# Patient Record
Sex: Female | Born: 1970
Health system: Southern US, Community
[De-identification: ages and names within clinical notes are randomized; demographics above are authoritative.]

## PROBLEM LIST (undated history)

## (undated) DIAGNOSIS — I10 Essential (primary) hypertension: Secondary | ICD-10-CM

## (undated) DIAGNOSIS — J45909 Unspecified asthma, uncomplicated: Secondary | ICD-10-CM

## (undated) DIAGNOSIS — J309 Allergic rhinitis, unspecified: Secondary | ICD-10-CM

## (undated) DIAGNOSIS — D509 Iron deficiency anemia, unspecified: Secondary | ICD-10-CM

## (undated) DIAGNOSIS — E876 Hypokalemia: Secondary | ICD-10-CM

## (undated) DIAGNOSIS — E7800 Pure hypercholesterolemia, unspecified: Secondary | ICD-10-CM

## (undated) DIAGNOSIS — E119 Type 2 diabetes mellitus without complications: Secondary | ICD-10-CM

## (undated) HISTORY — DX: Iron deficiency anemia, unspecified: D50.9

## (undated) HISTORY — DX: Type 2 diabetes mellitus without complications: E11.9

## (undated) HISTORY — DX: Hypokalemia: E87.6

## (undated) HISTORY — DX: Unspecified asthma, uncomplicated: J45.909

## (undated) HISTORY — DX: Allergic rhinitis, unspecified: J30.9

---
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
## 1994-08-12 HISTORY — PX: TUBAL LIGATION: SHX77

## 1998-06-13 ENCOUNTER — Encounter: Payer: Self-pay | Admitting: Emergency Medicine

## 1998-06-13 ENCOUNTER — Emergency Department (HOSPITAL_COMMUNITY): Admission: EM | Admit: 1998-06-13 | Discharge: 1998-06-13 | Payer: Self-pay | Admitting: Emergency Medicine

## 1998-08-12 ENCOUNTER — Emergency Department (HOSPITAL_COMMUNITY): Admission: EM | Admit: 1998-08-12 | Discharge: 1998-08-12 | Payer: Self-pay | Admitting: Emergency Medicine

## 2000-03-21 ENCOUNTER — Other Ambulatory Visit: Admission: RE | Admit: 2000-03-21 | Discharge: 2000-03-21 | Payer: Self-pay | Admitting: Internal Medicine

## 2001-04-01 ENCOUNTER — Other Ambulatory Visit: Admission: RE | Admit: 2001-04-01 | Discharge: 2001-04-01 | Payer: Self-pay | Admitting: Cardiology

## 2002-04-22 ENCOUNTER — Other Ambulatory Visit: Admission: RE | Admit: 2002-04-22 | Discharge: 2002-04-22 | Payer: Self-pay | Admitting: Internal Medicine

## 2003-10-03 ENCOUNTER — Other Ambulatory Visit: Admission: RE | Admit: 2003-10-03 | Discharge: 2003-10-03 | Payer: Self-pay | Admitting: Internal Medicine

## 2004-11-03 ENCOUNTER — Emergency Department (HOSPITAL_COMMUNITY): Admission: EM | Admit: 2004-11-03 | Discharge: 2004-11-04 | Payer: Self-pay | Admitting: Emergency Medicine

## 2004-11-07 ENCOUNTER — Other Ambulatory Visit: Admission: RE | Admit: 2004-11-07 | Discharge: 2004-11-07 | Payer: Self-pay | Admitting: Internal Medicine

## 2004-11-16 ENCOUNTER — Emergency Department (HOSPITAL_COMMUNITY): Admission: EM | Admit: 2004-11-16 | Discharge: 2004-11-16 | Payer: Self-pay | Admitting: Family Medicine

## 2005-12-06 ENCOUNTER — Other Ambulatory Visit: Admission: RE | Admit: 2005-12-06 | Discharge: 2005-12-06 | Payer: Self-pay | Admitting: Internal Medicine

## 2007-02-24 ENCOUNTER — Other Ambulatory Visit: Admission: RE | Admit: 2007-02-24 | Discharge: 2007-02-24 | Payer: Self-pay | Admitting: Internal Medicine

## 2007-05-09 ENCOUNTER — Emergency Department (HOSPITAL_COMMUNITY): Admission: EM | Admit: 2007-05-09 | Discharge: 2007-05-09 | Payer: Self-pay | Admitting: Emergency Medicine

## 2009-02-05 ENCOUNTER — Emergency Department (HOSPITAL_COMMUNITY): Admission: EM | Admit: 2009-02-05 | Discharge: 2009-02-05 | Payer: Self-pay | Admitting: Family Medicine

## 2009-02-11 ENCOUNTER — Emergency Department (HOSPITAL_COMMUNITY): Admission: EM | Admit: 2009-02-11 | Discharge: 2009-02-11 | Payer: Self-pay | Admitting: Emergency Medicine

## 2009-03-03 ENCOUNTER — Ambulatory Visit (HOSPITAL_COMMUNITY): Admission: RE | Admit: 2009-03-03 | Discharge: 2009-03-03 | Payer: Self-pay | Admitting: Internal Medicine

## 2009-11-10 ENCOUNTER — Other Ambulatory Visit: Admission: RE | Admit: 2009-11-10 | Discharge: 2009-11-10 | Payer: Self-pay | Admitting: Internal Medicine

## 2009-11-28 ENCOUNTER — Ambulatory Visit (HOSPITAL_COMMUNITY): Admission: RE | Admit: 2009-11-28 | Discharge: 2009-11-28 | Payer: Self-pay | Admitting: Internal Medicine

## 2009-12-01 ENCOUNTER — Emergency Department (HOSPITAL_COMMUNITY): Admission: EM | Admit: 2009-12-01 | Discharge: 2009-12-01 | Payer: Self-pay | Admitting: Emergency Medicine

## 2010-11-18 LAB — URINALYSIS, ROUTINE W REFLEX MICROSCOPIC
Bilirubin Urine: NEGATIVE
Glucose, UA: NEGATIVE mg/dL
Hgb urine dipstick: NEGATIVE
Ketones, ur: NEGATIVE mg/dL
Nitrite: NEGATIVE
Protein, ur: NEGATIVE mg/dL
Specific Gravity, Urine: 1.028 (ref 1.005–1.030)
Urobilinogen, UA: 1 mg/dL (ref 0.0–1.0)
pH: 7 (ref 5.0–8.0)

## 2011-05-23 LAB — CULTURE, ROUTINE-ABSCESS

## 2012-01-11 ENCOUNTER — Encounter (HOSPITAL_COMMUNITY): Payer: Self-pay

## 2012-01-11 ENCOUNTER — Emergency Department (HOSPITAL_COMMUNITY): Payer: Self-pay

## 2012-01-11 ENCOUNTER — Emergency Department (HOSPITAL_COMMUNITY)
Admission: EM | Admit: 2012-01-11 | Discharge: 2012-01-11 | Disposition: A | Discharge status: Home / Self Care | Payer: Self-pay | Attending: Emergency Medicine | Admitting: Emergency Medicine

## 2012-01-11 DIAGNOSIS — R079 Chest pain, unspecified: Secondary | ICD-10-CM | POA: Insufficient documentation

## 2012-01-11 DIAGNOSIS — R0789 Other chest pain: Secondary | ICD-10-CM

## 2012-01-11 DIAGNOSIS — E7800 Pure hypercholesterolemia, unspecified: Secondary | ICD-10-CM | POA: Insufficient documentation

## 2012-01-11 DIAGNOSIS — M79609 Pain in unspecified limb: Secondary | ICD-10-CM | POA: Insufficient documentation

## 2012-01-11 DIAGNOSIS — I10 Essential (primary) hypertension: Secondary | ICD-10-CM | POA: Insufficient documentation

## 2012-01-11 DIAGNOSIS — K219 Gastro-esophageal reflux disease without esophagitis: Secondary | ICD-10-CM | POA: Insufficient documentation

## 2012-01-11 HISTORY — DX: Essential (primary) hypertension: I10

## 2012-01-11 HISTORY — DX: Pure hypercholesterolemia, unspecified: E78.00

## 2012-01-11 LAB — BASIC METABOLIC PANEL
CO2: 27 mEq/L (ref 19–32)
Calcium: 9 mg/dL (ref 8.4–10.5)
Chloride: 98 mEq/L (ref 96–112)
Creatinine, Ser: 0.7 mg/dL (ref 0.50–1.10)
GFR calc Af Amer: >90 mL/min (ref >90)
Sodium: 136 mEq/L (ref 135–145)

## 2012-01-11 LAB — DIFFERENTIAL
Basophils Absolute: 0 10*3/uL (ref 0.0–0.1)
Lymphocytes Relative: 44 % (ref 12–46)
Lymphs Abs: 3.2 10*3/uL (ref 0.7–4.0)
Neutro Abs: 3.4 10*3/uL (ref 1.7–7.7)

## 2012-01-11 LAB — CBC
MCV: 76.7 fL — ABNORMAL LOW (ref 78.0–100.0)
Platelets: 269 10*3/uL (ref 150–400)
RBC: 3.91 MIL/uL (ref 3.87–5.11)
RDW: 15.6 % — ABNORMAL HIGH (ref 11.5–15.5)
WBC: 7.2 10*3/uL (ref 4.0–10.5)

## 2012-01-11 LAB — TROPONIN I: Troponin I: <0.3 ng/mL (ref <0.30)

## 2012-01-11 MED ORDER — ESOMEPRAZOLE MAGNESIUM 40 MG PO CPDR: 40.0000 mg | DELAYED_RELEASE_CAPSULE | Freq: Every day | ORAL | Status: DC

## 2012-01-11 MED ORDER — ASPIRIN 325 MG PO TABS
325.0000 mg | ORAL_TABLET | ORAL | Status: AC
  Administered 2012-01-11: 325 mg via ORAL
  Filled 2012-01-11: qty 1

## 2012-01-11 MED ORDER — POTASSIUM CHLORIDE CRYS ER 20 MEQ PO TBCR
40.0000 meq | EXTENDED_RELEASE_TABLET | ORAL | Status: AC
  Administered 2012-01-11: 40 meq via ORAL
  Filled 2012-01-11: qty 1

## 2012-01-11 MED ORDER — GI COCKTAIL ~~LOC~~
30.0000 mL | Freq: Once | ORAL | Status: AC
  Administered 2012-01-11: 30 mL via ORAL
  Filled 2012-01-11: qty 30

## 2012-01-11 NOTE — ED Notes (Signed)
Patient presented to ED with chest pain(central) extending to the left arm from this morning.No change in pain scale from morning.No history of cp.At present looks comfortable.

## 2012-01-11 NOTE — ED Provider Notes (Signed)
History     CSN: [id number]  Arrival date & time 01/11/12  1903   First MD Initiated Contact with Patient 01/11/12 2117      Chief Complaint  Patient presents with  . Chest Pain  . Extremity Pain    (Consider location/radiation/quality/duration/timing/severity/associated sxs/prior treatment) Patient is a 41 y.o. female presenting with chest pain and extremity pain. The history is provided by the patient.  Chest Pain The chest pain began 6 - 12 hours ago. Chest pain occurs constantly. The chest pain is unchanged. Associated with: nothing. At its most intense, the pain is at 7/10. The pain is currently at 7/10. The severity of the pain is mild. The quality of the pain is described as burning. The pain radiates to the left arm. Exacerbated by: nothing. Pertinent negatives for primary symptoms include no fever, no fatigue, no shortness of breath, no cough, no abdominal pain, no nausea, no vomiting and no dizziness. She tried nothing for the symptoms. Risk factors: HTN, HLP.    Extremity Pain Associated symptoms include chest pain. Pertinent negatives include no abdominal pain, congestion, coughing, fatigue, fever, headaches, nausea, neck pain or vomiting.    Past Medical History  Diagnosis Date  . Hypertension   . High cholesterol     No past surgical history on file.  No family history on file.  History  Substance Use Topics  . Smoking status: Never Smoker   . Smokeless tobacco: Not on file  . Alcohol Use: No    OB History    Grav Para Term Preterm Abortions TAB SAB Ect Mult Living                  Review of Systems  Constitutional: Negative for fever and fatigue.  HENT: Negative for congestion, drooling and neck pain.   Eyes: Negative for pain.  Respiratory: Negative for cough and shortness of breath.   Cardiovascular: Positive for chest pain.  Gastrointestinal: Negative for nausea, vomiting, abdominal pain and diarrhea.  Genitourinary: Negative for dysuria and  hematuria.  Musculoskeletal: Negative for back pain and gait problem.  Skin: Negative for color change.  Neurological: Negative for dizziness and headaches.  Hematological: Negative for adenopathy.  Psychiatric/Behavioral: Negative for behavioral problems.  All other systems reviewed and are negative.    Allergies  Review of patient's allergies indicates no known allergies.  Home Medications   Current Outpatient Rx  Name Route Sig Dispense Refill  . BENAZEPRIL-HYDROCHLOROTHIAZIDE 20-25 MG PO TABS Oral Take 1 tablet by mouth daily.    Marland Kitchen ESOMEPRAZOLE MAGNESIUM 40 MG PO CPDR Oral Take 40 mg by mouth daily before breakfast.    . LEVALBUTEROL TARTRATE 45 MCG/ACT IN AERO Inhalation Inhale 2 puffs into the lungs every 4 (four) hours as needed. For shortness of breath    . PRAVASTATIN SODIUM 40 MG PO TABS Oral Take 40 mg by mouth daily.      BP 123/54  Pulse 85  Temp 98.4 F (36.9 C)  Resp 20  SpO2 100%  LMP 01/08/2012  Physical Exam  Constitutional: She is oriented to person, place, and time. She appears well-developed and well-nourished.  HENT:  Head: Normocephalic.  Mouth/Throat: No oropharyngeal exudate.  Eyes: Conjunctivae and EOM are normal. Pupils are equal, round, and reactive to light.  Neck: Normal range of motion. Neck supple.  Cardiovascular: Normal rate, regular rhythm, normal heart sounds and intact distal pulses.  Exam reveals no gallop and no friction rub.   No murmur heard. Pulmonary/Chest: Effort  normal and breath sounds normal. No respiratory distress. She has no wheezes.  Abdominal: Soft. Bowel sounds are normal. There is no tenderness.  Musculoskeletal: Normal range of motion. She exhibits no edema and no tenderness.  Neurological: She is alert and oriented to person, place, and time.  Skin: Skin is warm and dry.  Psychiatric: She has a normal mood and affect. Her behavior is normal.    ED Course  Procedures (including critical care time)  Labs Reviewed   CBC - Abnormal; Notable for the following:    Hemoglobin 9.7 (*)    HCT 30.0 (*)    MCV 76.7 (*)    MCH 24.8 (*)    RDW 15.6 (*)    All other components within normal limits  DIFFERENTIAL  BASIC METABOLIC PANEL  TROPONIN I   No results found.   No diagnosis found.   Date: 01/12/2012  Rate: 90  Rhythm: normal sinus rhythm  QRS Axis: right  Intervals: QT prolonged  ST/T Wave abnormalities: normal  Conduction Disutrbances:none  Narrative Interpretation: No ST or T wave changes cw ischemia  Old EKG Reviewed: unchanged    MDM  10:16 PM 41 y.o. female w HLP, HTN pw retrosternal chest burning radiating to her left arm since this morning. Pt is AFVSS here, appears well on exam. Low suspicion for MI based on clinical exam and RF's. Will get labs, gi cocktail.   Initial troponin negative 12 hrs after onset of pain. Pt had relief w/ gi cocktail, will not pursue delta trop as pt now feeling better and sx likely related to reflux.   12:31 AM:  I have discussed the diagnosis/risks/treatment options with the patient and believe the pt to be eligible for discharge home to follow-up with pcp to discuss reflux and possible future outpt stress test if needed. We also discussed returning to the ED immediately if new or worsening sx occur. We discussed the sx which are most concerning (e.g., worsening pain, sob) that necessitate immediate return. Any new prescriptions provided to the patient are listed below.  New Prescriptions   ESOMEPRAZOLE (NEXIUM) 40 MG CAPSULE    Take 1 capsule (40 mg total) by mouth daily.    Clinical Impression 1. GERD (gastroesophageal reflux disease)   2. Atypical chest pain          Purvis Sheffield, MD 01/12/12 [id number]

## 2012-01-11 NOTE — ED Notes (Signed)
Pt complains of chest pain and left arm pain all day today.

## 2012-01-11 NOTE — Discharge Instructions (Signed)
Chest Pain (Nonspecific) It is often hard to give a specific diagnosis for the cause of chest pain. There is always a chance that your pain could be related to something serious, such as a heart attack or a blood clot in the lungs. You need to follow up with your caregiver for further evaluation. CAUSES   Heartburn.   Pneumonia or bronchitis.   Anxiety or stress.   Inflammation around your heart (pericarditis) or lung (pleuritis or pleurisy).   A blood clot in the lung.   A collapsed lung (pneumothorax). It can develop suddenly on its own (spontaneous pneumothorax) or from injury (trauma) to the chest.   Shingles infection (herpes zoster virus).  The chest wall is composed of bones, muscles, and cartilage. Any of these can be the source of the pain.  The bones can be bruised by injury.   The muscles or cartilage can be strained by coughing or overwork.   The cartilage can be affected by inflammation and become sore (costochondritis).  DIAGNOSIS  Lab tests or other studies, such as X-rays, electrocardiography, stress testing, or cardiac imaging, may be needed to find the cause of your pain.  TREATMENT   Treatment depends on what may be causing your chest pain. Treatment may include:   Acid blockers for heartburn.   Anti-inflammatory medicine.   Pain medicine for inflammatory conditions.   Antibiotics if an infection is present.   You may be advised to change lifestyle habits. This includes stopping smoking and avoiding alcohol, caffeine, and chocolate.   You may be advised to keep your head raised (elevated) when sleeping. This reduces the chance of acid going backward from your stomach into your esophagus.   Most of the time, nonspecific chest pain will improve within 2 to 3 days with rest and mild pain medicine.  HOME CARE INSTRUCTIONS   If antibiotics were prescribed, take your antibiotics as directed. Finish them even if you start to feel better.   For the next few  days, avoid physical activities that bring on chest pain. Continue physical activities as directed.   Do not smoke.   Avoid drinking alcohol.   Only take over-the-counter or prescription medicine for pain, discomfort, or fever as directed by your caregiver.   Follow your caregiver's suggestions for further testing if your chest pain does not go away.   Keep any follow-up appointments you made. If you do not go to an appointment, you could develop lasting (chronic) problems with pain. If there is any problem keeping an appointment, you must call to reschedule.  SEEK MEDICAL CARE IF:   You think you are having problems from the medicine you are taking. Read your medicine instructions carefully.   Your chest pain does not go away, even after treatment.   You develop a rash with blisters on your chest.  SEEK IMMEDIATE MEDICAL CARE IF:   You have increased chest pain or pain that spreads to your arm, neck, jaw, back, or abdomen.   You develop shortness of breath, an increasing cough, or you are coughing up blood.   You have severe back or abdominal pain, feel nauseous, or vomit.   You develop severe weakness, fainting, or chills.   You have a fever.  THIS IS AN EMERGENCY. Do not wait to see if the pain will go away. Get medical help at once. Call your local emergency services (911 in [place] Do not drive yourself to the hospital. MAKE SURE YOU:   Understand these instructions.     Will watch your condition.   Will get help right away if you are not doing well or get worse.  Document Released: 05/08/2005 Document Revised: 07/18/2011 Document Reviewed: 03/03/2008 Exit[employer] Patient Information 2012 [employer]. 

## 2012-01-12 NOTE — ED Provider Notes (Signed)
I saw and evaluated the patient, reviewed the resident's note and I agree with the findings and plan. I saw EKG and agree with residents interpretation. Pt with atypical chest pain which sounds most like GERD and no associated sx.  PERC neg and well appearing.  Normal EKG And enzymes.  Improved with gi cocktail.  Gwyneth Sprout, MD 01/12/12 1351

## 2013-04-09 ENCOUNTER — Encounter: Payer: Self-pay | Admitting: Cardiovascular Disease

## 2013-04-13 ENCOUNTER — Encounter: Payer: Self-pay | Admitting: Gastroenterology

## 2013-04-19 ENCOUNTER — Other Ambulatory Visit: Payer: 59

## 2013-04-19 ENCOUNTER — Encounter: Payer: Self-pay | Admitting: Gastroenterology

## 2013-04-19 ENCOUNTER — Ambulatory Visit (INDEPENDENT_AMBULATORY_CARE_PROVIDER_SITE_OTHER): Payer: 59 | Admitting: Gastroenterology

## 2013-04-19 ENCOUNTER — Encounter: Payer: Self-pay | Admitting: Cardiovascular Disease

## 2013-04-19 ENCOUNTER — Other Ambulatory Visit: Payer: Self-pay | Admitting: *Deleted

## 2013-04-19 VITALS — BP 114/80 | HR 68 | Ht 59.0 in | Wt 199.5 lb

## 2013-04-19 DIAGNOSIS — D509 Iron deficiency anemia, unspecified: Secondary | ICD-10-CM

## 2013-04-19 MED ORDER — FERROUS SULFATE DRIED 200 (65 FE) MG PO TABS: 1.0000 | ORAL_TABLET | Freq: Two times a day (BID) | ORAL | Status: DC

## 2013-04-19 NOTE — Patient Instructions (Signed)
  Your physician has requested that you go to the basement for lab work before leaving today.  Please come back May 19, 2013 for lab work. Lab is open from 7:30 am to 5:30 pm.  Please follow up with Dr. Arlyce Dice in 4-6 weeks. ____________________________________________________________________________________                                               We are excited to introduce MyChart, a new best-in-class service that provides you online access to important information in your electronic medical record. We want to make it easier for you to view your health information - all in one secure location - when and where you need it. We expect MyChart will enhance the quality of care and service we provide.  When you register for MyChart, you can:    View your test results.    Request appointments and receive appointment reminders via email.    Request medication renewals.    View your medical history, allergies, medications and immunizations.    Communicate with your physician's office through a password-protected site.    Conveniently print information such as your medication lists.  To find out if MyChart is right for you, please talk to a member of our clinical staff today. We will gladly answer your questions about this free health and wellness tool.  If you are age 89 or older and want a member of your family to have access to your record, you must provide written consent by completing a proxy form available at our office. Please speak to our clinical staff about guidelines regarding accounts for patients younger than age 39.  As you activate your MyChart account and need any technical assistance, please call the MyChart technical support line at [phone]) or email your question to [email or web address]. If you email your question(s), please include your name, a return phone number and the best time to reach you.  If you have non-urgent health-related  questions, you can send a message to our office through MyChart at [email or web address]. If you have a medical emergency, call 911.  Thank you for using MyChart as your new health and wellness resource!   MyChart licensed from [employer],  [id number]. Patents Pending.

## 2013-04-19 NOTE — Assessment & Plan Note (Addendum)
Iron deficiency anemia is likely from menstrual blood loss.  Chronic GI bleeding should be ruled out.  Microcytic anemia from a hemoglobinopathy is less likely.  Recommendations #1 serial Hemoccults #2 hemoglobin electrophoresis #3 begin iron supplementation #4 CBC one month

## 2013-04-19 NOTE — Progress Notes (Signed)
History of Present Illness: 42 year old Afro-American female referred at the request of Lucky Cowboy, MD for evaluation of anemia.  She's been told to be anemic in the past.  In July, 2014 hemoglobin was 9 and MCV 72; % iron saturation was 6%. Patient has no GI complaints including change of bowel habits, abdominal pain, melena or hematochezia.  She claims that she has heavy periods for the first 2 days of her menstrual..  She believes that other family members were told they were anemic.  She is on no regular gastric irritants including nonsteroidals.  She takes Nexium occasionally for pyrosis.    Past Medical History  Diagnosis Date  . Hypertension   . High cholesterol   . Asthma   . DM (diabetes mellitus)     borderline  . Low blood potassium    Past Surgical History  Procedure Laterality Date  . Cesarean section      x 4  . Umbilical hernia repair     family history includes Breast cancer in her maternal aunt; Diabetes in her maternal aunt and mother; Heart disease in her maternal grandmother. Current Outpatient Prescriptions  Medication Sig Dispense Refill  . benazepril-hydrochlorthiazide (LOTENSIN HCT) 20-25 MG per tablet Take 1 tablet by mouth daily.      . ergocalciferol (VITAMIN D2) 50000 UNITS capsule Take 50,000 Units by mouth. Monday, Wednesday and Friday      . esomeprazole (NEXIUM) 40 MG capsule Take 40 mg by mouth as needed.       . metFORMIN (GLUCOPHAGE) 500 MG tablet Take 500 mg by mouth daily.      . potassium chloride (K-DUR,KLOR-CON) 10 MEQ tablet Take 10 mEq by mouth daily.      . pravastatin (PRAVACHOL) 40 MG tablet Take 40 mg by mouth daily.      Marland Kitchen levalbuterol (XOPENEX HFA) 45 MCG/ACT inhaler Inhale 2 puffs into the lungs every 4 (four) hours as needed. For shortness of breath       No current facility-administered medications for this visit.   Allergies as of 04/19/2013  . (No Known Allergies)    reports that she has never smoked. She has never used  smokeless tobacco. She reports that she does not drink alcohol or use illicit drugs.     Review of Systems: Pertinent positive and negative review of systems were noted in the above HPI section. All other review of systems were otherwise negative.  Vital signs were reviewed in today's medical record Physical Exam: General: She is an obese female in no acute distress Skin: anicteric Head: Normocephalic and atraumatic Eyes:  sclerae anicteric, EOMI Ears: Normal auditory acuity Mouth: No deformity or lesions Neck: Supple, no masses or thyromegaly Lungs: Clear throughout to auscultation Heart: Regular rate and rhythm; no murmurs, rubs or bruits Abdomen: Soft, non tender and non distended. No masses, hepatosplenomegaly or hernias noted. Normal Bowel sounds Rectal:deferred Musculoskeletal: Symmetrical with no gross deformities  Skin: No lesions on visible extremities Pulses:  Normal pulses noted Extremities: No clubbing, cyanosis, edema or deformities noted Neurological: Alert oriented x 4, grossly nonfocal Cervical Nodes:  No significant cervical adenopathy Inguinal Nodes: No significant inguinal adenopathy Psychological:  Alert and cooperative. Normal mood and affect

## 2013-04-19 NOTE — Progress Notes (Signed)
No show for appt. cdm  

## 2013-04-21 LAB — HEMOGLOBINOPATHY EVALUATION
Hgb A2 Quant: 1.2 % — ABNORMAL LOW (ref 2.2–3.2)
Hgb A: 98.8 % — ABNORMAL HIGH (ref 96.8–97.8)
Hgb F Quant: 0 % (ref 0.0–2.0)
Hgb S Quant: 0 %

## 2013-05-04 ENCOUNTER — Encounter: Payer: Self-pay | Admitting: Gastroenterology

## 2013-05-06 ENCOUNTER — Encounter: Payer: Self-pay | Admitting: Cardiovascular Disease

## 2013-05-24 ENCOUNTER — Ambulatory Visit: Payer: 59 | Admitting: Gastroenterology

## 2013-06-15 ENCOUNTER — Encounter: Payer: Self-pay | Admitting: Cardiovascular Disease

## 2013-06-15 ENCOUNTER — Encounter (INDEPENDENT_AMBULATORY_CARE_PROVIDER_SITE_OTHER): Payer: Self-pay

## 2013-06-15 ENCOUNTER — Ambulatory Visit (INDEPENDENT_AMBULATORY_CARE_PROVIDER_SITE_OTHER): Payer: 59 | Admitting: Cardiovascular Disease

## 2013-06-15 VITALS — BP 150/70 | HR 77 | Ht 59.0 in | Wt 202.0 lb

## 2013-06-15 DIAGNOSIS — R0600 Dyspnea, unspecified: Secondary | ICD-10-CM

## 2013-06-15 DIAGNOSIS — R0609 Other forms of dyspnea: Secondary | ICD-10-CM

## 2013-06-15 DIAGNOSIS — R0989 Other specified symptoms and signs involving the circulatory and respiratory systems: Secondary | ICD-10-CM

## 2013-06-15 DIAGNOSIS — D509 Iron deficiency anemia, unspecified: Secondary | ICD-10-CM

## 2013-06-15 NOTE — Progress Notes (Signed)
History of Present Illness: 42yo female with history of HTN, hyperlipidemia, asthma, borderline DM, anemia who is here today as a new patient for evaluation of SOB. She has had recent GI evaluation per Dr. Arlyce Dice with plans for iron supplementation and repeat CBC this month. She tells me that she has noticed dyspnea for several months. This is with exertion. No recent weight gain or lower ext edema. No chest pain. Overall feels well.   Primary Care Physician: Dr. Oneta Rack  Past Medical History  Diagnosis Date  . Hypertension   . High cholesterol   . Asthma   . DM (diabetes mellitus)     borderline  . Low blood potassium   . Iron deficiency anemia   . Allergic rhinitis     Past Surgical History  Procedure Laterality Date  . Cesarean section      x 4  . Umbilical hernia repair  1977  . Tubal ligation  1996    Current Outpatient Prescriptions  Medication Sig Dispense Refill  . benazepril-hydrochlorthiazide (LOTENSIN HCT) 20-25 MG per tablet Take 1 tablet by mouth daily.      . ergocalciferol (VITAMIN D2) 50000 UNITS capsule Take 50,000 Units by mouth. Monday, Wednesday and Friday      . esomeprazole (NEXIUM) 40 MG capsule Take 40 mg by mouth as needed.       . Ferrous Sulfate Dried (FEOSOL) 200 (65 FE) MG TABS Take 1 tablet by mouth 2 (two) times daily.  60 tablet  3  . levalbuterol (XOPENEX HFA) 45 MCG/ACT inhaler Inhale 2 puffs into the lungs every 4 (four) hours as needed. For shortness of breath      . metFORMIN (GLUCOPHAGE) 500 MG tablet Take 500 mg by mouth daily.      . potassium chloride (K-DUR,KLOR-CON) 10 MEQ tablet Take 10 mEq by mouth daily.      . pravastatin (PRAVACHOL) 40 MG tablet Take 40 mg by mouth daily.       No current facility-administered medications for this visit.    No Known Allergies  History   Social History  . Marital Status: Single    Spouse Name: N/A    Number of Children: 4  . Years of Education: N/A   Occupational History  .  shipping    Social History Main Topics  . Smoking status: Never Smoker   . Smokeless tobacco: Never Used  . Alcohol Use: No  . Drug Use: No  . Sexual Activity: Not on file   Other Topics Concern  . Not on file   Social History Narrative  . No narrative on file    Family History  Problem Relation Age of Onset  . Breast cancer Maternal Aunt   . Diabetes Mother   . Diabetes Maternal Aunt   . Heart disease Maternal Grandmother     great grandmother  . Stroke Mother   . CAD Neg Hx     Review of Systems:  As stated in the HPI and otherwise negative.   BP 150/70  Pulse 77  Ht 4\' 11"  (1.499 m)  Wt 202 lb (91.627 kg)  BMI 40.78 kg/m2  Physical Examination: General: Well developed, well nourished, NAD HEENT: OP clear, mucus membranes moist SKIN: warm, dry. No rashes. Neuro: No focal deficits Musculoskeletal: Muscle strength 5/5 all ext Psychiatric: Mood and affect normal Neck: No JVD, no carotid bruits, no thyromegaly, no lymphadenopathy. Lungs:Clear bilaterally, no wheezes, rhonci, crackles Cardiovascular: Regular rate and rhythm. No murmurs,  gallops or rubs. Abdomen:Soft. Bowel sounds present. Non-tender.  Extremities: No lower extremity edema. Pulses are 2 + in the bilateral DP/PT.  EKG:NSR, rate 67 bpm.   Assessment and Plan:   1. Dyspnea: Likely related to her anemia. Cardiac exam is normal. EKG is normal. Will arrange echo to assess LVEF and exclude structural heart disease. No chest pain to suggest the need for an ischemic evaluation   2. Anemia: Workup per primary care and GI. She is on iron replacement.

## 2013-06-15 NOTE — Patient Instructions (Signed)
Your physician recommends that you schedule a follow-up appointment in:  About 6 weeks   Your physician has requested that you have an echocardiogram. Echocardiography is a painless test that uses sound waves to create images of your heart. It provides your doctor with information about the size and shape of your heart and how well your heart's chambers and valves are working. This procedure takes approximately one hour. There are no restrictions for this procedure.

## 2013-06-29 ENCOUNTER — Other Ambulatory Visit: Payer: Self-pay

## 2013-06-29 ENCOUNTER — Ambulatory Visit (HOSPITAL_COMMUNITY): Payer: 59 | Attending: Cardiovascular Disease | Admitting: Radiology

## 2013-06-29 DIAGNOSIS — R0600 Dyspnea, unspecified: Secondary | ICD-10-CM

## 2013-06-29 DIAGNOSIS — I10 Essential (primary) hypertension: Secondary | ICD-10-CM | POA: Insufficient documentation

## 2013-06-29 DIAGNOSIS — E785 Hyperlipidemia, unspecified: Secondary | ICD-10-CM | POA: Insufficient documentation

## 2013-06-29 DIAGNOSIS — R7309 Other abnormal glucose: Secondary | ICD-10-CM | POA: Insufficient documentation

## 2013-06-29 DIAGNOSIS — R0602 Shortness of breath: Secondary | ICD-10-CM | POA: Insufficient documentation

## 2013-06-29 NOTE — Progress Notes (Signed)
Echocardiogram performed.  

## 2013-06-30 ENCOUNTER — Telehealth: Payer: Self-pay | Admitting: Cardiovascular Disease

## 2013-06-30 NOTE — Telephone Encounter (Signed)
Follow Up  ° °Pt returned the call  °

## 2013-06-30 NOTE — Telephone Encounter (Signed)
Spoke with pt and reviewed echo results with her.  

## 2013-07-06 ENCOUNTER — Institutional Professional Consult (permissible substitution): Payer: Self-pay | Admitting: Cardiovascular Disease

## 2013-07-06 ENCOUNTER — Ambulatory Visit: Payer: 59 | Admitting: Gastroenterology

## 2013-07-20 ENCOUNTER — Ambulatory Visit: Payer: 59 | Admitting: Cardiovascular Disease

## 2013-08-20 ENCOUNTER — Ambulatory Visit: Payer: 59 | Admitting: Gastroenterology

## 2013-09-02 ENCOUNTER — Other Ambulatory Visit: Payer: Self-pay | Admitting: Emergency Medicine

## 2013-09-07 ENCOUNTER — Ambulatory Visit (INDEPENDENT_AMBULATORY_CARE_PROVIDER_SITE_OTHER): Payer: 59 | Admitting: Internal Medicine

## 2013-09-07 ENCOUNTER — Encounter: Payer: Self-pay | Admitting: Internal Medicine

## 2013-09-07 VITALS — BP 140/86 | HR 60 | Temp 97.9°F | Resp 16 | Wt 202.0 lb

## 2013-09-07 DIAGNOSIS — Z79899 Other long term (current) drug therapy: Secondary | ICD-10-CM

## 2013-09-07 DIAGNOSIS — E559 Vitamin D deficiency, unspecified: Secondary | ICD-10-CM

## 2013-09-07 DIAGNOSIS — J0410 Acute tracheitis without obstruction: Secondary | ICD-10-CM

## 2013-09-07 DIAGNOSIS — I10 Essential (primary) hypertension: Secondary | ICD-10-CM | POA: Insufficient documentation

## 2013-09-07 DIAGNOSIS — E782 Mixed hyperlipidemia: Secondary | ICD-10-CM

## 2013-09-07 DIAGNOSIS — R7309 Other abnormal glucose: Secondary | ICD-10-CM | POA: Insufficient documentation

## 2013-09-07 DIAGNOSIS — E119 Type 2 diabetes mellitus without complications: Secondary | ICD-10-CM

## 2013-09-07 LAB — CBC WITH DIFFERENTIAL/PLATELET
BASOS ABS: 0 10*3/uL (ref 0.0–0.1)
Basophils Relative: 0 % (ref 0–1)
Eosinophils Absolute: 0.1 10*3/uL (ref 0.0–0.7)
Eosinophils Relative: 2 % (ref 0–5)
HEMATOCRIT: 28.6 % — AB (ref 36.0–46.0)
HEMOGLOBIN: 9 g/dL — AB (ref 12.0–15.0)
LYMPHS PCT: 39 % (ref 12–46)
Lymphs Abs: 2.4 10*3/uL (ref 0.7–4.0)
MCH: 21.8 pg — ABNORMAL LOW (ref 26.0–34.0)
MCHC: 31.5 g/dL (ref 30.0–36.0)
MCV: 69.2 fL — ABNORMAL LOW (ref 78.0–100.0)
MONO ABS: 0.5 10*3/uL (ref 0.1–1.0)
MONOS PCT: 7 % (ref 3–12)
NEUTROS ABS: 3.3 10*3/uL (ref 1.7–7.7)
Neutrophils Relative %: 52 % (ref 43–77)
Platelets: 429 10*3/uL — ABNORMAL HIGH (ref 150–400)
RBC: 4.13 MIL/uL (ref 3.87–5.11)
RDW: 18.3 % — ABNORMAL HIGH (ref 11.5–15.5)
WBC: 6.3 10*3/uL (ref 4.0–10.5)

## 2013-09-07 MED ORDER — HYDROCODONE-ACETAMINOPHEN 5-325 MG PO TABS: ORAL_TABLET | ORAL | Status: DC

## 2013-09-07 MED ORDER — LEVALBUTEROL TARTRATE 45 MCG/ACT IN AERO: 2.0000 | INHALATION_SPRAY | RESPIRATORY_TRACT | Status: DC | PRN

## 2013-09-07 MED ORDER — PREDNISONE 10 MG PO TABS: 10.0000 mg | ORAL_TABLET | ORAL | Status: DC

## 2013-09-07 MED ORDER — AZITHROMYCIN 250 MG PO TABS: ORAL_TABLET | ORAL | Status: AC

## 2013-09-07 MED ORDER — METFORMIN HCL ER 500 MG PO TB24: ORAL_TABLET | ORAL | Status: DC

## 2013-09-07 NOTE — Patient Instructions (Signed)
Bronchitis Bronchitis is inflammation of the airways that extend from the windpipe into the lungs (bronchi). The inflammation often causes mucus to develop, which leads to a cough. If the inflammation becomes severe, it may cause shortness of breath. CAUSES  Bronchitis may be caused by:   Viral infections.   Bacteria.   Cigarette smoke.   Allergens, pollutants, and other irritants.  SIGNS AND SYMPTOMS  The most common symptom of bronchitis is a frequent cough that produces mucus. Other symptoms include:  Fever.   Body aches.   Chest congestion.   Chills.   Shortness of breath.   Sore throat.  DIAGNOSIS  Bronchitis is usually diagnosed through a medical history and physical exam. Tests, such as chest X-rays, are sometimes done to rule out other conditions.  TREATMENT  You may need to avoid contact with whatever caused the problem (smoking, for example). Medicines are sometimes needed. These may include:  Antibiotics. These may be prescribed if the condition is caused by bacteria.  Cough suppressants. These may be prescribed for relief of cough symptoms.   Inhaled medicines. These may be prescribed to help open your airways and make it easier for you to breathe.   Steroid medicines. These may be prescribed for those with recurrent (chronic) bronchitis. HOME CARE INSTRUCTIONS  Get plenty of rest.   Drink enough fluids to keep your urine clear or pale yellow (unless you have a medical condition that requires fluid restriction). Increasing fluids may help thin your secretions and will prevent dehydration.   Only take over-the-counter or prescription medicines as directed by your health care provider.  Only take antibiotics as directed. Make sure you finish them even if you start to feel better.  Avoid secondhand smoke, irritating chemicals, and strong fumes. These will make bronchitis worse. If you are a smoker, quit smoking. Consider using nicotine gum or  skin patches to help control withdrawal symptoms. Quitting smoking will help your lungs heal faster.   Put a cool-mist humidifier in your bedroom at night to moisten the air. This may help loosen mucus. Change the water in the humidifier daily. You can also run the hot water in your shower and sit in the bathroom with the door closed for 5 10 minutes.   Follow up with your health care provider as directed.   Wash your hands frequently to avoid catching bronchitis again or spreading an infection to others.  SEEK MEDICAL CARE IF: Your symptoms do not improve after 1 week of treatment.  SEEK IMMEDIATE MEDICAL CARE IF:  Your fever increases.  You have chills.   You have chest pain.   You have worsening shortness of breath.   You have bloody sputum.  You faint.  You have lightheadedness.  You have a severe headache.   You vomit repeatedly. MAKE SURE YOU:   Understand these instructions.  Will watch your condition.  Will get help right away if you are not doing well or get worse. Document Released: 07/29/2005 Document Revised: 05/19/2013 Document Reviewed: 03/23/2013 [facility] Patient Information 2014 [place], [place].   Hypertension As your heart beats, it forces blood through your arteries. This force is your blood pressure. If the pressure is too high, it is called hypertension (HTN) or high blood pressure. HTN is dangerous because you may have it and not know it. High blood pressure may mean that your heart has to work harder to pump blood. Your arteries may be narrow or stiff. The extra work puts you at risk for heart  disease, stroke, and other problems.  Blood pressure consists of two numbers, a higher number over a lower, 110/72, for example. It is stated as "110 over 72." The ideal is below 120 for the top number (systolic) and under 80 for the bottom (diastolic). Write down your blood pressure today. You should pay close attention to your blood pressure if you  have certain conditions such as:  Heart failure.  Prior heart attack.  Diabetes  Chronic kidney disease.  Prior stroke.  Multiple risk factors for heart disease. To see if you have HTN, your blood pressure should be measured while you are seated with your arm held at the level of the heart. It should be measured at least twice. A one-time elevated blood pressure reading (especially in the Emergency Department) does not mean that you need treatment. There may be conditions in which the blood pressure is different between your right and left arms. It is important to see your caregiver soon for a recheck. Most people have essential hypertension which means that there is not a specific cause. This type of high blood pressure may be lowered by changing lifestyle factors such as:  Stress.  Smoking.  Lack of exercise.  Excessive weight.  Drug/tobacco/alcohol use.  Eating less salt. Most people do not have symptoms from high blood pressure until it has caused damage to the body. Effective treatment can often prevent, delay or reduce that damage. TREATMENT  When a cause has been identified, treatment for high blood pressure is directed at the cause. There are a large number of medications to treat HTN. These fall into several categories, and your caregiver will help you select the medicines that are best for you. Medications may have side effects. You should review side effects with your caregiver. If your blood pressure stays high after you have made lifestyle changes or started on medicines,   Your medication(s) may need to be changed.  Other problems may need to be addressed.  Be certain you understand your prescriptions, and know how and when to take your medicine.  Be sure to follow up with your caregiver within the time frame advised (usually within two weeks) to have your blood pressure rechecked and to review your medications.  If you are taking more than one medicine to lower  your blood pressure, make sure you know how and at what times they should be taken. Taking two medicines at the same time can result in blood pressure that is too low. SEEK IMMEDIATE MEDICAL CARE IF:  You develop a severe headache, blurred or changing vision, or confusion.  You have unusual weakness or numbness, or a faint feeling.  You have severe chest or abdominal pain, vomiting, or breathing problems. MAKE SURE YOU:   Understand these instructions.  Will watch your condition.  Will get help right away if you are not doing well or get worse.   Diabetes and Exercise Exercising regularly is important. It is not just about losing weight. It has many health benefits, such as:  Improving your overall fitness, flexibility, and endurance.  Increasing your bone density.  Helping with weight control.  Decreasing your body fat.  Increasing your muscle strength.  Reducing stress and tension.  Improving your overall health. People with diabetes who exercise gain additional benefits because exercise:  Reduces appetite.  Improves the body's use of blood sugar (glucose).  Helps lower or control blood glucose.  Decreases blood pressure.  Helps control blood lipids (such as cholesterol and triglycerides).  Improves the body's use of the hormone insulin by:  Increasing the body's insulin sensitivity.  Reducing the body's insulin needs.  Decreases the risk for heart disease because exercising:  Lowers cholesterol and triglycerides levels.  Increases the levels of good cholesterol (such as high-density lipoproteins [HDL]) in the body.  Lowers blood glucose levels. YOUR ACTIVITY PLAN  Choose an activity that you enjoy and set realistic goals. Your health care provider or diabetes educator can help you make an activity plan that works for you. You can break activities into 2 or 3 sessions throughout the day. Doing so is as good as one long session. Exercise ideas  include:  Taking the dog for a walk.  Taking the stairs instead of the elevator.  Dancing to your favorite song.  Doing your favorite exercise with a friend. RECOMMENDATIONS FOR EXERCISING WITH TYPE 1 OR TYPE 2 DIABETES   Check your blood glucose before exercising. If blood glucose levels are greater than 240 mg/dL, check for urine ketones. Do not exercise if ketones are present.  Avoid injecting insulin into areas of the body that are going to be exercised. For example, avoid injecting insulin into:  The arms when playing tennis.  The legs when jogging.  Keep a record of:  Food intake before and after you exercise.  Expected peak times of insulin action.  Blood glucose levels before and after you exercise.  The type and amount of exercise you have done.  Review your records with your health care provider. Your health care provider will help you to develop guidelines for adjusting food intake and insulin amounts before and after exercising.  If you take insulin or oral hypoglycemic agents, watch for signs and symptoms of hypoglycemia. They include:  Dizziness.  Shaking.  Sweating.  Chills.  Confusion.  Drink plenty of water while you exercise to prevent dehydration or heat stroke. Body water is lost during exercise and must be replaced.  Talk to your health care provider before starting an exercise program to make sure it is safe for you. Remember, almost any type of activity is better than none.    Cholesterol Cholesterol is a white, waxy, fat-like protein needed by your body in small amounts. The liver makes all the cholesterol you need. It is carried from the liver by the blood through the blood vessels. Deposits (plaque) may build up on blood vessel walls. This makes the arteries narrower and stiffer. Plaque increases the risk for heart attack and stroke. You cannot feel your cholesterol level even if it is very high. The only way to know is by a blood test to  check your lipid (fats) levels. Once you know your cholesterol levels, you should keep a record of the test results. Work with your caregiver to to keep your levels in the desired range. WHAT THE RESULTS MEAN:  Total cholesterol is a rough measure of all the cholesterol in your blood.  LDL is the so-called bad cholesterol. This is the type that deposits cholesterol in the walls of the arteries. You want this level to be low.  HDL is the good cholesterol because it cleans the arteries and carries the LDL away. You want this level to be high.  Triglycerides are fat that the body can either burn for energy or store. High levels are closely linked to heart disease. DESIRED LEVELS:  Total cholesterol below 200.  LDL below 100 for people at risk, below 70 for very high risk.  HDL above 50 is  good, above 60 is best.  Triglycerides below 150. HOW TO LOWER YOUR CHOLESTEROL:  Diet.  Choose fish or white meat chicken and [place], roasted or baked. Limit fatty cuts of red meat, fried foods, and processed meats, such as sausage and lunch meat.  Eat lots of fresh fruits and vegetables. Choose whole grains, beans, pasta, potatoes and cereals.  Use only small amounts of olive, corn or canola oils. Avoid butter, mayonnaise, shortening or palm kernel oils. Avoid foods with trans-fats.  Use skim/nonfat milk and low-fat/nonfat yogurt and cheeses. Avoid whole milk, cream, ice cream, egg yolks and cheeses. Healthy desserts include angel food cake, ginger snaps, animal crackers, hard candy, popsicles, and low-fat/nonfat frozen yogurt. Avoid pastries, cakes, pies and cookies.  Exercise.  A regular program helps decrease LDL and raises HDL.  Helps with weight control.  Do things that increase your activity level like gardening, walking, or taking the stairs.  Medication.  May be prescribed by your caregiver to help lowering cholesterol and the risk for heart disease.  You may need medicine even if  your levels are normal if you have several risk factors. HOME CARE INSTRUCTIONS   Follow your diet and exercise programs as suggested by your caregiver.  Take medications as directed.  Have blood work done when your caregiver feels it is necessary. MAKE SURE YOU:   Understand these instructions.  Will watch your condition.  Will get help right away if you are not doing well or get worse.      Vitamin D Deficiency Vitamin D is an important vitamin that your body needs. Having too little of it in your body is called a deficiency. A very bad deficiency can make your bones soft and can cause a condition called rickets.  Vitamin D is important to your body for different reasons, such as:   It helps your body absorb 2 minerals called calcium and phosphorus.  It helps make your bones healthy.  It may prevent some diseases, such as diabetes and multiple sclerosis.  It helps your muscles and heart. You can get vitamin D in several ways. It is a natural part of some foods. The vitamin is also added to some dairy products and cereals. Some people take vitamin D supplements. Also, your body makes vitamin D when you are in the sun. It changes the sun's rays into a form of the vitamin that your body can use. CAUSES   Not eating enough foods that contain vitamin D.  Not getting enough sunlight.  Having certain digestive system diseases that make it hard to absorb vitamin D. These diseases include Crohn's disease, chronic pancreatitis, and cystic fibrosis.  Having a surgery in which part of the stomach or small intestine is removed.  Being obese. Fat cells pull vitamin D out of your blood. That means that obese people may not have enough vitamin D left in their blood and in other body tissues.  Having chronic kidney or liver disease. RISK FACTORS Risk factors are things that make you more likely to develop a vitamin D deficiency. They include:  Being older.  Not being able to get  outside very much.  Living in a nursing home.  Having had broken bones.  Having weak or thin bones (osteoporosis).  Having a disease or condition that changes how your body absorbs vitamin D.  Having dark skin.  Some medicines such as seizure medicines or steroids.  Being overweight or obese. SYMPTOMS Mild cases of vitamin D deficiency may not  have any symptoms. If you have a very bad case, symptoms may include:  Bone pain.  Muscle pain.  Falling often.  Broken bones caused by a minor injury, due to osteoporosis. DIAGNOSIS A blood test is the best way to tell if you have a vitamin D deficiency. TREATMENT Vitamin D deficiency can be treated in different ways. Treatment for vitamin D deficiency depends on what is causing it. Options include:  Taking vitamin D supplements.  Taking a calcium supplement. Your caregiver will suggest what dose is best for you. HOME CARE INSTRUCTIONS  Take any supplements that your caregiver prescribes. Follow the directions carefully. Take only the suggested amount.  Have your blood tested 2 months after you start taking supplements.  Eat foods that contain vitamin D. Healthy choices include:  Fortified dairy products, cereals, or juices. Fortified means vitamin D has been added to the food. Check the label on the package to be sure.  Fatty fish like salmon or trout.  Eggs.  Oysters.  Do not use a tanning bed.  Keep your weight at a healthy level. Lose weight if you need to.  Keep all follow-up appointments. Your caregiver will need to perform blood tests to make sure your vitamin D deficiency is going away. SEEK MEDICAL CARE IF:  You have any questions about your treatment.  You continue to have symptoms of vitamin D deficiency.  You have nausea or vomiting.  You are constipated.  You feel confused.  You have severe abdominal or back pain. MAKE SURE YOU:  Understand these instructions.  Will watch your  condition.  Will get help right away if you are not doing well or get worse.

## 2013-09-07 NOTE — Telephone Encounter (Signed)
09/07/13 PT SCHEDULED

## 2013-09-07 NOTE — Progress Notes (Signed)
Patient ID: Alexis Marquez, female   DOB: 08/22/1970, 43 y.o.   MRN: [id number]   This very nice 43 y.o. SBF presents for 2 months late 3 month follow up with Hypertension, Hyperlipidemia, T2 Diabetes, Morbid Obesity(BMI = 41)  and Vitamin D Deficiency. Also she presents c/o head and chest congestion.   HTN predates since 2000. BP has been controlled at home. Today's BP: 140/86 mmHg . Patient denies any cardiac type chest pain, palpitations, dyspnea/orthopnea/PND, dizziness, claudication, or dependent edema.   Hyperlipidemia is controlled with diet & meds. Last Cholesterol was  , Triglycerides were    , HDL    and LDL    . Patient denies myalgias or other med SE's.    Also, the patient has history of PreDiabetes/insulin resistance since 2008 and Metformin was started in June 2014 because of obesity and A1c of 5,7% and  elevated insulin(41) / insulin resistance in anticipation of facilitating weight loss. Unfortunately , she never remembered to refill her medication. Patient denies any symptoms of reactive hypoglycemia, diabetic polys, paresthesias or visual blurring.   Further, Patient has history of Vitamin D Deficiency with last vitamin D of 46 in Jukly 2014 (was 20 in 2008). Patient supplements vitamin D without any suspected side-effects.     Medication List       This list is accurate as of: 09/07/13  7:43 PM.  Always use your most recent med list.               azithromycin 250 MG tablet  Commonly known as:  ZITHROMAX  Take 2 tablets (500 mg) on  Day 1,  followed by 1 tablet (250 mg) once daily on Days 2 through 5.     benazepril-hydrochlorthiazide 20-25 MG per tablet  Commonly known as:  LOTENSIN HCT  TAKE ONE TABLET BY MOUTH ONCE DAILY     ergocalciferol 50000 UNITS capsule  Commonly known as:  VITAMIN D2  Take 50,000 Units by mouth. Monday, Wednesday and Friday     esomeprazole 40 MG capsule  Commonly known as:  NEXIUM  Take 40 mg by mouth as needed.     HYDROcodone-acetaminophen 5-325 MG per tablet  Commonly known as:  NORCO  1/2 to 1 tablet every 3 to 4 hours as need for cough or pain     levalbuterol 45 MCG/ACT inhaler  Commonly known as:  XOPENEX HFA  Inhale 2 puffs into the lungs every 4 (four) hours as needed for wheezing. For shortness of breath     metFORMIN 500 MG 24 hr tablet  Commonly known as:  GLUCOPHAGE XR  1 to 2 tablets twice daily with food for Diabetes     pravastatin 40 MG tablet  Commonly known as:  PRAVACHOL  Take 40 mg by mouth daily.     predniSONE 10 MG tablet  Commonly known as:  DELTASONE  Take 1 tablet (10 mg total) by mouth See admin instructions. 1 tab 3 x day for 3 days, then 1 tab 2 x day for 3 days, then 1 tab 1 x day for 5 days         No Known Allergies  PMHx:   Past Medical History  Diagnosis Date  . Hypertension   . High cholesterol   . Asthma   . DM (diabetes mellitus)     borderline  . Low blood potassium   . Iron deficiency anemia   . Allergic rhinitis     FHx:    Reviewed /  unchanged  SHx:    Reviewed / unchanged  Systems Review: Constitutional: Denies fever, chills, wt changes, headaches, insomnia, fatigue, night sweats, change in appetite. Eyes: Denies redness, blurred vision, diplopia, discharge, itchy, watery eyes.  ENT: Denies discharge, , post nasal drip, epistaxis, , earache, hearing loss, dental pain, tinnitus, vertigo, , snoring. She has congestion, Lt Max. sinus pain & sore throat.  CV: Denies chest pain, palpitations, irregular heartbeat, syncope, dyspnea, diaphoresis, orthopnea, PND, claudication, edema. Respiratory: Positive  cough, hoarseness and laryngitis, but no dyspnea, DOE, pleurisy,  wheezing.  Gastrointestinal: Denies dysphagia, odynophagia, heartburn, reflux, water brash, abdominal pain or cramps, nausea, vomiting, bloating, diarrhea, constipation, hematemesis, melena, hematochezia,  or hemorrhoids. Genitourinary: Denies dysuria, frequency, urgency,  nocturia, hesitancy, discharge, hematuria, flank pain. Musculoskeletal: Denies arthralgias, myalgias, stiffness, jt. swelling, pain, limp, strain/sprain.  Skin: Denies pruritus, rash, hives, warts, acne, eczema, change in skin lesion(s). Neuro: No weakness, tremor, incoordination, spasms, paresthesia, or pain. Psychiatric: Denies confusion, memory loss, or sensory loss. Endo: Denies change in weight, skin, hair change.  Heme/Lymph: No excessive bleeding, bruising, orenlarged lymph nodes.  BP: 140/86  Pulse: 60  Temp: 97.9 F (36.6 C)  Resp: 16    Estimated body mass index is 40.78 kg/(m^2) as calculated from the following:   Height as of 06/15/13: 4\' 11"  (1.499 m).   Weight as of this encounter: 202 lb (91.627 kg).  On Exam: Appears well nourished - in no distress. Eyes: PERRLA, EOMs, conjunctiva no swelling or erythema. Sinuses: No frontal/ but has Lt maxillary tenderness ENT/Mouth: EAC's clear, TM's nl w/o erythema, bulging. Nares clear w/o erythema, swelling, exudates. Oropharynx clear without erythema or exudates. Oral hygiene is good. Tongue normal, non obstructing. Hearing intact.  Neck: Supple. Thyroid nl. Car 2+/2+ without bruits, nodes or JVD. Chest: Respirations nl with BS decreased due to obesity w/ rales, but no  rhonchi, wheezing or stridor.  Cor: Heart sounds normal w/ regular rate and rhythm without sig. murmurs, gallops, clicks, or rubs. Peripheral pulses normal and equal  without edema.  Abdomen: Soft & bowel sounds normal. Non-tender w/o guarding, rebound, hernias, masses, or organomegaly.  Lymphatics: Unremarkable.  Musculoskeletal: Full ROM all peripheral extremities, joint stability, 5/5 strength, and normal gait.  Skin: Warm, dry without exposed rashes, lesions, ecchymosis apparent.  Neuro: Cranial nerves intact, reflexes equal bilaterally. Sensory-motor testing grossly intact. Tendon reflexes grossly intact.  Pysch: Alert & oriented x 3. Insight and judgement nl  & appropriate. No ideations.  Assessment and Plan:  1. Hypertension - Continue monitor blood pressure at home. Continue diet/meds same.  2. Hyperlipidemia - Continue diet/meds, exercise,& lifestyle modifications. Continue monitor periodic cholesterol/liver & renal functions   3. Pre-diabetes/Insulin Resistance - Continue diet, exercise, lifestyle modifications. Monitor appropriate labs.  4. Vitamin D Deficiency - Continue supplementation.  5. Sinusitis  6. Tracheitis  Recommended regular exercise, BP monitoring, weight control, and discussed med and SE's. Recommended labs to assess and monitor clinical status. Further disposition pending results of labs.   Rx Z pak, prednisone taper & Norco for cough.

## 2013-09-08 LAB — HEPATIC FUNCTION PANEL
ALK PHOS: 45 U/L (ref 39–117)
ALT: 29 U/L (ref 0–35)
AST: 21 U/L (ref 0–37)
Albumin: 4 g/dL (ref 3.5–5.2)
BILIRUBIN DIRECT: 0.1 mg/dL (ref 0.0–0.3)
BILIRUBIN INDIRECT: 0.3 mg/dL (ref 0.0–0.9)
TOTAL PROTEIN: 7.5 g/dL (ref 6.0–8.3)
Total Bilirubin: 0.4 mg/dL (ref 0.3–1.2)

## 2013-09-08 LAB — HEMOGLOBIN A1C
HEMOGLOBIN A1C: 5.9 % — AB (ref <5.7)
Mean Plasma Glucose: 123 mg/dL — ABNORMAL HIGH (ref <117)

## 2013-09-08 LAB — LIPID PANEL
Cholesterol: 159 mg/dL (ref 0–200)
HDL: 41 mg/dL (ref >39)
LDL CALC: 101 mg/dL — AB (ref 0–99)
Total CHOL/HDL Ratio: 3.9 Ratio
Triglycerides: 83 mg/dL (ref <150)
VLDL: 17 mg/dL (ref 0–40)

## 2013-09-08 LAB — INSULIN, FASTING: Insulin fasting, serum: 38 u[IU]/mL — ABNORMAL HIGH (ref 3–28)

## 2013-09-08 LAB — BASIC METABOLIC PANEL WITH GFR
BUN: 7 mg/dL (ref 6–23)
CHLORIDE: 102 meq/L (ref 96–112)
CO2: 27 mEq/L (ref 19–32)
Calcium: 9.2 mg/dL (ref 8.4–10.5)
Creat: 0.6 mg/dL (ref 0.50–1.10)
GFR, Est African American: >89 mL/min
GLUCOSE: 96 mg/dL (ref 70–99)
POTASSIUM: 3.7 meq/L (ref 3.5–5.3)
SODIUM: 138 meq/L (ref 135–145)

## 2013-09-08 LAB — TSH: TSH: 0.53 u[IU]/mL (ref 0.350–4.500)

## 2013-09-08 LAB — VITAMIN D 25 HYDROXY (VIT D DEFICIENCY, FRACTURES): Vit D, 25-Hydroxy: 32 ng/mL (ref 30–89)

## 2013-09-08 LAB — MAGNESIUM: MAGNESIUM: 1.9 mg/dL (ref 1.5–2.5)

## 2013-09-17 ENCOUNTER — Encounter: Payer: Self-pay | Admitting: Emergency Medicine

## 2013-09-17 ENCOUNTER — Ambulatory Visit (INDEPENDENT_AMBULATORY_CARE_PROVIDER_SITE_OTHER): Payer: 59 | Admitting: Emergency Medicine

## 2013-09-17 VITALS — BP 124/62 | HR 74 | Temp 98.0°F | Resp 18 | Ht 60.0 in | Wt 202.0 lb

## 2013-09-17 DIAGNOSIS — D649 Anemia, unspecified: Secondary | ICD-10-CM

## 2013-09-17 DIAGNOSIS — R5381 Other malaise: Secondary | ICD-10-CM

## 2013-09-17 DIAGNOSIS — R5383 Other fatigue: Principal | ICD-10-CM

## 2013-09-17 LAB — IRON AND TIBC
%SAT: 8 % — ABNORMAL LOW (ref 20–55)
Iron: 35 ug/dL — ABNORMAL LOW (ref 42–145)
TIBC: 451 ug/dL (ref 250–470)
UIBC: 416 ug/dL — ABNORMAL HIGH (ref 125–400)

## 2013-09-17 LAB — VITAMIN B12: VITAMIN B 12: 609 pg/mL (ref 211–911)

## 2013-09-17 LAB — RETICULOCYTES
ABS Retic: 137 10*3/uL (ref 19.0–186.0)
RBC.: 4.28 MIL/uL (ref 3.87–5.11)
RETIC CT PCT: 3.2 % — AB (ref 0.4–2.3)

## 2013-09-17 LAB — CBC WITH DIFFERENTIAL/PLATELET
BASOS PCT: 0 % (ref 0–1)
Basophils Absolute: 0 10*3/uL (ref 0.0–0.1)
EOS ABS: 0.1 10*3/uL (ref 0.0–0.7)
EOS PCT: 2 % (ref 0–5)
HEMATOCRIT: 29.9 % — AB (ref 36.0–46.0)
HEMOGLOBIN: 9.4 g/dL — AB (ref 12.0–15.0)
Lymphocytes Relative: 41 % (ref 12–46)
Lymphs Abs: 3.8 10*3/uL (ref 0.7–4.0)
MCH: 22 pg — ABNORMAL LOW (ref 26.0–34.0)
MCHC: 31.4 g/dL (ref 30.0–36.0)
MCV: 69.9 fL — ABNORMAL LOW (ref 78.0–100.0)
MONO ABS: 0.9 10*3/uL (ref 0.1–1.0)
MONOS PCT: 9 % (ref 3–12)
Neutro Abs: 4.5 10*3/uL (ref 1.7–7.7)
Neutrophils Relative %: 48 % (ref 43–77)
Platelets: 405 10*3/uL — ABNORMAL HIGH (ref 150–400)
RBC: 4.28 MIL/uL (ref 3.87–5.11)
RDW: 18.5 % — AB (ref 11.5–15.5)
WBC: 9.3 10*3/uL (ref 4.0–10.5)

## 2013-09-17 LAB — FERRITIN: Ferritin: 2 ng/mL — ABNORMAL LOW (ref 10–291)

## 2013-09-17 LAB — SEDIMENTATION RATE: Sed Rate: 12 mm/hr (ref 0–22)

## 2013-09-17 NOTE — Patient Instructions (Signed)
Dehydration, Adult Dehydration means your body does not have as much fluid as it needs. Your kidneys, brain, and heart will not work properly without the right amount of fluids and salt.  HOME CARE  Ask your doctor how to replace body fluid losses (rehydrate).  Drink enough fluids to keep your pee (urine) clear or pale yellow.  Drink small amounts of fluids often if you feel sick to your stomach (nauseous) or throw up (vomit).  Eat like you normally do.  Avoid:  Foods or drinks high in sugar.  Bubbly (carbonated) drinks.  Juice.  Very hot or cold fluids.  Drinks with caffeine.  Fatty, greasy foods.  Alcohol.  Tobacco.  Eating too much.  Gelatin desserts.  Wash your hands to avoid spreading germs (bacteria, viruses).  Only take medicine as told by your doctor.  Keep all doctor visits as told. GET HELP RIGHT AWAY IF:   You cannot drink something without throwing up.  You get worse even with treatment.  Your vomit has blood in it or looks greenish.  Your poop (stool) has blood in it or looks black and tarry.  You have not peed in 6 to 8 hours.  You pee a small amount of very dark pee.  You have a fever.  You pass out (faint).  You have belly (abdominal) pain that gets worse or stays in one spot (localizes).  You have a rash, stiff neck, or bad headache.  You get easily annoyed, sleepy, or are hard to wake up.  You feel weak, dizzy, or very thirsty. MAKE SURE YOU:   Understand these instructions.  Will watch your condition.  Will get help right away if you are not doing well or get worse. Document Released: 05/25/2009 Document Revised: 10/21/2011 Document Reviewed: 03/18/2011 [facility] Patient Information 2014 [place], [place]. Anemia, Nonspecific Anemia is a condition in which the concentration of red blood cells or hemoglobin in the blood is below normal. Hemoglobin is a substance in red blood cells that carries oxygen to the tissues of the body.  Anemia results in not enough oxygen reaching these tissues.  CAUSES  Common causes of anemia include:   Excessive bleeding. Bleeding may be internal or external. This includes excessive bleeding from periods (in women) or from the intestine.   Poor nutrition.   Chronic kidney, thyroid, and liver disease.  Bone marrow disorders that decrease red blood cell production.  Cancer and treatments for cancer.  HIV, AIDS, and their treatments.  Spleen problems that increase red blood cell destruction.  Blood disorders.  Excess destruction of red blood cells due to infection, medicines, and autoimmune disorders. SIGNS AND SYMPTOMS   Minor weakness.   Dizziness.   Headache.  Palpitations.   Shortness of breath, especially with exercise.   Paleness.  Cold sensitivity.  Indigestion.  Nausea.  Difficulty sleeping.  Difficulty concentrating. Symptoms may occur suddenly or they may develop slowly.  DIAGNOSIS  Additional blood tests are often needed. These help your health care provider determine the best treatment. Your health care provider will check your stool for blood and look for other causes of blood loss.  TREATMENT  Treatment varies depending on the cause of the anemia. Treatment can include:   Supplements of iron, vitamin B12, or folic acid.   Hormone medicines.   A blood transfusion. This may be needed if blood loss is severe.   Hospitalization. This may be needed if there is significant continual blood loss.   Dietary changes.  Spleen removal.  HOME CARE INSTRUCTIONS Keep all follow-up appointments. It often takes many weeks to correct anemia, and having your health care provider check on your condition and your response to treatment is very important. SEEK IMMEDIATE MEDICAL CARE IF:   You develop extreme weakness, shortness of breath, or chest pain.   You become dizzy or have trouble concentrating.  You develop heavy vaginal bleeding.    You develop a rash.   You have bloody or black, tarry stools.   You faint.   You vomit up blood.   You vomit repeatedly.   You have abdominal pain.  You have a fever or persistent symptoms for more than 2 3 days.   You have a fever and your symptoms suddenly get worse.   You are dehydrated.  MAKE SURE YOU:  Understand these instructions.  Will watch your condition.  Will get help right away if you are not doing well or get worse. Document Released: 09/05/2004 Document Revised: 03/31/2013 Document Reviewed: 01/22/2013 [facility] Patient Information 2014 [place], [place].

## 2013-09-17 NOTE — Progress Notes (Signed)
Subjective:    Patient ID: Alexis Marquez, female    DOB: 09-Sep-1970, 43 y.o.   MRN: [id number]  HPI Comments: 43 yo AAF f/u for anemia and fatigue. Last cycle 08/30/13 medium. She has had GYN evaluation which was negative. She had negative GI and cardiac evaluation. She has not seen hematologist. HGB was 9.3 7/ 2014. HGB 9.5 02/2013. HGb 10.5 4/ 2014 She is staying fatigued.  Last CBC  WBC      6.3   09/07/2013 HGB      9.0   09/07/2013 HCT     28.6   09/07/2013 MCV     69.2   09/07/2013 PLT      429   09/07/2013  She denies any obvious blood loss or trauma/ SOB or pain.   Current Outpatient Prescriptions on File Prior to Visit  Medication Sig Dispense Refill  . benazepril-hydrochlorthiazide (LOTENSIN HCT) 20-25 MG per tablet TAKE ONE TABLET BY MOUTH ONCE DAILY  30 tablet  0  . ergocalciferol (VITAMIN D2) 50000 UNITS capsule Take 50,000 Units by mouth. Monday, Wednesday and Friday      . esomeprazole (NEXIUM) 40 MG capsule Take 40 mg by mouth as needed.       Marland Kitchen. HYDROcodone-acetaminophen (NORCO) 5-325 MG per tablet 1/2 to 1 tablet every 3 to 4 hours as need for cough or pain  50 tablet  0  . levalbuterol (XOPENEX HFA) 45 MCG/ACT inhaler Inhale 2 puffs into the lungs every 4 (four) hours as needed for wheezing. For shortness of breath  1 Inhaler  99  . metFORMIN (GLUCOPHAGE XR) 500 MG 24 hr tablet 1 to 2 tablets twice daily with food for Diabetes  120 tablet  11  . pravastatin (PRAVACHOL) 40 MG tablet Take 40 mg by mouth daily.       No current facility-administered medications on file prior to visit.   No Known Allergies Past Medical History  Diagnosis Date  . Hypertension   . High cholesterol   . Asthma   . DM (diabetes mellitus)     borderline  . Low blood potassium   . Iron deficiency anemia   . Allergic rhinitis       Review of Systems  Constitutional: Positive for fatigue.  All other systems reviewed and are negative.   BP 124/62  Pulse 74  Temp(Src) 98 F (36.7  C) (Temporal)  Resp 18  Ht 5' (1.524 m)  Wt 202 lb (91.627 kg)  BMI 39.45 kg/m2  LMP 08/30/2013     Objective:   Physical Exam  Nursing note and vitals reviewed. Constitutional: She is oriented to person, place, and time. She appears well-developed and well-nourished. No distress.  HENT:  Head: Normocephalic and atraumatic.  Right Ear: External ear normal.  Left Ear: External ear normal.  Nose: Nose normal.  Mouth/Throat: Oropharynx is clear and moist.  Eyes: Conjunctivae and EOM are normal.  Neck: Normal range of motion. Neck supple. No JVD present. No thyromegaly present.  Cardiovascular: Normal rate, regular rhythm, normal heart sounds and intact distal pulses.   Pulmonary/Chest: Effort normal and breath sounds normal.  Abdominal: Soft. Bowel sounds are normal. She exhibits no distension and no mass. There is no tenderness. There is no rebound and no guarding.  Musculoskeletal: Normal range of motion. She exhibits no edema and no tenderness.  Lymphadenopathy:    She has no cervical adenopathy.  Neurological: She is alert and oriented to person, place, and time. No cranial nerve  deficit.  Skin: Skin is warm and dry. No rash noted. No erythema. No pallor.  Psychiatric: She has a normal mood and affect. Her behavior is normal. Judgment and thought content normal.          Assessment & Plan:  Anemia HX with NEG GI an dGYN eval with Fatigue- check labs, increase activity and H2O. If still low refer to Hematology

## 2013-09-19 LAB — FOLATE RBC: RBC Folate: 843 ng/mL (ref >280)

## 2013-09-20 ENCOUNTER — Other Ambulatory Visit: Payer: Self-pay | Admitting: Emergency Medicine

## 2013-09-20 DIAGNOSIS — D649 Anemia, unspecified: Secondary | ICD-10-CM

## 2013-09-20 LAB — ANTI-DNA ANTIBODY, DOUBLE-STRANDED

## 2013-09-21 ENCOUNTER — Telehealth: Payer: Self-pay | Admitting: Oncology

## 2013-09-21 NOTE — Telephone Encounter (Signed)
LEFT MESSAGE FOR PATIENT TO RETURN CALL TO SCHEDULE NEW PATIENT APPT.  °

## 2013-09-22 ENCOUNTER — Telehealth: Payer: Self-pay | Admitting: Oncology

## 2013-09-22 NOTE — Telephone Encounter (Signed)
C/D 09/22/13 for appt. 09/27/13 °

## 2013-09-22 NOTE — Telephone Encounter (Signed)
NEW PATIENT SCHEDULED FOR 02/16 @ 10:30 W/DR. LIVESAY.  REFERRING DR. Chrissie NoaWILLIAM MCKEOWN DX- ABN CBC, FERRITIN, IRON

## 2013-09-22 NOTE — Telephone Encounter (Signed)
LEFT MESSAGE FOR PATIENT TO RETURN CALL TO SCHEDULE NEW PATIENT APPT.  °

## 2013-09-23 ENCOUNTER — Other Ambulatory Visit: Payer: Self-pay | Admitting: Oncology

## 2013-09-23 DIAGNOSIS — D509 Iron deficiency anemia, unspecified: Secondary | ICD-10-CM

## 2013-09-27 ENCOUNTER — Ambulatory Visit (HOSPITAL_BASED_OUTPATIENT_CLINIC_OR_DEPARTMENT_OTHER): Payer: 59 | Admitting: Oncology

## 2013-09-27 ENCOUNTER — Encounter: Payer: Self-pay | Admitting: Oncology

## 2013-09-27 ENCOUNTER — Other Ambulatory Visit (HOSPITAL_BASED_OUTPATIENT_CLINIC_OR_DEPARTMENT_OTHER): Payer: 59

## 2013-09-27 ENCOUNTER — Telehealth: Payer: Self-pay | Admitting: Oncology

## 2013-09-27 ENCOUNTER — Ambulatory Visit: Payer: 59

## 2013-09-27 VITALS — BP 135/43 | HR 74 | Temp 98.4°F | Resp 20 | Ht 60.0 in | Wt 200.2 lb

## 2013-09-27 DIAGNOSIS — D500 Iron deficiency anemia secondary to blood loss (chronic): Secondary | ICD-10-CM

## 2013-09-27 DIAGNOSIS — D509 Iron deficiency anemia, unspecified: Secondary | ICD-10-CM

## 2013-09-27 DIAGNOSIS — N920 Excessive and frequent menstruation with regular cycle: Secondary | ICD-10-CM

## 2013-09-27 LAB — CBC WITH DIFFERENTIAL/PLATELET
BASO%: 0.5 % (ref 0.0–2.0)
Basophils Absolute: 0 10*3/uL (ref 0.0–0.1)
EOS%: 2 % (ref 0.0–7.0)
Eosinophils Absolute: 0.1 10*3/uL (ref 0.0–0.5)
HCT: 31.3 % — ABNORMAL LOW (ref 34.8–46.6)
HGB: 9.6 g/dL — ABNORMAL LOW (ref 11.6–15.9)
LYMPH#: 2.5 10*3/uL (ref 0.9–3.3)
LYMPH%: 42.2 % (ref 14.0–49.7)
MCH: 21.9 pg — ABNORMAL LOW (ref 25.1–34.0)
MCHC: 30.7 g/dL — AB (ref 31.5–36.0)
MCV: 71.3 fL — ABNORMAL LOW (ref 79.5–101.0)
MONO#: 0.5 10*3/uL (ref 0.1–0.9)
MONO%: 9.2 % (ref 0.0–14.0)
NEUT#: 2.7 10*3/uL (ref 1.5–6.5)
NEUT%: 46.1 % (ref 38.4–76.8)
NRBC: 0 % (ref 0–0)
Platelets: 325 10*3/uL (ref 145–400)
RBC: 4.39 10*6/uL (ref 3.70–5.45)
RDW: 19.1 % — ABNORMAL HIGH (ref 11.2–14.5)
WBC: 5.9 10*3/uL (ref 3.9–10.3)

## 2013-09-27 LAB — MORPHOLOGY: PLT EST: ADEQUATE

## 2013-09-27 MED ORDER — HEMOCYTE 324 (106 FE) MG PO TABS: ORAL_TABLET | ORAL | Status: DC

## 2013-09-27 NOTE — Progress Notes (Signed)
MEDICAL ONCOLOGY NEW PATIENT CONSULTATION   09/27/2013   Referring physician: [facility] Cowboy Physicians:  Melvia Heaps, C.McAlhany, Shea Evans   Reason for referral: iron deficiency anemia  Patient is seen, alone for visit, on referral from PCP Dr Oneta Rack for persistent iron deficiency anemia. History is from records received from Dr Kathryne Sharper office, EMR and patient, who is a good historian.  Patient recalls that she has been anemic "for years", but feels that the problem has worsened recently. She has had heavier menstrual bleeding for at least 2 years, soaking a pad an hour for first 3 days of each 5 day cycle, cycles regular at 28 day intervals. She is not aware of other bleeding, has not done hemoccult cards, has not had GI endoscopy, has never donated blood and is not vegetarian/ vegan. She craves ice, also for ~ past 2 years, no other pica symptoms. She does not feel more fatigued than usual, does not notice SOB, does not have chest pain. She last used iron for ~ 6 months, stopped 6 months ago; she used a liquid iron preparation from [employer] then, which she tolerated well. Remotely she tried Slow iron preparation, which caused constipation. She is not on any iron now. She has not had IV iron.  She had consultation with Dr Melvia Heaps in [phone] for the anemia. She was seen ~ 05-2013 by Dr Juliene Pina for the menorrhagia, with US done. She needs to set up follow up with gyn, as she had work conflict with last visit. She had normal echocardiogram by cardiology 06-2013  Earliest prior CBC available to me is from 01-11-2012, with hemoglobin 9.7, MCV 76.7, plt 269 and WBC 7.2. Hemoglobin electrophoresis in 04-2013 was normal. On 09-07-2013 hemoglobin was 9.0 with MCV 69.2, plt 429 and WBC 6.3; BMET and hepatic panel were normal in 08-2013. On 09-17-2013 Hgb was 9.4, MCV 69.9, plt 405k and WBC 9.3; serum iron was 35, %sat 8 and ferritin 2 with retic 3.2%.    Review of systems as above, also: Pain  low back to RLE since last pm, identical to symptoms a year ago thought disc disease. Occasional HA which she refers to as migraines, resolve with OTC meds. No glasses, no hearing problems, no sinus symptoms, no known thyroid disease. No dental pain tho needs a tooth extracted. Long standing asthma with exacerbations every 2-3 months, uses prn inhaler. No changes on breast self exam. No palpitations. No change in bowels, no dark or tarry stools. No bladder symptoms. No history blood clots. No arthritis. No skin rash. Remainder of 10 point Review of Systems negative.    Allergies: no drug allergies known   Past Medical Surgical History:  C sections x 4, last 19 years ago HTN since 2000 DM on metformin since 01-2013 Morbid obesity VIt D 46 in 02-2013 Asthma intermittently x years Umbilical hernia repair age 41 Mammograms at [facility] 11[facility]2009, breast tissue extremely dense Elevated cholesterol   FAMILY HISTORY Mother with DM, CVA 15 years ago, now age 53 Maternal grandmother with heart disease Maternal aunt with breast cancer Children ages 14, 63, 65, 8 all healthy  SOCIAL HISTORY Originally from [place]. Works in Fish farm manager related job, knows work schedule only a week ahead. Never smoker. Never transfused. Occasional wine. 3 children in [place], 1 in [place].  MEDICATIONS: Reviewed as listed in EMR. She had flu vaccine this season.  PHARMACY: [address] (alternative [facility])  Objective:  Vital signs in last 24 hours:  BP  135/43  Pulse 74  Temp(Src) 98.4 F (36.9 C) (Oral)  Resp 20  Ht 5' (1.524 m)  Wt 200 lb 3.2 oz (90.81 kg)  BMI 39.10 kg/m2  LMP 08/30/2013  Alert, oriented and appropriate. Ambulatory without assistance, favoring RLE.Marland Kitchen    HEENT:PERRL, sclerae not icteric. Oral mucosa moist without lesions, posterior pharynx clear. Mucous membranes somewhat pale, tongue not markedly smooth. Normal hair pattern. Neck supple. No JVD. No  apparent thyroid mass Lymphatics:no cervical,suraclavicular, axillary or inguinal adenopathy Resp: clear to auscultation bilaterally and normal percussion bilaterally, no wheezing. Cardio: regular rate and rhythm. No gallop. GI: abdomen obese, soft, nontender, not distended, no appreciable mass or organomegaly. Normally active bowel sounds.  Musculoskeletal/ Extremities: without pitting edema, cords, tenderness. No point tenderness along spine. Neuro: CN, motor, sensory, cerebellar grossly nonfocal Skin without rash, ecchymosis, petechiae    Lab Results:  Results for orders placed in visit on 09/27/13  CBC WITH DIFFERENTIAL      Result Value Ref Range   WBC 5.9  3.9 - 10.3 10e3/uL   NEUT# 2.7  1.5 - 6.5 10e3/uL   HGB 9.6 (*) 11.6 - 15.9 g/dL   HCT 40.9 (*) 81.1 - 91.4 %   Platelets 325  145 - 400 10e3/uL   MCV 71.3 (*) 79.5 - 101.0 fL   MCH 21.9 (*) 25.1 - 34.0 pg   MCHC 30.7 (*) 31.5 - 36.0 g/dL   RBC 7.82  9.56 - 2.13 10e6/uL   RDW 19.1 (*) 11.2 - 14.5 %   lymph# 2.5  0.9 - 3.3 10e3/uL   MONO# 0.5  0.1 - 0.9 10e3/uL   Eosinophils Absolute 0.1  0.0 - 0.5 10e3/uL   Basophils Absolute 0.0  0.0 - 0.1 10e3/uL   NEUT% 46.1  38.4 - 76.8 %   LYMPH% 42.2  14.0 - 49.7 %   MONO% 9.2  0.0 - 14.0 %   EOS% 2.0  0.0 - 7.0 %   BASO% 0.5  0.0 - 2.0 %   nRBC 0  0 - 0 %  MORPHOLOGY      Result Value Ref Range   Polychromasia Slight  Slight   Ovalocytes Few  Negative   White Cell Comments Variant Lymphs     PLT EST Adequate  Adequate     Studies/Results:  No results found. Last CXR in EMR 01-2012 NAD   DISCUSSION: we have discussed all of history above, previous lab information and today's lab results. We have discussed what is likely chronic iron deficiency from gyn blood loss, which may well be more pronounced with recent heavier bleeding. We have discussed difficulty absorbing iron by mouth and strategies to improve this, specifically using ferrous fumarate or gluconate on empty  stomach with OJ or Vit C (no slow release preparations). If her insurance covers Hemocyte, that would be first choice. She understands that it can take months even in absence of further blood loss to replete iron stores, but that she may tell benefit even when this begins to improve. I mentioned use of IV iron in situations where patient is acutely symptomatic or unable to tolerate/ absorb po iron. She has had all questions answered and is in agreement with plan to proceed with oral ferrous fumarate. I have encouraged her to follow up with Dr Juliene Pina, as any improvement in the gyn bleeding would also be helpful from standpoint of anemia. She should have hemoccult cards tested also, but gyn bleeding may well be etiology here.  Assessment/Plan: 1.Iron  deficiency anemia related to chronic gyn blood loss: will begin oral iron as Hemocyte/ ferrous fumarate 1-2x daily on empty stomach with OJ. I will see her back with CBC in 2-3 months (Note work schedule variable and she does not know this very far ahead, so may need to adjust appointment). 2.heavy menstrual bleeding: Dr Juliene PinaMody involved 3. Obesity 4.HTN, elevated lipids 5.diabetes on oral agent 6.chronic asthma 7.low back/ RLE pain in past 24 hours, similar to problem reportedly with disc a year ago     No consent necessary. No chemotherapy planned/ no # cycles needed. No antiemetics needed.No cancer staging appropriate.  Kate Larock P, MD   09/27/2013, 11:57 AM

## 2013-09-27 NOTE — Patient Instructions (Addendum)
Will try iron as ferrous fumarate or ferrous gluconate, as these are generally easiest on GI tract -- We will try Hemocyte name brand if your insurance will cover that, otherwise we will let your pharmacist know which over the counter type we want you to have. Since iron is very hard to absorb, it is best to take on empty stomach (=at least 1 hour before or at least 2 hrs after you eat) with orange juice or Vitamin C tablet. Start once daily and increase to twice daily if you can.  Let Dr Precious ReelLivesay's RN know if you cannot tolerate the iron this way  [phone]

## 2013-09-27 NOTE — Telephone Encounter (Signed)
, °

## 2013-09-29 ENCOUNTER — Ambulatory Visit: Payer: 59

## 2013-09-29 ENCOUNTER — Other Ambulatory Visit: Payer: 59

## 2013-09-29 ENCOUNTER — Ambulatory Visit: Payer: 59 | Admitting: Oncology

## 2013-09-29 DIAGNOSIS — E559 Vitamin D deficiency, unspecified: Secondary | ICD-10-CM | POA: Insufficient documentation

## 2013-09-30 ENCOUNTER — Ambulatory Visit (INDEPENDENT_AMBULATORY_CARE_PROVIDER_SITE_OTHER): Payer: 59 | Admitting: Internal Medicine

## 2013-09-30 ENCOUNTER — Encounter: Payer: Self-pay | Admitting: Internal Medicine

## 2013-09-30 ENCOUNTER — Ambulatory Visit: Payer: Self-pay | Admitting: Internal Medicine

## 2013-09-30 VITALS — BP 120/82 | HR 60 | Temp 99.3°F | Resp 16 | Wt 199.6 lb

## 2013-09-30 DIAGNOSIS — D649 Anemia, unspecified: Secondary | ICD-10-CM

## 2013-09-30 DIAGNOSIS — E559 Vitamin D deficiency, unspecified: Secondary | ICD-10-CM

## 2013-09-30 DIAGNOSIS — Z79899 Other long term (current) drug therapy: Secondary | ICD-10-CM | POA: Insufficient documentation

## 2013-09-30 DIAGNOSIS — R509 Fever, unspecified: Secondary | ICD-10-CM

## 2013-09-30 DIAGNOSIS — J0410 Acute tracheitis without obstruction: Secondary | ICD-10-CM

## 2013-09-30 LAB — URINALYSIS, MICROSCOPIC ONLY
BACTERIA UA: NONE SEEN
Casts: NONE SEEN
Crystals: NONE SEEN

## 2013-09-30 NOTE — Progress Notes (Signed)
Subjective:    Patient ID: Alexis Marquez, female    DOB: 05/10/1971, 43 y.o.   MRN: [id number]  HPI Patients for F/U of tracheobronchitis wit resp Sx's resolved . In the interim was seen by Dr Darrold Span recc recheck at [facility]yn[facility] for heavy menometrorrhagia accounting for her severe iron deficiency.  Also Pt says GI never gave her a hemoccult card . Other problems noted today is low grade fever 97.3 with pt denying resp Sx's. She does report LBP and Lt sciatica x 2 days. ( states she had this about a year ago)   Medication List       benazepril-hydrochlorthiazide 20-25 MG per tablet  Commonly known as:  LOTENSIN HCT  TAKE ONE TABLET BY MOUTH ONCE DAILY     ergocalciferol 50000 UNITS capsule  Commonly known as:  VITAMIN D2  Take 50,000 Units by mouth. Monday, Wednesday and Friday     esomeprazole 40 MG capsule  Commonly known as:  NEXIUM  Take 40 mg by mouth as needed.     HEMOCYTE 325 (106 FE) MG Tabs tablet  Generic drug:  ferrous fumarate  Take 1 tablet twice a day on empty stomach with OJ or Vitamin C tab     HYDROcodone-acetaminophen 5-325 MG per tablet  Commonly known as:  NORCO  1/2 to 1 tablet every 3 to 4 hours as need for cough or pain     levalbuterol 45 MCG/ACT inhaler  Commonly known as:  XOPENEX HFA  Inhale 2 puffs into the lungs every 4 (four) hours as needed for wheezing. For shortness of breath     metFORMIN 500 MG 24 hr tablet  Commonly known as:  GLUCOPHAGE-XR  1,000 mg daily.     pravastatin 40 MG tablet  Commonly known as:  PRAVACHOL  Take 40 mg by mouth daily.      No Known Allergies  Past Medical History  Diagnosis Date  . Hypertension   . High cholesterol   . Asthma   . DM (diabetes mellitus)     borderline  . Low blood potassium   . Iron deficiency anemia   . Allergic rhinitis      Review of Systems  Constitutional: Negative.   HENT: Negative.   Eyes: Negative.   Respiratory: Negative.   Cardiovascular: Negative.    Gastrointestinal: Negative.   Endocrine: Negative.   Genitourinary: Positive for vaginal bleeding and menstrual problem. Negative for vaginal discharge.       Heavy menses  Musculoskeletal: Positive for back pain.       LBP x 2 days  Skin: Negative.   Neurological: Negative.        Lt sciatica x 2 days  Hematological: Negative.     Objective:   Physical Exam  Constitutional: She is oriented to person, place, and time.  Over nourished  HENT:  Head: Normocephalic and atraumatic.  Right Ear: External ear normal.  Left Ear: External ear normal.  Nose: Nose normal.  Mouth/Throat: Oropharynx is clear and moist. No oropharyngeal exudate.  Eyes: EOM are normal. Pupils are equal, round, and reactive to light.  Neck: Normal range of motion. Neck supple. No JVD present. No thyromegaly present.  Cardiovascular: Normal rate, regular rhythm and normal heart sounds.   No murmur heard. Pulmonary/Chest: Effort normal and breath sounds normal. No respiratory distress. She has no wheezes. She has no rales.  Abdominal: Soft. Bowel sounds are normal.  Musculoskeletal: Normal range of motion.  Neg SLR, Lt  Lymphadenopathy:    She has no cervical adenopathy.  Neurological: She is alert and oriented to person, place, and time. She has normal reflexes. No cranial nerve deficit.  Skin: Skin is warm and dry. No rash noted. No erythema.  Psychiatric: She has a normal mood and affect.   Assessment & Plan:   1. Fever, unspecified  - Urine Microscopic - Urine culture  2. Anemia, unspecified  - POC Hemoccult Bld/Stl (3-Cd Home Screen); Future  4. Vitamin D Deficiency

## 2013-09-30 NOTE — Patient Instructions (Signed)
Iron Deficiency Anemia, Adult  Anemia is a condition in which there are less red blood cells or hemoglobin in the blood than normal. Hemoglobin is this part of red blood cells that carries oxygen. Iron deficiency anemia is anemia caused by too little iron. It is the most common type of anemia. It may leave you tired and short of breath.  CAUSES    Lack of iron in the diet.   Poor absorption of iron, as seen with intestinal disorders.   Intestinal bleeding.   Heavy periods.  SIGNS AND SYMPTOMS   Mild anemia may not be noticeable. Symptoms may include:   Fatigue.   Headache.   Pale skin.   Weakness.   Tiredness.   Shortness of breath.   Dizziness.   Cold hands and feet.   Fast or irregular heartbeat.  DIAGNOSIS   Diagnosis requires a thorough evaluation and physical exam by your health care provider. Blood tests are generally used to confirm iron deficiency anemia. Additional tests may be done to find the underlying cause of your anemia. These may include:   Testing for blood in the stool (fecal occult blood test).   A procedure to see inside the colon and rectum (colonoscopy).   A procedure to see inside the esophagus and stomach (endoscopy).  TREATMENT   Iron deficiency anemia is treated by correcting the cause of the deficiency. Treatment may involve:   Adding iron-rich foods to your diet.   Taking iron supplements. Pregnant or breastfeeding women need to take extra iron, because their normal diet usually does not provide the required amount.   Taking vitamins. Vitamin C improves the absorption of iron. Your health care provider may recommend taking your iron tablets with a glass of orange juice or vitamin C supplement.   Medicines to make heavy menstrual flow lighter.   Surgery.  HOME CARE INSTRUCTIONS    Take iron as directed by your health care provider.   If you cannot tolerate taking iron supplements by mouth, talk to your health care provider about taking them through a vein  (intravenously) or an injection into a muscle.   For the best iron absorption, iron supplements should be taken on an empty stomach. If you cannot tolerate them on an empty stomach, you may need to take them with food.   Do not drink milk or take antacids at the same time as your iron supplements. Milk and antacids may interfere with the absorption of iron.   Iron supplements can cause constipation. Make sure to include fiber in your diet to prevent constipation. A stool softener may also be recommended.   Take vitamins as directed by your health care provider.   Eat a diet rich in iron. Foods high in iron include liver, lean beef, whole-grain bread, eggs, dried fruit, and dark green, leafy vegetables.  SEEK IMMEDIATE MEDICAL CARE IF:    You faint. If this happens, do not drive. Call your local emergency services (911 in [place] if no other help is available.   You have chest pain.   You feel nauseous or vomit.   You have severe or increased shortness of breath with activity.   You feel weak.   You have a rapid heartbeat.   You have unexplained sweating.   You become lightheaded when getting up from a chair or bed.  MAKE SURE YOU:    Understand these instructions.   Will watch your condition.   Will get help right away if you are not   doing well or get worse.  Document Released: 07/26/2000 Document Revised: 05/19/2013 Document Reviewed: 04/05/2013  [employer] Patient Information 2014 [employer].

## 2013-10-01 ENCOUNTER — Encounter: Payer: Self-pay | Admitting: Cardiovascular Disease

## 2013-10-01 LAB — URINE CULTURE

## 2013-10-06 ENCOUNTER — Other Ambulatory Visit: Payer: Self-pay | Admitting: Emergency Medicine

## 2013-10-11 ENCOUNTER — Encounter: Payer: Self-pay | Admitting: Emergency Medicine

## 2013-10-11 ENCOUNTER — Ambulatory Visit (INDEPENDENT_AMBULATORY_CARE_PROVIDER_SITE_OTHER): Payer: 59 | Admitting: Emergency Medicine

## 2013-10-11 VITALS — BP 124/80 | HR 74 | Temp 98.2°F | Resp 16 | Ht 60.0 in | Wt 206.0 lb

## 2013-10-11 DIAGNOSIS — R5383 Other fatigue: Principal | ICD-10-CM

## 2013-10-11 DIAGNOSIS — R5381 Other malaise: Secondary | ICD-10-CM

## 2013-10-11 LAB — CBC WITH DIFFERENTIAL/PLATELET
BASOS PCT: 0 % (ref 0–1)
Basophils Absolute: 0 10*3/uL (ref 0.0–0.1)
Eosinophils Absolute: 0.1 10*3/uL (ref 0.0–0.7)
Eosinophils Relative: 2 % (ref 0–5)
HCT: 29 % — ABNORMAL LOW (ref 36.0–46.0)
Hemoglobin: 9 g/dL — ABNORMAL LOW (ref 12.0–15.0)
Lymphocytes Relative: 40 % (ref 12–46)
Lymphs Abs: 2.1 10*3/uL (ref 0.7–4.0)
MCH: 22.2 pg — ABNORMAL LOW (ref 26.0–34.0)
MCHC: 31 g/dL (ref 30.0–36.0)
MCV: 71.6 fL — ABNORMAL LOW (ref 78.0–100.0)
MONOS PCT: 9 % (ref 3–12)
Monocytes Absolute: 0.5 10*3/uL (ref 0.1–1.0)
NEUTROS ABS: 2.6 10*3/uL (ref 1.7–7.7)
NEUTROS PCT: 49 % (ref 43–77)
Platelets: 422 10*3/uL — ABNORMAL HIGH (ref 150–400)
RBC: 4.05 MIL/uL (ref 3.87–5.11)
RDW: 20 % — AB (ref 11.5–15.5)
WBC: 5.3 10*3/uL (ref 4.0–10.5)

## 2013-10-11 NOTE — Patient Instructions (Signed)

## 2013-10-11 NOTE — Progress Notes (Signed)
Subjective:    Patient ID: Alexis Marquez, female    DOB: 06-14-1971, 43 y.o.   MRN: [id number]  HPI Comments: 43 yo AAF with BP/ elevated BS HX without DM and Chronic anemia presents with shakes/ HA and weakness x 3 days. She notes she has been skipping meals but has not been checking BS when has episodes. She has increased MF AD. BS 121 today. BP has been good. She started Iron pills per hematology x 1 week.   Hypertension Associated symptoms include headaches.  Headache  Her past medical history is significant for hypertension.    Current Outpatient Prescriptions on File Prior to Visit  Medication Sig Dispense Refill  . benazepril-hydrochlorthiazide (LOTENSIN HCT) 20-25 MG per tablet TAKE ONE TABLET BY MOUTH ONCE DAILY (NEEDS  VISIT  BEFORE  REFILL  RUNS  OUT)  30 tablet  3  . ergocalciferol (VITAMIN D2) 50000 UNITS capsule Take 50,000 Units by mouth. Monday, Wednesday and Friday      . esomeprazole (NEXIUM) 40 MG capsule Take 40 mg by mouth as needed.       Marland Kitchen HEMOCYTE 324 MG TABS tablet Take 1 tablet twice a day on empty stomach with OJ or Vitamin C tab  60 each  4  . levalbuterol (XOPENEX HFA) 45 MCG/ACT inhaler Inhale 2 puffs into the lungs every 4 (four) hours as needed for wheezing. For shortness of breath  1 Inhaler  99  . metFORMIN (GLUCOPHAGE-XR) 500 MG 24 hr tablet 1,000 mg daily.      . pravastatin (PRAVACHOL) 40 MG tablet Take 40 mg by mouth daily.       No current facility-administered medications on file prior to visit.   No Known Allergies Past Medical History  Diagnosis Date  . Hypertension   . High cholesterol   . Asthma   . DM (diabetes mellitus)     borderline  . Low blood potassium   . Iron deficiency anemia   . Allergic rhinitis      Review of Systems  Constitutional: Positive for fatigue.  Neurological: Positive for headaches.  All other systems reviewed and are negative.   BP 124/80  Pulse 74  Temp(Src) 98.2 F (36.8 C) (Temporal)  Resp  16  Ht 5' (1.524 m)  Wt 206 lb (93.441 kg)  BMI 40.23 kg/m2  LMP 10/08/2013     Objective:   Physical Exam  Nursing note and vitals reviewed. Constitutional: She is oriented to person, place, and time. She appears well-developed and well-nourished. No distress.  HENT:  Head: Normocephalic and atraumatic.  Right Ear: External ear normal.  Left Ear: External ear normal.  Nose: Nose normal.  Mouth/Throat: Oropharynx is clear and moist. No oropharyngeal exudate.  Eyes: Conjunctivae and EOM are normal.  Neck: Normal range of motion. Neck supple. No JVD present. No thyromegaly present.  Cardiovascular: Normal rate, regular rhythm, normal heart sounds and intact distal pulses.   Pulmonary/Chest: Effort normal and breath sounds normal.  Abdominal: Soft. Bowel sounds are normal. She exhibits no distension and no mass. There is no tenderness. There is no rebound and no guarding.  Musculoskeletal: Normal range of motion. She exhibits no edema and no tenderness.  Lymphadenopathy:    She has no cervical adenopathy.  Neurological: She is alert and oriented to person, place, and time. No cranial nerve deficit.  Skin: Skin is warm and dry. No rash noted. No erythema. No pallor.  Psychiatric: She has a normal mood and affect. Her behavior  is normal. Judgment and thought content normal.          Assessment & Plan:  1. Fatigue vs hypoglycemia vs anemia- check labs, increase activity and H2O, eat every 2 hours small healthy portions, call with results. w/c if SX increase or ER.  2. HTN- Check BP call if >130/80, increase cardio

## 2013-10-12 ENCOUNTER — Other Ambulatory Visit: Payer: Self-pay | Admitting: Emergency Medicine

## 2013-10-12 LAB — BASIC METABOLIC PANEL WITH GFR
BUN: 8 mg/dL (ref 6–23)
CALCIUM: 8.9 mg/dL (ref 8.4–10.5)
CO2: 25 mEq/L (ref 19–32)
CREATININE: 0.69 mg/dL (ref 0.50–1.10)
Chloride: 103 mEq/L (ref 96–112)
GLUCOSE: 99 mg/dL (ref 70–99)
Potassium: 3.4 mEq/L — ABNORMAL LOW (ref 3.5–5.3)
Sodium: 137 mEq/L (ref 135–145)

## 2013-10-12 MED ORDER — POTASSIUM CHLORIDE ER 10 MEQ PO TBCR: 10.0000 meq | EXTENDED_RELEASE_TABLET | Freq: Two times a day (BID) | ORAL | Status: DC

## 2013-10-14 ENCOUNTER — Encounter: Payer: Self-pay | Admitting: Emergency Medicine

## 2013-10-28 ENCOUNTER — Other Ambulatory Visit: Payer: Self-pay | Admitting: Emergency Medicine

## 2013-10-28 DIAGNOSIS — R6889 Other general symptoms and signs: Secondary | ICD-10-CM

## 2013-10-29 ENCOUNTER — Other Ambulatory Visit: Payer: 59

## 2013-10-29 DIAGNOSIS — R6889 Other general symptoms and signs: Secondary | ICD-10-CM

## 2013-10-29 LAB — CBC WITH DIFFERENTIAL/PLATELET
BASOS ABS: 0 10*3/uL (ref 0.0–0.1)
Basophils Relative: 0 % (ref 0–1)
EOS PCT: 2 % (ref 0–5)
Eosinophils Absolute: 0.1 10*3/uL (ref 0.0–0.7)
HCT: 32.8 % — ABNORMAL LOW (ref 36.0–46.0)
Hemoglobin: 10.5 g/dL — ABNORMAL LOW (ref 12.0–15.0)
LYMPHS PCT: 48 % — AB (ref 12–46)
Lymphs Abs: 2.3 10*3/uL (ref 0.7–4.0)
MCH: 23.9 pg — ABNORMAL LOW (ref 26.0–34.0)
MCHC: 32 g/dL (ref 30.0–36.0)
MCV: 74.7 fL — ABNORMAL LOW (ref 78.0–100.0)
Monocytes Absolute: 0.4 10*3/uL (ref 0.1–1.0)
Monocytes Relative: 8 % (ref 3–12)
NEUTROS PCT: 42 % — AB (ref 43–77)
Neutro Abs: 2 10*3/uL (ref 1.7–7.7)
PLATELETS: 266 10*3/uL (ref 150–400)
RBC: 4.39 MIL/uL (ref 3.87–5.11)
RDW: 22 % — ABNORMAL HIGH (ref 11.5–15.5)
WBC: 4.7 10*3/uL (ref 4.0–10.5)

## 2013-10-29 LAB — BASIC METABOLIC PANEL WITH GFR
BUN: 7 mg/dL (ref 6–23)
CALCIUM: 8.9 mg/dL (ref 8.4–10.5)
CO2: 27 mEq/L (ref 19–32)
Chloride: 103 mEq/L (ref 96–112)
Creat: 0.58 mg/dL (ref 0.50–1.10)
GFR, Est Non African American: >89 mL/min
GLUCOSE: 95 mg/dL (ref 70–99)
Potassium: 3.5 mEq/L (ref 3.5–5.3)
SODIUM: 138 meq/L (ref 135–145)

## 2013-11-16 ENCOUNTER — Encounter: Payer: Self-pay | Admitting: Emergency Medicine

## 2013-11-21 ENCOUNTER — Other Ambulatory Visit: Payer: Self-pay | Admitting: Oncology

## 2013-11-22 ENCOUNTER — Other Ambulatory Visit (HOSPITAL_BASED_OUTPATIENT_CLINIC_OR_DEPARTMENT_OTHER): Payer: 59

## 2013-11-22 ENCOUNTER — Encounter: Payer: Self-pay | Admitting: Oncology

## 2013-11-22 ENCOUNTER — Ambulatory Visit (HOSPITAL_BASED_OUTPATIENT_CLINIC_OR_DEPARTMENT_OTHER): Payer: 59 | Admitting: Oncology

## 2013-11-22 ENCOUNTER — Telehealth: Payer: Self-pay | Admitting: Oncology

## 2013-11-22 VITALS — BP 118/79 | HR 65 | Temp 98.4°F | Resp 18 | Ht 60.0 in | Wt 201.9 lb

## 2013-11-22 DIAGNOSIS — D500 Iron deficiency anemia secondary to blood loss (chronic): Secondary | ICD-10-CM

## 2013-11-22 DIAGNOSIS — N920 Excessive and frequent menstruation with regular cycle: Secondary | ICD-10-CM

## 2013-11-22 DIAGNOSIS — I10 Essential (primary) hypertension: Secondary | ICD-10-CM

## 2013-11-22 DIAGNOSIS — E669 Obesity, unspecified: Secondary | ICD-10-CM

## 2013-11-22 DIAGNOSIS — D509 Iron deficiency anemia, unspecified: Secondary | ICD-10-CM

## 2013-11-22 DIAGNOSIS — E119 Type 2 diabetes mellitus without complications: Secondary | ICD-10-CM

## 2013-11-22 LAB — CBC WITH DIFFERENTIAL/PLATELET
BASO%: 0.2 % (ref 0.0–2.0)
Basophils Absolute: 0 10*3/uL (ref 0.0–0.1)
EOS%: 2.7 % (ref 0.0–7.0)
Eosinophils Absolute: 0.2 10*3/uL (ref 0.0–0.5)
HCT: 36.8 % (ref 34.8–46.6)
HGB: 11.6 g/dL (ref 11.6–15.9)
LYMPH#: 2.5 10*3/uL (ref 0.9–3.3)
LYMPH%: 45.2 % (ref 14.0–49.7)
MCH: 24.9 pg — AB (ref 25.1–34.0)
MCHC: 31.5 g/dL (ref 31.5–36.0)
MCV: 79.1 fL — ABNORMAL LOW (ref 79.5–101.0)
MONO#: 0.3 10*3/uL (ref 0.1–0.9)
MONO%: 6.2 % (ref 0.0–14.0)
NEUT#: 2.5 10*3/uL (ref 1.5–6.5)
NEUT%: 45.7 % (ref 38.4–76.8)
Platelets: 246 10*3/uL (ref 145–400)
RBC: 4.65 10*6/uL (ref 3.70–5.45)
RDW: 19.1 % — ABNORMAL HIGH (ref 11.2–14.5)
WBC: 5.5 10*3/uL (ref 3.9–10.3)

## 2013-11-22 NOTE — Patient Instructions (Addendum)
Continue iron twice daily on empty stomach with orange juice thru July. If your hemoglobin is good then and bleeding is not extremely heavy, you can decrease to one tablet daily then. Have Dr Oneta RackMcKeown or Dr Juliene PinaMody check your blood counts in ~ July then every 6 months.  Dr Darrold SpanLivesay is glad to see you back at any time if needed.  Dr Mody's office will call you at [phone] about an appointment before May 1, since you are out of work until then. If you do not hear from that office by tomorrow, call them yourself [phone], ask to speak to Dr Detar NorthMody's nurse, and explain that you are overdue for follow up appointment and would be able to do this before May 1. Dr Darrold SpanLivesay is glad to speak to that office again if needed.  You need to do the stool cards to check for blood. Do not do them while you are having period. One card for each of 3 bowel movements, use toilet tissue after you have wiped to put specimen onto card. These need to be taken to doctor's office within a week of doing the cards.

## 2013-11-22 NOTE — Telephone Encounter (Signed)
per [facility] sch appt for 4/21-gave pt copy of new sch-pt understood °

## 2013-11-22 NOTE — Progress Notes (Signed)
OFFICE PROGRESS NOTE   11/22/2013   Physicians:WIlliam McKeown, Melvia Heapsobert Kaplan, C.McAlhany, Shea EvansVaishali Mody   INTERVAL HISTORY:  Patient is seen, alone for visit, in continuing attention to iron deficiency anemia which appears secondary to extremely heavy menstrual bleeding and which is responding to increase in oral iron. She has been on Hemocyte 325 mg since 09-27-13, taking this twice daily on empty stomach with Vitamin C tablet. She is tolerating this iron preparation with no difficulty and has had progressive improvement in hemoglobin and MCV even with ongoing regular menses.  She is overdue follow up with Dr Juliene PinaMody (visit with US there in 05-2013), but is out of work until May 1 and is hoping to be seen prior to return to work; I have spoken directly with Dr Camillia HerterMody's office now and understand that her nurse will call patient to set up appointment.  Patient states that she is feeling somewhat less tired since beginning the oral iron. She denies SOB walking into office. LMP was ~ 4 weeks ago, still very heavy, no other bleeding. She has not done hemoccult cards yet, and I have given 3 cards to her with instructions now.   HISTORY Patient reported anemia x years when seen in consultation at this office 09-27-13, without very regular po iron use prior to that and no IV iron. Hemoglobin electrophoresis 04-2013 was normal. Hgb had been ~ 9.4 - 9.6 in records back to 2013, with MCVs ~ 69 and plt ~ 400k. She reported very heavy menses then, had also seen GI, but no endoscopy. Anemia labs 09-17-2013 had serum iron 35, %sat 8, ferritin 2, folate/B12/ANA normal, retics 3.2%. She was begun on Hemocyte 324 mg (name brand covered by insurance) bid on empty stomach with VItamin C tab after my visit mid Feb. Repeat CBC 10-29-13 had Hgb 10.5 and MCV 74.7.   Review of systems as above, also: No recent infectious illness. No new or different pain. No cough. No chest pain. No LE swelling. Bladder ok Remainder of 10 point  Review of Systems negative.  Objective:  Vital signs in last 24 hours:  BP 118/79  Pulse 65  Temp(Src) 98.4 F (36.9 C) (Oral)  Resp 18  Ht 5' (1.524 m)  Wt 201 lb 14.4 oz (91.581 kg)  BMI 39.43 kg/m2  Alert, oriented and appropriate. Ambulatory without difficulty.  Weight is up ~ 2 lbs.  HEENT:PERRL, sclerae not icteric. Oral mucosa moist without lesions, posterior pharynx clear.  Neck supple. No JVD.  Lymphatics:no cervical,suraclavicular, axillary adenopathy Resp: clear to auscultation bilaterally and normal percussion bilaterally Cardio: regular rate and rhythm. No gallop. GI: abdomen obese, soft, nontender, not distended, no appreciable mass or organomegaly. Normally active bowel sounds.  Musculoskeletal/ Extremities: without pitting edema, cords, tenderness Neuro: no peripheral neuropathy. Otherwise nonfocal Skin without rash, ecchymosis, petechiae s  Lab Results:  Results for orders placed in visit on 11/22/13  CBC WITH DIFFERENTIAL      Result Value Ref Range   WBC 5.5  3.9 - 10.3 10e3/uL   NEUT# 2.5  1.5 - 6.5 10e3/uL   HGB 11.6  11.6 - 15.9 g/dL   HCT 78.236.8  95.634.8 - 21.346.6 %   Platelets 246  145 - 400 10e3/uL   MCV 79.1 (*) 79.5 - 101.0 fL   MCH 24.9 (*) 25.1 - 34.0 pg   MCHC 31.5  31.5 - 36.0 g/dL   RBC 0.864.65  5.783.70 - 4.695.45 10e6/uL   RDW 19.1 (*) 11.2 - 14.5 %  lymph# 2.5  0.9 - 3.3 10e3/uL   MONO# 0.3  0.1 - 0.9 10e3/uL   Eosinophils Absolute 0.2  0.0 - 0.5 10e3/uL   Basophils Absolute 0.0  0.0 - 0.1 10e3/uL   NEUT% 45.7  38.4 - 76.8 %   LYMPH% 45.2  14.0 - 49.7 %   MONO% 6.2  0.0 - 14.0 %   EOS% 2.7  0.0 - 7.0 %   BASO% 0.2  0.0 - 2.0 %   CBC reviewed with patient now  Studies/Results:  No results found.  Medications: I have reviewed the patient's current medications. I have recommended that she continue bid Hemocyte as presently thru July, then, if hemoglobin holding well in normal range and MCV normal, would decrease to once daily as long as she  continues menses. I have recommended that Dr Oneta RackMcKeown or Dr Juliene PinaMody check hemoglobin ~ every 6 months from there.  DISCUSSION :  Patient is pleased with improvement in hemoglobin and in agreement with plan above. I have spoken directly with Dr Camillia HerterMody's office, that RN to contact patient to try to schedule appointment in next 2 weeks while she is out of work (as variable work schedule makes physician appointments very difficult for her to schedule). She will return hemoccult cards to one of her physicians within one week of doing those, hopefully to Dr Juliene PinaMody if she is seen there next.   PATIENT INSTRUCTIONS Continue iron twice daily on empty stomach with orange juice thru July. If your hemoglobin is good then and bleeding is not extremely heavy, you can decrease to one tablet daily then. Have Dr Oneta RackMcKeown or Dr Juliene PinaMody check your blood counts in ~ July then every 6 months.  Dr Darrold SpanLivesay is glad to see you back at any time if needed.  Dr Mody's office will call you at [phone] about an appointment before May 1, since you are out of work until then. If you do not hear from that office by tomorrow, call them yourself [phone], [facility], and explain that you are overdue for follow up appointment and would be able to do this before May 1. Dr Darrold SpanLivesay is glad to speak to that office again if needed.  You need to do the stool cards to check for blood. Do not do them while you are having period. One card for each of 3 bowel movements, use toilet tissue after you have wiped to put specimen onto card. These need to be taken to doctor's office within a week of doing the cards.    Assessment/Plan: 1.Iron deficiency anemia: clinically secondary to menorrhagia, tho still needs to do hemoccult cards. Improving well on oral Hemocyte with Vit C on empty stomach bid. Plan as above. Prn follow up at this office 2.menorrhagia: [facility] 3.Obesity 4.HTN, elevated  lipids 5.diabetes 6.asthma         Reece PackerLennis P Jujuan Dugo, MD   11/22/2013, 10:01 AM

## 2013-11-24 ENCOUNTER — Encounter: Payer: Self-pay | Admitting: Emergency Medicine

## 2013-11-29 DIAGNOSIS — Z0000 Encounter for general adult medical examination without abnormal findings: Secondary | ICD-10-CM

## 2013-12-18 ENCOUNTER — Other Ambulatory Visit: Payer: Self-pay | Admitting: Internal Medicine

## 2014-02-16 ENCOUNTER — Other Ambulatory Visit: Payer: Self-pay | Admitting: Emergency Medicine

## 2014-02-17 ENCOUNTER — Other Ambulatory Visit: Payer: Self-pay | Admitting: Emergency Medicine

## 2014-02-28 ENCOUNTER — Encounter: Payer: Self-pay | Admitting: Emergency Medicine

## 2014-03-06 ENCOUNTER — Other Ambulatory Visit: Payer: Self-pay | Admitting: Emergency Medicine

## 2014-03-08 ENCOUNTER — Encounter (HOSPITAL_COMMUNITY): Payer: Self-pay | Admitting: Emergency Medicine

## 2014-03-08 ENCOUNTER — Emergency Department (HOSPITAL_COMMUNITY)
Admission: EM | Admit: 2014-03-08 | Discharge: 2014-03-08 | Disposition: A | Discharge status: Home / Self Care | Payer: 59 | Attending: Emergency Medicine | Admitting: Emergency Medicine

## 2014-03-08 DIAGNOSIS — Z862 Personal history of diseases of the blood and blood-forming organs and certain disorders involving the immune mechanism: Secondary | ICD-10-CM | POA: Insufficient documentation

## 2014-03-08 DIAGNOSIS — R112 Nausea with vomiting, unspecified: Secondary | ICD-10-CM | POA: Diagnosis not present

## 2014-03-08 DIAGNOSIS — R51 Headache: Secondary | ICD-10-CM | POA: Insufficient documentation

## 2014-03-08 DIAGNOSIS — R519 Headache, unspecified: Secondary | ICD-10-CM

## 2014-03-08 DIAGNOSIS — I10 Essential (primary) hypertension: Secondary | ICD-10-CM | POA: Diagnosis not present

## 2014-03-08 DIAGNOSIS — Z79899 Other long term (current) drug therapy: Secondary | ICD-10-CM | POA: Diagnosis not present

## 2014-03-08 DIAGNOSIS — E119 Type 2 diabetes mellitus without complications: Secondary | ICD-10-CM | POA: Insufficient documentation

## 2014-03-08 DIAGNOSIS — Z3202 Encounter for pregnancy test, result negative: Secondary | ICD-10-CM | POA: Insufficient documentation

## 2014-03-08 DIAGNOSIS — E7800 Pure hypercholesterolemia, unspecified: Secondary | ICD-10-CM | POA: Insufficient documentation

## 2014-03-08 DIAGNOSIS — J45909 Unspecified asthma, uncomplicated: Secondary | ICD-10-CM | POA: Diagnosis not present

## 2014-03-08 DIAGNOSIS — J3489 Other specified disorders of nose and nasal sinuses: Secondary | ICD-10-CM | POA: Insufficient documentation

## 2014-03-08 LAB — POC URINE PREG, ED: PREG TEST UR: NEGATIVE

## 2014-03-08 MED ORDER — METOCLOPRAMIDE HCL 5 MG/ML IJ SOLN
10.0000 mg | Freq: Once | INTRAMUSCULAR | Status: AC
  Administered 2014-03-08: 10 mg via INTRAVENOUS
  Filled 2014-03-08: qty 2

## 2014-03-08 MED ORDER — DIPHENHYDRAMINE HCL 50 MG/ML IJ SOLN
25.0000 mg | Freq: Once | INTRAMUSCULAR | Status: AC
  Administered 2014-03-08: 25 mg via INTRAVENOUS
  Filled 2014-03-08: qty 1

## 2014-03-08 MED ORDER — SODIUM CHLORIDE 0.9 % IV BOLUS (SEPSIS)
1000.0000 mL | Freq: Once | INTRAVENOUS | Status: AC
  Administered 2014-03-08: 1000 mL via INTRAVENOUS

## 2014-03-08 NOTE — ED Provider Notes (Signed)
CSN: [id number]     Arrival date & time 03/08/14  0645 History   First MD Initiated Contact with Patient 03/08/14 [phone]     Chief Complaint  Patient presents with  . Headache     (Consider location/radiation/quality/duration/timing/severity/associated sxs/prior Treatment) Patient is a 43 y.o. female presenting with headaches.  Headache Pain location:  L temporal Quality:  Dull (throbbing) Severity currently:  10/10 Onset quality:  Gradual Duration:  2 days Timing:  Constant Progression:  Worsening Chronicity:  New Context comment:  Spontaneous Relieved by:  Nothing Worsened by:  Nothing tried Ineffective treatments:  NSAIDs Associated symptoms: congestion, nausea and vomiting   Associated symptoms: no blurred vision, no fever, no focal weakness, no neck pain, no neck stiffness, no numbness, no paresthesias, no photophobia and no visual change     Past Medical History  Diagnosis Date  . Hypertension   . High cholesterol   . Asthma   . DM (diabetes mellitus)     borderline  . Low blood potassium   . Iron deficiency anemia   . Allergic rhinitis    Past Surgical History  Procedure Laterality Date  . Cesarean section      x 4  . Umbilical hernia repair  1977  . Tubal ligation  1996   Family History  Problem Relation Age of Onset  . Breast cancer Maternal Aunt   . Diabetes Mother   . Diabetes Maternal Aunt   . Heart disease Maternal Grandmother     great grandmother  . Stroke Mother   . CAD Neg Hx    History  Substance Use Topics  . Smoking status: Never Smoker   . Smokeless tobacco: Never Used  . Alcohol Use: No   OB History   Grav Para Term Preterm Abortions TAB SAB Ect Mult Living                 Review of Systems  Constitutional: Negative for fever.  HENT: Positive for congestion.   Eyes: Negative for blurred vision and photophobia.  Gastrointestinal: Positive for nausea and vomiting.  Musculoskeletal: Negative for neck pain and neck stiffness.   Neurological: Positive for headaches. Negative for focal weakness, numbness and paresthesias.  All other systems reviewed and are negative.     Allergies  Review of patient's allergies indicates no known allergies.  Home Medications   Prior to Admission medications   Medication Sig Start Date End Date Taking? Authorizing Provider  benazepril-hydrochlorthiazide (LOTENSIN HCT) 20-25 MG per tablet Take 1 tablet by mouth daily.   Yes Historical Provider, MD  cholecalciferol (VITAMIN D) 1000 UNITS tablet Take 1,000 Units by mouth daily.   Yes Historical Provider, MD  pravastatin (PRAVACHOL) 40 MG tablet Take 40 mg by mouth daily.   Yes Historical Provider, MD  levalbuterol [facility] HFA) 45 MCG/ACT inhaler Inhale 2 puffs into the lungs every 4 (four) hours as needed for wheezing. For shortness of breath 09/07/13   Lucky Cowboy, MD  metFORMIN (GLUCOPHAGE-XR) 500 MG 24 hr tablet Take 500 mg by mouth daily.  09/07/13 09/07/14  Lucky Cowboy, MD   BP 143/79  Pulse 49  Temp(Src) 97.6 F (36.4 C) (Oral)  Resp 14  Ht 5' (1.524 m)  Wt 198 lb (89.812 kg)  BMI 38.67 kg/m2  SpO2 100%  LMP 03/06/2014 Physical Exam  Nursing note and vitals reviewed. Constitutional: She is oriented to person, place, and time. She appears well-developed and well-nourished. No distress.  HENT:  Head: Normocephalic and atraumatic.  Mouth/Throat: Oropharynx is clear and moist.  Eyes: Conjunctivae are normal. Pupils are equal, round, and reactive to light. No scleral icterus.  Neck: Neck supple.  Cardiovascular: Normal rate, regular rhythm, normal heart sounds and intact distal pulses.   No murmur heard. Pulmonary/Chest: Effort normal and breath sounds normal. No stridor. No respiratory distress. She has no rales.  Abdominal: Soft. Bowel sounds are normal. She exhibits no distension. There is no tenderness.  Musculoskeletal: Normal range of motion.  Neurological: She is alert and oriented to person, place, and  time. She has normal strength. No cranial nerve deficit or sensory deficit. Coordination and gait normal. GCS eye subscore is 4. GCS verbal subscore is 5. GCS motor subscore is 6.  Skin: Skin is warm and dry. No rash noted.  Psychiatric: She has a normal mood and affect. Her behavior is normal.    ED Course  Procedures (including critical care time) Labs Review Labs Reviewed  POC URINE PREG, ED    Imaging Review No results found.   EKG Interpretation None      MDM   Final diagnoses:  Nonintractable headache, unspecified chronicity pattern, unspecified headache type    43 yo female with headache.  Primarily behind left eye.  Throbbing with nausea and vomiting.  No neck stiffness, fevers, meningeal signs, nor confusion.  Not sudden onset nor thunderclap.  Neuro exam normal.  Don't think she needs CT imaging at this time.  Plan treatment with IV metoclopramide, diphenhydramine, dexamethasone, and fluids.    10:26 AM Headache completely resolved.  Plan DC home.   Candyce ChurnJohn David Lelar Farewell III, MD 03/08/14 1026

## 2014-03-08 NOTE — ED Notes (Signed)
Pt arrives with c/o headache that has been present since Sunday, tried hydrocone at 0200 this AM with no relief. Pain 10/10. Pain on left side, positive nausea/vomting. No hx of migranes. No sensitivity to light/sound.

## 2014-03-08 NOTE — Discharge Instructions (Signed)

## 2014-03-18 ENCOUNTER — Ambulatory Visit (INDEPENDENT_AMBULATORY_CARE_PROVIDER_SITE_OTHER): Payer: 59 | Admitting: Physician Assistant

## 2014-03-18 ENCOUNTER — Encounter: Payer: Self-pay | Admitting: Physician Assistant

## 2014-03-18 VITALS — BP 120/78 | HR 64 | Temp 97.7°F | Resp 16 | Ht 60.0 in | Wt 200.0 lb

## 2014-03-18 DIAGNOSIS — E119 Type 2 diabetes mellitus without complications: Secondary | ICD-10-CM

## 2014-03-18 DIAGNOSIS — Z79899 Other long term (current) drug therapy: Secondary | ICD-10-CM

## 2014-03-18 DIAGNOSIS — G4733 Obstructive sleep apnea (adult) (pediatric): Secondary | ICD-10-CM

## 2014-03-18 DIAGNOSIS — I10 Essential (primary) hypertension: Secondary | ICD-10-CM

## 2014-03-18 DIAGNOSIS — R001 Bradycardia, unspecified: Secondary | ICD-10-CM

## 2014-03-18 DIAGNOSIS — Z0000 Encounter for general adult medical examination without abnormal findings: Secondary | ICD-10-CM

## 2014-03-18 LAB — CBC WITH DIFFERENTIAL/PLATELET
Basophils Absolute: 0 10*3/uL (ref 0.0–0.1)
Basophils Relative: 0 % (ref 0–1)
Eosinophils Absolute: 0.2 10*3/uL (ref 0.0–0.7)
Eosinophils Relative: 3 % (ref 0–5)
HCT: 35.3 % — ABNORMAL LOW (ref 36.0–46.0)
Hemoglobin: 11.7 g/dL — ABNORMAL LOW (ref 12.0–15.0)
LYMPHS ABS: 2.8 10*3/uL (ref 0.7–4.0)
Lymphocytes Relative: 53 % — ABNORMAL HIGH (ref 12–46)
MCH: 26.6 pg (ref 26.0–34.0)
MCHC: 33.1 g/dL (ref 30.0–36.0)
MCV: 80.2 fL (ref 78.0–100.0)
MONO ABS: 0.4 10*3/uL (ref 0.1–1.0)
Monocytes Relative: 7 % (ref 3–12)
NEUTROS ABS: 1.9 10*3/uL (ref 1.7–7.7)
NEUTROS PCT: 37 % — AB (ref 43–77)
Platelets: 350 10*3/uL (ref 150–400)
RBC: 4.4 MIL/uL (ref 3.87–5.11)
RDW: 13.6 % (ref 11.5–15.5)
WBC: 5.2 10*3/uL (ref 4.0–10.5)

## 2014-03-18 NOTE — Progress Notes (Signed)
Complete Physical  Assessment and Plan: Hypertension-- continue medications, DASH diet, exercise and monitor at home. Call if greater than 130/80.   High cholesterol--continue medications, check lipids, decrease fatty foods, increase activity.   Asthma- continue medications PRN, controlled  DM (diabetes mellitus)- continue MF, Discussed general issues about diabetes pathophysiology and management., Educational material distributed., Suggested low cholesterol diet., Encouraged aerobic exercise., Discussed foot care., Reminded to get yearly retinal exam. GET EYE EXA  Low blood potassium- check BMP  Iron deficiency anemia- check CBC, iron, Ferritin, get back on iron QD with vitamin C  Allergic rhinitis- Allegra OTC, increase H20, allergy hygiene explained.  FBD- suggest 3D MGM Get eye exam Obesity with co morbidities- long discussion about weight loss, diet, and exercise Asymptomatic sinus bradycardia- check labs, with body habitus, rotating shifts, and crowded mouth suggest sleep study  Discussed med's effects and SE's. Screening labs and tests as requested with regular follow-up as recommended.  HPI 43 y.o. female  presents for a complete physical and 3 month follow up.  She has had elevated blood pressure for at least 10 years. Her blood pressure has been controlled at home, today their BP is BP: 120/78 mmHg She does not workout, she works 12 hours 4 days a week and it is difficult for her to get motivated, and can do rotating shifts.  She denies chest pain, shortness of breath, dizziness.  She is on cholesterol medication and denies myalgias. Her cholesterol is at goal. The cholesterol last visit was:   Lab Results  Component Value Date   CHOL 159 09/07/2013   HDL 41 09/07/2013   LDLCALC 101* 09/07/2013   TRIG 83 09/07/2013   CHOLHDL 3.9 09/07/2013    She has been working on diet and exercise for diabetes, she is on an ACE and on metformin 500mg  once daily, and denies paresthesia of the  feet, polydipsia and polyuria. Last A1C in the office was:  Lab Results  Component Value Date   HGBA1C 5.9* 09/07/2013   Patient is on Vitamin D supplement.   Lab Results  Component Value Date   VD25OH 32 09/07/2013     She has seen cardio, GI, her OB/GYN and Dr. Darrold Span for anemia. Her anemia has improved and she was on iron 1 pill daily with vitamin C however she has not been on it since June due to cost.  Lab Results  Component Value Date   WBC 5.5 11/22/2013   HGB 11.6 11/22/2013   HCT 36.8 11/22/2013   MCV 79.1* 11/22/2013   PLT 246 11/22/2013    Current Medications:  Current Outpatient Prescriptions on File Prior to Visit  Medication Sig Dispense Refill  . benazepril-hydrochlorthiazide (LOTENSIN HCT) 20-25 MG per tablet Take 1 tablet by mouth daily.      . cholecalciferol (VITAMIN D) 1000 UNITS tablet Take 1,000 Units by mouth daily.      Marland Kitchen levalbuterol (XOPENEX HFA) 45 MCG/ACT inhaler Inhale 2 puffs into the lungs every 4 (four) hours as needed for wheezing. For shortness of breath  1 Inhaler  99  . metFORMIN (GLUCOPHAGE-XR) 500 MG 24 hr tablet Take 500 mg by mouth daily.       . pravastatin (PRAVACHOL) 40 MG tablet Take 40 mg by mouth daily.       No current facility-administered medications on file prior to visit.   Health Maintenance:   Immunization History  Administered Date(s) Administered  . Pneumococcal-Unspecified 08/13/2007  . Td 11/10/2005   Tetanus: 2007 Pneumovax: 2009  Flu vaccine: at [facility] 2014 Zostavax: N/A Pap: June 2014 LMP 2 weeks ago, very heavy MGM: 2011 DUE  DEXA: N/A Colonoscopy: N/A EGD: N/A Echo 06/2013  Patient Care Team: Lucky Cowboy, MD as PCP - General (Internal Medicine) Robley Fries, MD as Consulting Physician (Obstetrics and Gynecology) Louis Meckel, MD as Consulting Physician (Gastroenterology) Reece Packer, MD as Consulting Physician (Oncology) Kathleene Hazel, MD as Consulting Physician  (Cardiology)   Allergies: No Known Allergies Medical History:  Past Medical History  Diagnosis Date  . Hypertension   . High cholesterol   . Asthma   . DM (diabetes mellitus)     borderline  . Low blood potassium   . Iron deficiency anemia   . Allergic rhinitis    Surgical History:  Past Surgical History  Procedure Laterality Date  . Cesarean section      x 4  . Umbilical hernia repair  1977  . Tubal ligation  1996   Family History:  Family History  Problem Relation Age of Onset  . Breast cancer Maternal Aunt   . Diabetes Mother   . Diabetes Maternal Aunt   . Heart disease Maternal Grandmother     great grandmother  . Stroke Mother   . CAD Neg Hx    Social History:  History  Substance Use Topics  . Smoking status: Never Smoker   . Smokeless tobacco: Never Used  . Alcohol Use: No   Review of Systems: [X]  = complains of  [ ]  = denies  General: Fatigue [ ]  Fever [ ]  Chills [ ]  Weakness [ ]   Insomnia [ ] Weight change [ ]  Night sweats [ ]   Change in appetite [ ]  Eyes: Redness [ ]  Blurred vision Arly.Keller ] Diplopia [ ]  Discharge [ ]   ENT: Congestion [ ]  Sinus Pain [ ]  Post Nasal Drip [ ]  Sore Throat [ ]  Earache [ ]  hearing loss [ ]  Tinnitus [ ]  Snoring [ ]   Cardiac: Chest pain/pressure [ ]  SOB [ ]  Orthopnea [ ]   Palpitations [ ]   Paroxysmal nocturnal dyspnea[ ]  Claudication [ ]  Edema [ ]   Pulmonary: Cough [ ]  Wheezing[ ]   SOB [ ]   Pleurisy [ ]   GI: Nausea [ ]  Vomiting[ ]  Dysphagia[ ]  Heartburn[ ]  Abdominal pain [ ]  Constipation [ ] ; Diarrhea [ ]  BRBPR [ ]  Melena[ ]  Bloating [ ]  Hemorrhoids [ ]   GU: Hematuria[ ]  Dysuria [ ]  Nocturia[ ]  Urgency [ ]   Hesitancy [ ]  Discharge [ ]  Frequency [ ]   Breast:  Breast lumps [ ]   nipple discharge [ ]    Neuro: Headaches[ ]  Vertigo[ ]  Paresthesias[ ]  Spasm [ ]  Speech changes [ ]  Incoordination [ ]   Ortho: Arthritis [ ]  Joint pain [ ]  Muscle pain [ ]  Joint swelling [ ]  Back Pain [ ]  Skin:  Rash [ ]   Pruritis [ ]  Change in skin lesion [ ]    Psych: Depression[ ]  Anxiety[ ]  Confusion [ ]  Memory loss [ ]   Heme/Lypmh: Bleeding [ ]  Bruising [ ]  Enlarged lymph nodes [ ]   Endocrine: Visual blurring [ ]  Paresthesia [ ]  Polyuria [ ]  Polydypsea [ ]    Heat/cold intolerance [ ]  Hypoglycemia [ ]   Physical Exam: Estimated body mass index is 39.06 kg/(m^2) as calculated from the following:   Height as of this encounter: 5' (1.524 m).   Weight as of this encounter: 200 lb (90.719 kg). BP 120/78  Pulse 64  Temp(Src) 97.7 F (36.5 C)  Resp 16  Ht 5' (1.524 m)  Wt 200 lb (90.719 kg)  BMI 39.06 kg/m2  LMP 03/06/2014 General Appearance: Well nourished, in no apparent distress. Eyes: PERRLA, EOMs, conjunctiva no swelling or erythema, normal fundi and vessels on left eye, some decrease visibility right eye Sinuses: No Frontal/maxillary tenderness ENT/Mouth: Ext aud canals clear, normal light reflex with TMs without erythema, bulging.  Good dentition. No erythema, swelling, or exudate on post pharynx. Tonsils not swollen or erythematous. Hearing normal.  Neck: Supple, thyroid normal. No bruits Respiratory: Respiratory effort normal, BS equal bilaterally without rales, rhonchi, wheezing or stridor. Cardio: RRR without murmurs, rubs or gallops. Brisk peripheral pulses without edema.  Chest: symmetric, with normal excursions and percussion. Breasts: Symmetric, with several mobile round bumps, + dense breast,  without nipple discharge, retractions. Abdomen: Soft, +BS. Non tender, no guarding, rebound, hernias, masses, or organomegaly. .  Lymphatics: Non tender without lymphadenopathy.  Genitourinary: defer Musculoskeletal: Full ROM all peripheral extremities,5/5 strength, and normal gait. Skin: Warm, dry without rashes, lesions, ecchymosis. 2x823mm dark nevus mid upper back Neuro: Cranial nerves intact, reflexes equal bilaterally. Normal muscle tone, no cerebellar symptoms. Sensation intact.  Psych: Awake and oriented X 3, normal affect, Insight  and Judgment appropriate.   EKG: sinus bradycardia, asymptomatic, normal Echo, suggest getting sleep study   Quentin MullingCollier, Eisha Chatterjee 10:03 AM

## 2014-03-18 NOTE — Patient Instructions (Addendum)
Terbinafine over the counter, apply twice daily for 1-3 months.  Get eye doctor appointment Get 3D [employer]    Bad carbs also include fruit juice, alcohol, and sweet tea. These are empty calories that do not signal to your brain that you are full.   Please remember the good carbs are still carbs which convert into sugar. So please measure them out no more than 1/2-1 cup of rice, oatmeal, pasta, and beans.  Veggies are however free foods! Pile them on.   I like lean protein at every meal such as chicken, [place]key, pork chops, cottage cheese, etc. Just do not fry these meats and please center your meal around vegetable, the meats should be a side dish.   No all fruit is created equal. Please see the list below, the fruit at the bottom is higher in sugars than the fruit at the top

## 2014-03-19 ENCOUNTER — Other Ambulatory Visit: Payer: Self-pay | Admitting: Physician Assistant

## 2014-03-19 LAB — BASIC METABOLIC PANEL WITH GFR
BUN: 8 mg/dL (ref 6–23)
CALCIUM: 9.3 mg/dL (ref 8.4–10.5)
CO2: 30 meq/L (ref 19–32)
CREATININE: 0.63 mg/dL (ref 0.50–1.10)
Chloride: 99 mEq/L (ref 96–112)
GFR, Est Non African American: >89 mL/min
GLUCOSE: 100 mg/dL — AB (ref 70–99)
Potassium: 3.3 mEq/L — ABNORMAL LOW (ref 3.5–5.3)
Sodium: 140 mEq/L (ref 135–145)

## 2014-03-19 LAB — MAGNESIUM: MAGNESIUM: 1.9 mg/dL (ref 1.5–2.5)

## 2014-03-19 LAB — IRON AND TIBC
%SAT: 17 % — ABNORMAL LOW (ref 20–55)
Iron: 66 ug/dL (ref 42–145)
TIBC: 398 ug/dL (ref 250–470)
UIBC: 332 ug/dL (ref 125–400)

## 2014-03-19 LAB — LIPID PANEL
Cholesterol: 166 mg/dL (ref 0–200)
HDL: 46 mg/dL (ref >39)
LDL CALC: 106 mg/dL — AB (ref 0–99)
Total CHOL/HDL Ratio: 3.6 Ratio
Triglycerides: 72 mg/dL (ref <150)
VLDL: 14 mg/dL (ref 0–40)

## 2014-03-19 LAB — VITAMIN B12: VITAMIN B 12: 688 pg/mL (ref 211–911)

## 2014-03-19 LAB — URINALYSIS, ROUTINE W REFLEX MICROSCOPIC
BILIRUBIN URINE: NEGATIVE
Glucose, UA: NEGATIVE mg/dL
Hgb urine dipstick: NEGATIVE
KETONES UR: NEGATIVE mg/dL
Leukocytes, UA: NEGATIVE
Nitrite: NEGATIVE
PROTEIN: NEGATIVE mg/dL
Specific Gravity, Urine: 1.013 (ref 1.005–1.030)
Urobilinogen, UA: 0.2 mg/dL (ref 0.0–1.0)
pH: 7 (ref 5.0–8.0)

## 2014-03-19 LAB — HEPATIC FUNCTION PANEL
ALK PHOS: 44 U/L (ref 39–117)
ALT: 28 U/L (ref 0–35)
AST: 18 U/L (ref 0–37)
Albumin: 4.4 g/dL (ref 3.5–5.2)
BILIRUBIN DIRECT: 0.1 mg/dL (ref 0.0–0.3)
BILIRUBIN INDIRECT: 0.5 mg/dL (ref 0.2–1.2)
BILIRUBIN TOTAL: 0.6 mg/dL (ref 0.2–1.2)
Total Protein: 7.3 g/dL (ref 6.0–8.3)

## 2014-03-19 LAB — INSULIN, FASTING: Insulin fasting, serum: 37 u[IU]/mL — ABNORMAL HIGH (ref 3–28)

## 2014-03-19 LAB — MICROALBUMIN / CREATININE URINE RATIO
Creatinine, Urine: 78 mg/dL
Microalb Creat Ratio: 7.7 mg/g (ref 0.0–30.0)
Microalb, Ur: 0.6 mg/dL (ref 0.00–1.89)

## 2014-03-19 LAB — TSH: TSH: 0.608 u[IU]/mL (ref 0.350–4.500)

## 2014-03-19 LAB — VITAMIN D 25 HYDROXY (VIT D DEFICIENCY, FRACTURES): Vit D, 25-Hydroxy: 32 ng/mL (ref 30–89)

## 2014-03-19 LAB — HEMOGLOBIN A1C
Hgb A1c MFr Bld: 5.6 % (ref <5.7)
MEAN PLASMA GLUCOSE: 114 mg/dL (ref <117)

## 2014-03-19 LAB — FERRITIN: Ferritin: 12 ng/mL (ref 10–291)

## 2014-03-19 MED ORDER — POTASSIUM CHLORIDE CRYS ER 20 MEQ PO TBCR: 20.0000 meq | EXTENDED_RELEASE_TABLET | Freq: Every day | ORAL | Status: DC

## 2014-04-14 ENCOUNTER — Other Ambulatory Visit: Payer: Self-pay | Admitting: Internal Medicine

## 2014-06-03 ENCOUNTER — Other Ambulatory Visit: Payer: Self-pay

## 2014-06-03 DIAGNOSIS — Z1231 Encounter for screening mammogram for malignant neoplasm of breast: Secondary | ICD-10-CM

## 2014-06-24 ENCOUNTER — Ambulatory Visit
Admission: RE | Admit: 2014-06-24 | Discharge: 2014-06-24 | Disposition: A | Discharge status: Home / Self Care | Payer: 59 | Source: Ambulatory Visit

## 2014-06-24 DIAGNOSIS — Z1231 Encounter for screening mammogram for malignant neoplasm of breast: Secondary | ICD-10-CM

## 2014-07-15 ENCOUNTER — Emergency Department (HOSPITAL_COMMUNITY)
Admission: EM | Admit: 2014-07-15 | Discharge: 2014-07-15 | Disposition: A | Discharge status: Home / Self Care | Payer: 59 | Attending: Emergency Medicine | Admitting: Emergency Medicine

## 2014-07-15 ENCOUNTER — Encounter (HOSPITAL_COMMUNITY): Payer: Self-pay | Admitting: *Deleted

## 2014-07-15 DIAGNOSIS — I10 Essential (primary) hypertension: Secondary | ICD-10-CM | POA: Insufficient documentation

## 2014-07-15 DIAGNOSIS — Z79899 Other long term (current) drug therapy: Secondary | ICD-10-CM | POA: Diagnosis not present

## 2014-07-15 DIAGNOSIS — E119 Type 2 diabetes mellitus without complications: Secondary | ICD-10-CM | POA: Diagnosis not present

## 2014-07-15 DIAGNOSIS — E780 Pure hypercholesterolemia: Secondary | ICD-10-CM | POA: Diagnosis not present

## 2014-07-15 DIAGNOSIS — Z862 Personal history of diseases of the blood and blood-forming organs and certain disorders involving the immune mechanism: Secondary | ICD-10-CM | POA: Diagnosis not present

## 2014-07-15 DIAGNOSIS — M778 Other enthesopathies, not elsewhere classified: Secondary | ICD-10-CM | POA: Insufficient documentation

## 2014-07-15 DIAGNOSIS — J45909 Unspecified asthma, uncomplicated: Secondary | ICD-10-CM | POA: Insufficient documentation

## 2014-07-15 DIAGNOSIS — E876 Hypokalemia: Secondary | ICD-10-CM | POA: Diagnosis not present

## 2014-07-15 DIAGNOSIS — M25532 Pain in left wrist: Secondary | ICD-10-CM | POA: Diagnosis present

## 2014-07-15 MED ORDER — MELOXICAM 15 MG PO TABS: 15.0000 mg | ORAL_TABLET | Freq: Every day | ORAL | Status: DC

## 2014-07-15 MED ORDER — TRAMADOL HCL 50 MG PO TABS: 50.0000 mg | ORAL_TABLET | Freq: Four times a day (QID) | ORAL | Status: DC | PRN

## 2014-07-15 NOTE — Discharge Instructions (Signed)
Extensor Carpi Ulnaris Tendinitis with Rehab Tendonitis involves inflammation of a tendon, which is a soft tissue that connects muscle to bone. The Extensor Carpi Ulnaris (ECU) tendon is vulnerable to tendonitis. The ECU is responsible for straightening and rotating (abducting) the wrist. The ECU is important for gripping and pulling. ECU tendonitis may include inflammation of the ECU tendon lining (sheath). The tendon sheath usually secretes a fluid that allows the tendon to function smoothly, but when it becomes inflamed, function is impaired. This condition may also include a tear in the ECU tendon or muscle (strain). The strain may be classified as a grade 1 or 2 strain. Grade 1 strains cause pain, but the tendon is not lengthened. Grade 2 strains include a lengthened ligament, due to being stretched or partially ruptured. With grade 2 strains there is still function, although the function may be reduced. SYMPTOMS   Pain, tenderness, swelling, warmth, or redness on the little finger side of the wrist.  Pain that worsens when straightening the wrist or bending it toward the little finger.  Pain with gripping.  Limited motion of the wrist.  Crackling sound (crepitation) when the tendon or wrist is moved or touched. CAUSES  ECU tendonitis is caused by injury to the ECU tendon. It is usually due to chronic or repetitive injuries, but may be due to sharp (acute) injury. Common causes of injury include:  Strain from unusual use, overuse, or increase in activity, or change in activity of the wrist, hand, or forearm.  Direct hit (trauma) to the muscles and tendon on the side of the wrist.  Repetitive motions of the hand and wrist, due to friction of the tendon within the tendon lining. RISK INCREASES WITH:  Sports that involve repetitive hand and wrist motions (i.e. golfing, bowling).  Sports that require gripping (i.e. tennis, golf, weightlifting).  Heavy labor.  Poor wrist and forearm  strength and flexibility.  Failure to warm-up properly before activity. PREVENTION   Warm up and stretch properly before activity.  Allow the body to recover between activities.  Maintain physical fitness:  Strength, flexibility, and endurance.  Cardiovascular fitness.  Learn and use proper exercise technique. PROGNOSIS  If treated properly, ECU tendonitis is usually curable within 6 weeks.  RELATED COMPLICATIONS   Longer healing time, if not properly treated or if not given adequate time to heal.  Chronically inflamed tendon, causing persistent pain with activity. This may progress to constant pain, restriction of motion, and potentially tearing of the tendon.  Recurring symptoms, especially if activity is resumed too soon.  Risks of surgery: infection, bleeding, injury to nerves, continued pain, incomplete release of the tendon lining, recurring symptoms, cutting of the tendon, and weakness of the wrist and grip. TREATMENT  Treatment initially involves the use of ice and medicine, to help reduce pain and inflammation. Performing stretching and strengthening exercises regularly is important for a quick recovery. These exercises may be completed at home or with a therapist. Your caregiver may recommend the use of a brace or splint to reduce motions that aggravate symptoms. Corticosteroid injections may be recommended. If non-surgical treatment is unsuccessful, then surgery may be needed.  MEDICATION   If pain medicine is needed, nonsteroidal anti-inflammatory medicines (aspirin and ibuprofen), or other minor pain relievers (acetaminophen), are often recommended.  Do not take pain medicine for 7 days before surgery.  Prescription pain relievers may be given if your caregiver thinks they are needed. Use only as directed and only as much as you need.  Corticosteroid injections may be given to reduce inflammation. However, these injections should be reserved for serious cases, as  they may only be given a certain number of times. COLD THERAPY  Cold treatment (icing) relieves pain and reduces inflammation. Cold treatment should be applied for 10 to 15 minutes every 2 to 3 hours, and immediately after activity that aggravates your symptoms. Use ice packs or an ice massage. SEEK MEDICAL CARE IF:   Symptoms get worse or do not improve in 2 weeks, despite treatment.  You experience pain, numbness, or coldness in the hand.  Blue, gray, or dark color appears in the fingernails.  Any of the following occur after surgery: increased pain, swelling, redness, drainage of fluids, bleeding in the surgical area, or signs of infection.  New, unexplained symptoms develop. (Drugs used in treatment may produce side effects.) EXERCISES RANGE OF MOTION (ROM) AND STRETCHING EXERCISES - Extensor Carpi Ulnaris Tendinitis These are some of the initial exercises you may start your recovery program with, until you see your caregiver again or until your symptoms are resolved. Remember:  Flexible tissue is more tolerant of the stresses placed on it during activity.  Each stretch should be held for 20 to 30 seconds.  A gentle stretching sensation should be felt. RANGE OF MOTION - Wrist Flexion  Hold your right / left wrist with the fingers pointing down toward the floor.  Pull down on the wrist until you feel a stretch.  Hold this position for __________ seconds. Repeat exercise __________ times, __________ times per day.  This exercise should be done with the elbow: _____ Bent to 90 degrees. _____ Straight. RANGE OF MOTION - Wrist Flexion  Place the back of your right / left hand flat on the top of a table. Your shoulder should be turned in and your fingers facing away from your body.  Press down, bending your wrist and straightening your elbow until your feel a stretch.  Hold this position for __________ seconds.  Repeat exercise __________ times, __________ times per  day. STRENGTHENING EXERCISES - Extensor Carpi Ulnaris Tendinitis These are some of the initial exercises you may start your recovery program with, until you see your caregiver again or until your symptoms are resolved. Remember:  Strong muscles with good endurance tolerate stress better.  Do the exercises as initially prescribed by your caregiver. Progress slowly with each exercise, gradually increasing the number of repetitions and weight used, only as guided. RANGE OF MOTION - Wrist Extensors  Sit or stand with your forearm supported.  Using a __________ weight or a piece of rubber band or tubing, bend your wrist slowly upward toward you.  Hold this position for __________ seconds and then slowly lower the wrist back to the starting position.  Repeat exercise __________ times, __________ times per day. RANGE OF MOTION - Wrist, Ulnar Deviation  Stand with a hammer in your hand, or sit while holding onto a rubber band or tubing with both hands, and with your arm supported on a table.  Raise your hand with the hammer upward behind you, or pull down on the rubber tubing.  Hold this position for __________ seconds and then slowly lower the wrist back to the starting position.  Repeat exercise __________ times, __________ times per day. STRENGTH - Grip  Hold a wad of putty, soft modeling clay, a large sponge, a soft rubber ball or a soft tennis ball in your hand.  Squeeze as hard as you can.  Hold this position for  __________ seconds.  Repeat exercise __________ times, __________ times per day. Document Released: 07/29/2005 Document Revised: 10/21/2011 Document Reviewed: 11/10/2008 [facility] Patient Information 2015 [place] Lakes[place]oreExitCare, [place]LL[place]. This information is not intended to replace advice given to you by your health care provider. Make sure you discuss any questions you have with your health care provider.

## 2014-07-15 NOTE — ED Provider Notes (Signed)
CSN: [id number]     Arrival date & time 07/15/14  1056 History  This chart was scribed for Arthor CaptainAbigail Remmy Crass, PA-C, working with Doug SouSam Jacubowitz, MD by Chestine SporeSoijett Blue, ED Scribe. The patient was seen in room TR08C/TR08C at 11:30 AM.    Chief Complaint  Patient presents with  . Wrist Pain    The history is provided by the patient. No language interpreter was used.   HPI Comments: Alexis Marquez is a 43 y.o. female who presents to the Emergency Department complaining of left wrist pain onset 5 days. She denies injury to the wrist. 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 Pain is worsened by movement. She states that she is having associated symptoms of joint swelling. She states that she has tried Ibuprofen with mild relief for her symptoms. She denies warmth and any other symptoms.  Past Medical History  Diagnosis Date  . Hypertension   . High cholesterol   . Asthma   . DM (diabetes mellitus)     borderline  . Low blood potassium   . Iron deficiency anemia   . Allergic rhinitis    Past Surgical History  Procedure Laterality Date  . Cesarean section      x 4  . Umbilical hernia repair  1977  . Tubal ligation  1996   Family History  Problem Relation Age of Onset  . Breast cancer Maternal Aunt   . Diabetes Mother   . Diabetes Maternal Aunt   . Heart disease Maternal Grandmother     great grandmother  . Stroke Mother   . CAD Neg Hx    History  Substance Use Topics  . Smoking status: Never Smoker   . Smokeless tobacco: Never Used  . Alcohol Use: No   OB History    No data available     Review of Systems  Musculoskeletal: Positive for joint swelling and arthralgias.  Neurological: Negative for numbness.       No tingling      Allergies  Review of patient's allergies indicates no known allergies.  Home Medications   Prior to Admission medications   Medication Sig Start Date End Date Taking? Authorizing Provider  benazepril-hydrochlorthiazide (LOTENSIN HCT)  20-25 MG per tablet Take 1 tablet by mouth daily.    Historical Provider, MD  cholecalciferol (VITAMIN D) 1000 UNITS tablet Take 1,000 Units by mouth daily.    Historical Provider, MD  levalbuterol [facility](XOPENEX HFA) 45 MCG/ACT inhaler Inhale 2 puffs into the lungs every 4 (four) hours as needed for wheezing. For shortness of breath 09/07/13   Lucky CowboyWilliam McKeown, MD  metFORMIN (GLUCOPHAGE-XR) 500 MG 24 hr tablet Take 500 mg by mouth daily.  09/07/13 09/07/14  Lucky CowboyWilliam McKeown, MD  potassium chloride SA (K-DUR,KLOR-CON) 20 MEQ tablet Take 1 tablet (20 mEq total) by mouth daily. 03/19/14   Quentin MullingAmanda Collier, PA-C  pravastatin (PRAVACHOL) 40 MG tablet TAKE ONE TABLET BY MOUTH ONCE DAILY AT BEDTIME FOR CHOLESTEROL 04/14/14   Lucky CowboyWilliam McKeown, MD   BP 136/83 mmHg  Pulse 72  Temp(Src) 98.2 F (36.8 C) (Oral)  Resp 17  SpO2 97%  LMP 07/08/2014  Physical Exam  Constitutional: She is oriented to person, place, and time. She appears well-developed and well-nourished. No distress.  HENT:  Head: Normocephalic and atraumatic.  Eyes: EOM are normal.  Neck: Neck supple. No tracheal deviation present.  Cardiovascular: Normal rate.   Pulmonary/Chest: Effort normal. No respiratory distress.  Musculoskeletal: Normal range of motion.  Right wrist: Normal.       Left wrist: She exhibits tenderness and swelling. She exhibits normal range of motion and no crepitus.  Swelling over the proximal dorsal surface of the forearm. TTP at the ulnar side of the wrist on the dorsum over the tendon. No crepitus, no pitting. Full ROM and strength. No edema. Pain with ulnar deviation of the wrist and extension.   Neurological: She is alert and oriented to person, place, and time.  Skin: Skin is warm and dry.  Psychiatric: She has a normal mood and affect. Her behavior is normal.  Nursing note and vitals reviewed.   ED Course  Procedures (including critical care time) DIAGNOSTIC STUDIES: Oxygen Saturation is 97% on room air, normal  by my interpretation.    COORDINATION OF CARE: 11:34 AM-Discussed treatment plan which includes RICE, anti-inflammatory, F/U with orthopedic with pt at bedside and pt agreed to plan.   Labs Review Labs Reviewed - No data to display  Imaging Review No results found.   EKG Interpretation None      MDM   Final diagnoses:  Left wrist tendinitis    Patient X-Ray negative for obvious fracture or dislocation. Pain managed in ED. Pt advised to follow up with orthopedics if symptoms persist for possibility of missed fracture diagnosis. Patient given brace while in ED, conservative therapy recommended and discussed. Patient will be dc home & is agreeable with above plan.   I personally performed the services described in this documentation, which was scribed in my presence. The recorded information has been reviewed and is accurate.    Arthor Captainbigail Jaycen Vercher, PA-C 07/16/14 1221  Doug SouSam Jacubowitz, MD 07/16/14 [phone]

## 2014-07-15 NOTE — ED Notes (Signed)
Pt reports pain to left wrist since Monday with mild swelling, denies injury to wrist.

## 2014-07-31 ENCOUNTER — Other Ambulatory Visit: Payer: Self-pay | Admitting: Internal Medicine

## 2014-08-09 ENCOUNTER — Ambulatory Visit: Payer: Self-pay | Admitting: Physician Assistant

## 2014-08-19 ENCOUNTER — Encounter: Payer: Self-pay | Admitting: Physician Assistant

## 2014-08-19 ENCOUNTER — Ambulatory Visit (INDEPENDENT_AMBULATORY_CARE_PROVIDER_SITE_OTHER): Payer: BLUE CROSS/BLUE SHIELD | Admitting: Physician Assistant

## 2014-08-19 VITALS — BP 120/88 | HR 68 | Temp 98.4°F | Resp 16 | Ht 60.0 in | Wt 198.4 lb

## 2014-08-19 DIAGNOSIS — E876 Hypokalemia: Secondary | ICD-10-CM | POA: Insufficient documentation

## 2014-08-19 DIAGNOSIS — E559 Vitamin D deficiency, unspecified: Secondary | ICD-10-CM

## 2014-08-19 DIAGNOSIS — E669 Obesity, unspecified: Secondary | ICD-10-CM

## 2014-08-19 DIAGNOSIS — R7309 Other abnormal glucose: Secondary | ICD-10-CM

## 2014-08-19 DIAGNOSIS — R7303 Prediabetes: Secondary | ICD-10-CM

## 2014-08-19 DIAGNOSIS — E782 Mixed hyperlipidemia: Secondary | ICD-10-CM

## 2014-08-19 DIAGNOSIS — I10 Essential (primary) hypertension: Secondary | ICD-10-CM

## 2014-08-19 DIAGNOSIS — Z79899 Other long term (current) drug therapy: Secondary | ICD-10-CM

## 2014-08-19 LAB — CBC WITH DIFFERENTIAL/PLATELET
Basophils Absolute: 0 10*3/uL (ref 0.0–0.1)
Basophils Relative: 0 % (ref 0–1)
EOS ABS: 0.2 10*3/uL (ref 0.0–0.7)
EOS PCT: 3 % (ref 0–5)
HCT: 34.9 % — ABNORMAL LOW (ref 36.0–46.0)
HEMOGLOBIN: 11.5 g/dL — AB (ref 12.0–15.0)
LYMPHS ABS: 2.7 10*3/uL (ref 0.7–4.0)
Lymphocytes Relative: 50 % — ABNORMAL HIGH (ref 12–46)
MCH: 26.2 pg (ref 26.0–34.0)
MCHC: 33 g/dL (ref 30.0–36.0)
MCV: 79.5 fL (ref 78.0–100.0)
MONOS PCT: 8 % (ref 3–12)
MPV: 9.5 fL (ref 8.6–12.4)
Monocytes Absolute: 0.4 10*3/uL (ref 0.1–1.0)
NEUTROS ABS: 2.1 10*3/uL (ref 1.7–7.7)
Neutrophils Relative %: 39 % — ABNORMAL LOW (ref 43–77)
Platelets: 318 10*3/uL (ref 150–400)
RBC: 4.39 MIL/uL (ref 3.87–5.11)
RDW: 13.8 % (ref 11.5–15.5)
WBC: 5.4 10*3/uL (ref 4.0–10.5)

## 2014-08-19 LAB — HEMOGLOBIN A1C
Hgb A1c MFr Bld: 5.9 % — ABNORMAL HIGH (ref <5.7)
Mean Plasma Glucose: 123 mg/dL — ABNORMAL HIGH (ref <117)

## 2014-08-19 MED ORDER — POTASSIUM CHLORIDE CRYS ER 20 MEQ PO TBCR: 20.0000 meq | EXTENDED_RELEASE_TABLET | Freq: Every day | ORAL | Status: DC

## 2014-08-19 NOTE — Patient Instructions (Addendum)
Bad carbs also include fruit juice, alcohol, and sweet tea. These are empty calories that do not signal to your brain that you are full.   Please remember the good carbs are still carbs which convert into sugar. So please measure them out no more than 1/2-1 cup of rice, oatmeal, pasta, and beans  Veggies are however free foods! Pile them on.   Not all fruit is created equal. Please see the list below, the fruit at the bottom is higher in sugars than the fruit at the top. Please avoid all dried fruits.     Sleep Apnea  Sleep apnea is a sleep disorder characterized by abnormal pauses in breathing while you sleep. When your breathing pauses, the level of oxygen in your blood decreases. This causes you to move out of deep sleep and into light sleep. As a result, your quality of sleep is poor, and the system that carries your blood throughout your body (cardiovascular system) experiences stress. If sleep apnea remains untreated, the following conditions can develop:  High blood pressure (hypertension).  Coronary artery disease.  Inability to achieve or maintain an erection (impotence).  Impairment of your thought process (cognitive dysfunction). There are three types of sleep apnea:  Obstructive sleep apnea--Pauses in breathing during sleep because of a blocked airway.  Central sleep apnea--Pauses in breathing during sleep because the area of the brain that controls your breathing does not send the correct signals to the muscles that control breathing.  Mixed sleep apnea--A combination of both obstructive and central sleep apnea. RISK FACTORS The following risk factors can increase your risk of developing sleep apnea:  Being overweight.  Smoking.  Having narrow passages in your nose and throat.  Being of older age.  Being female.  Alcohol use.  Sedative and tranquilizer use.  Ethnicity. Among individuals younger than 35 years, African Americans are at increased risk of sleep  apnea. SYMPTOMS   Difficulty staying asleep.  Daytime sleepiness and fatigue.  Loss of energy.  Irritability.  Loud, heavy snoring.  Morning headaches.  Trouble concentrating.  Forgetfulness.  Decreased interest in sex. DIAGNOSIS  In order to diagnose sleep apnea, your caregiver will perform a physical examination. Your caregiver may suggest that you take a home sleep test. Your caregiver may also recommend that you spend the night in a sleep lab. In the sleep lab, several monitors record information about your heart, lungs, and brain while you sleep. Your leg and arm movements and blood oxygen level are also recorded. TREATMENT The following actions may help to resolve mild sleep apnea:  Sleeping on your side.   Using a decongestant if you have nasal congestion.   Avoiding the use of depressants, including alcohol, sedatives, and narcotics.   Losing weight and modifying your diet if you are overweight. There also are devices and treatments to help open your airway:  Oral appliances. These are custom-made mouthpieces that shift your lower jaw forward and slightly open your bite. This opens your airway.  Devices that create positive airway pressure. This positive pressure "splints" your airway open to help you breathe better during sleep. The following devices create positive airway pressure:  Continuous positive airway pressure (CPAP) device. The CPAP device creates a continuous level of air pressure with an air pump. The air is delivered to your airway through a mask while you sleep. This continuous pressure keeps your airway open.  Nasal expiratory positive airway pressure (EPAP) device. The EPAP device creates positive air pressure as  you exhale. The device consists of single-use valves, which are inserted into each nostril and held in place by adhesive. The valves create very little resistance when you inhale but create much more resistance when you exhale. That  increased resistance creates the positive airway pressure. This positive pressure while you exhale keeps your airway open, making it easier to breath when you inhale again.  Bilevel positive airway pressure (BPAP) device. The BPAP device is used mainly in patients with central sleep apnea. This device is similar to the CPAP device because it also uses an air pump to deliver continuous air pressure through a mask. However, with the BPAP machine, the pressure is set at two different levels. The pressure when you exhale is lower than the pressure when you inhale.  Surgery. Typically, surgery is only done if you cannot comply with less invasive treatments or if the less invasive treatments do not improve your condition. Surgery involves removing excess tissue in your airway to create a wider passage way. Document Released: 07/19/2002 Document Revised: 11/23/2012 Document Reviewed: 12/05/2011 [facility]Exit[facility] Patient Information 2015 [place]ExitCare, [place]LL[place]. This information is not intended to replace advice given to you by your health care provider. Make sure you discuss any questions you have with your health care provider.  Magnesium low add 250 mg with food to prevent diarrhea. Magnesium may help with muscle cramps, constipation, vitamin D and potassium absorption.   Potassium Content of Foods  The body needs potassium to control blood pressure and to keep the muscles and nervous system healthy. Here are some healthy foods below that are high in potassium. Also you can get the white label salt of "NO SALT" salt substitute, 1/4 teaspoon of this is equivalent to 20meq potassium.   FOODS AND DRINKS HIGH IN POTASSIUM FOODS MODERATE IN POTASSIUM   Fruits  Avocado (cubed),  c / 50 g.  Banana (sliced), 75 g.  Cantaloupe (cubed), 80 g.  Honeydew, 1 wedge / 85 g.  Kiwi (sliced), 90 g.  Nectarine, 1 small / 129 g.  Orange, 1 medium / 131 g. Vegetables  Artichoke,  of a medium / 64 g.  Asparagus (boiled), 90  g..  Broccoli (boiled), 78 g.  Brussels sprout (boiled), 78 g.  Butternut squash (baked), 103 g.  Chickpea (cooked), 82 g.  Green peas (cooked), 80 g.  Kidney beans (cooked), 5 tbsp / 55 g.  Lima beans (cooked),  c / 43 g.  Navy beans (cooked),  c / 61 g.  Spinach (cooked),  c / 45 g.  Sweet potato (baked),  c / 50 g.  Tomato (chopped or sliced), 90 g.  Vegetable juice.  White mushrooms (cooked), 78 g.  Yam (cooked or baked),  c / 34 g.  Zucchini squash (boiled), 90 g. Other Foods and Drinks  Almonds (whole),  c / 36 g.  Fish, 3 oz / 85 g.  Nonfat fruit variety yogurt, 123 g.  Pistachio nuts, 1 oz / 28 g.  Pumpkin seeds, 1 oz / 28 g.  Red meat (broiled, cooked, grilled), 3 oz / 85 g.  Scallops (steamed), 3 oz / 85 g.  Spaghetti sauce,  c / 66 g.  Sunflower seeds (dry roasted), 1 oz / 28 g.  Veggie burger, 1 patty / 70 g. Fruits  Grapefruit,  of the fruit / 123 g  Plums (sliced), 83 g.  Tangerine, 1 large / 120 g. Vegetables  Carrots (boiled), 78 g.  Carrots (sliced), 61 g.  Rhubarb (cooked with  sugar), 120 g.  Rutabaga (cooked), 120 g.  Yellow snap beans (cooked), 63 g. Other Foods and Drinks   Chicken breast (roasted and chopped),  c / 70 g.  Pita bread, 1 large / 64 g.  Shrimp (steamed), 4 oz / 113 g.  Swiss cheese (diced), 70 g.

## 2014-08-19 NOTE — Progress Notes (Signed)
Assessment and Plan:  Hypertension: Continue medication, monitor blood pressure at home. Continue DASH diet.  Reminder to go to the ER if any CP, SOB, nausea, dizziness, severe HA, changes vision/speech, left arm numbness and tingling and jaw pain. Cholesterol: Continue diet and exercise. Check cholesterol.  Prediabetes with insuling resistance on metformin -Continue diet and exercise. Check A1C Vitamin D Def- check level and continue medications.  Obesity with co morbidities- long discussion about weight loss, diet, and exercise Hypokalemia due to HCTZ- will send in potassium, get on Mag too.   Continue diet and meds as discussed. Further disposition pending results of labs. Discussed med's effects and SE's.   HPI 44 y.o. female  presents for 3 month follow up with hypertension, hyperlipidemia, diabetes and vitamin D. Her blood pressure has been controlled at home, today their BP is BP: 120/88 mmHg  Potassium was low last visit at 3.3, she is on HCTZ , states she needs HCTZ due to fluid retention, has been out of potassium pill for 3 months.  Lab Results  Component Value Date   CREATININE 0.63 03/18/2014   BUN 8 03/18/2014   NA 140 03/18/2014   K 3.3* 03/18/2014   CL 99 03/18/2014   CO2 30 03/18/2014   She does not workout. She denies chest pain, shortness of breath, dizziness.  She is on cholesterol medication, pravastatin  and denies myalgias. Her cholesterol is at goal. The cholesterol was:  03/18/2014: Cholesterol, Total 166; HDL Cholesterol by NMR 46; LDL (calc) 106*; Triglycerides 72 She has been working on diet and exercise for Prediabetes with insulin resistence, she is on ACE/ARB, and denies paresthesia of the feet, polydipsia, polyuria and visual disturbances. Had eye exam with Dr. Laural Benes that was normal.  Last A1C was: 03/18/2014: Hemoglobin-A1c 5.6  Patient is on Vitamin D supplement, 2000 IU daily. 03/18/2014: Vit D, 25-Hydroxy 32  BMI is Body mass index is 38.75  kg/(m^2)., she is working on diet and exercise. Wt Readings from Last 3 Encounters:  08/19/14 198 lb 6.4 oz (89.994 kg)  03/18/14 200 lb (90.719 kg)  03/08/14 198 lb (89.812 kg)     Current Medications:  Current Outpatient Prescriptions on File Prior to Visit  Medication Sig Dispense Refill  . benazepril-hydrochlorthiazide (LOTENSIN HCT) 20-25 MG per tablet Take 1 tablet by mouth daily.    . cholecalciferol (VITAMIN D) 1000 UNITS tablet Take 1,000 Units by mouth daily.    Marland Kitchen levalbuterol (XOPENEX HFA) 45 MCG/ACT inhaler Inhale 2 puffs into the lungs every 4 (four) hours as needed for wheezing. For shortness of breath 1 Inhaler 99  . metFORMIN (GLUCOPHAGE-XR) 500 MG 24 hr tablet Take 500 mg by mouth daily.     . potassium chloride SA (K-DUR,KLOR-CON) 20 MEQ tablet Take 1 tablet (20 mEq total) by mouth daily. 30 tablet 4  . pravastatin (PRAVACHOL) 40 MG tablet TAKE ONE TABLET BY MOUTH ONCE DAILY AT BEDTIME FOR CHOLESTEROL 90 tablet 0  . meloxicam (MOBIC) 15 MG tablet Take 1 tablet (15 mg total) by mouth daily. Take 1 daily with food. (Patient not taking: Reported on 08/19/2014) 10 tablet 0  . traMADol (ULTRAM) 50 MG tablet Take 1 tablet (50 mg total) by mouth every 6 (six) hours as needed. (Patient not taking: Reported on 08/19/2014) 15 tablet 0   No current facility-administered medications on file prior to visit.   Medical History:  Past Medical History  Diagnosis Date  . Hypertension   . High cholesterol   . Asthma   .  DM (diabetes mellitus)     borderline  . Low blood potassium   . Iron deficiency anemia   . Allergic rhinitis    Allergies: No Known Allergies   Review of Systems:  Review of Systems  Constitutional: Positive for malaise/fatigue. Negative for fever, chills, weight loss and diaphoresis.  HENT: Negative.   Eyes: Negative.   Respiratory: Negative.   Cardiovascular: Negative.   Gastrointestinal: Negative.   Genitourinary: Negative.   Musculoskeletal: Positive for  myalgias. Negative for back pain, joint pain, falls and neck pain.  Skin: Negative.   Neurological: Negative.  Negative for weakness.  Psychiatric/Behavioral: Negative.     Family history- Review and unchanged Social history- Review and unchanged Physical Exam: BP 120/88 mmHg  Pulse 68  Temp(Src) 98.4 F (36.9 C)  Resp 16  Ht 5' (1.524 m)  Wt 198 lb 6.4 oz (89.994 kg)  BMI 38.75 kg/m2  LMP 08/03/2014 Wt Readings from Last 3 Encounters:  08/19/14 198 lb 6.4 oz (89.994 kg)  03/18/14 200 lb (90.719 kg)  03/08/14 198 lb (89.812 kg)   General Appearance: Well nourished, in no apparent distress. Eyes: PERRLA, EOMs, conjunctiva no swelling or erythema Sinuses: No Frontal/maxillary tenderness ENT/Mouth: Ext aud canals clear, TMs without erythema, bulging. No erythema, swelling, or exudate on post pharynx.  Tonsils not swollen or erythematous. Hearing normal.  Neck: Supple, thyroid normal.  Respiratory: Respiratory effort normal, BS equal bilaterally without rales, rhonchi, wheezing or stridor.  Cardio: RRR with no MRGs. Brisk peripheral pulses without edema.  Abdomen: Soft, + BS.  Non tender, no guarding, rebound, hernias, masses. Lymphatics: Non tender without lymphadenopathy.  Musculoskeletal: Full ROM, 5/5 strength, normal gait.  Skin: Warm, dry without rashes, lesions, ecchymosis.  Neuro: Cranial nerves intact. No cerebellar symptoms. Sensation intact.  Psych: Awake and oriented X 3, normal affect, Insight and Judgment appropriate.    Quentin Mullingollier, Amazin Pincock, PA-C 10:15 AM [facility]

## 2014-08-20 LAB — LIPID PANEL
CHOL/HDL RATIO: 3.5 ratio
Cholesterol: 156 mg/dL (ref 0–200)
HDL: 44 mg/dL (ref >39)
LDL Cholesterol: 94 mg/dL (ref 0–99)
Triglycerides: 88 mg/dL (ref <150)
VLDL: 18 mg/dL (ref 0–40)

## 2014-08-20 LAB — BASIC METABOLIC PANEL WITH GFR
BUN: 9 mg/dL (ref 6–23)
CALCIUM: 9.8 mg/dL (ref 8.4–10.5)
CO2: 30 mEq/L (ref 19–32)
CREATININE: 0.57 mg/dL (ref 0.50–1.10)
Chloride: 100 mEq/L (ref 96–112)
GFR, Est Non African American: >89 mL/min
Glucose, Bld: 98 mg/dL (ref 70–99)
Potassium: 3.4 mEq/L — ABNORMAL LOW (ref 3.5–5.3)
Sodium: 139 mEq/L (ref 135–145)

## 2014-08-20 LAB — TSH: TSH: 0.494 u[IU]/mL (ref 0.350–4.500)

## 2014-08-20 LAB — VITAMIN D 25 HYDROXY (VIT D DEFICIENCY, FRACTURES): Vit D, 25-Hydroxy: 28 ng/mL — ABNORMAL LOW (ref 30–100)

## 2014-08-20 LAB — HEPATIC FUNCTION PANEL
ALBUMIN: 4.2 g/dL (ref 3.5–5.2)
ALT: 32 U/L (ref 0–35)
AST: 21 U/L (ref 0–37)
Alkaline Phosphatase: 48 U/L (ref 39–117)
Bilirubin, Direct: 0.1 mg/dL (ref 0.0–0.3)
Indirect Bilirubin: 0.3 mg/dL (ref 0.2–1.2)
Total Bilirubin: 0.4 mg/dL (ref 0.2–1.2)
Total Protein: 7.5 g/dL (ref 6.0–8.3)

## 2014-08-20 LAB — INSULIN, FASTING: INSULIN FASTING, SERUM: 43.7 u[IU]/mL — AB (ref 2.0–19.6)

## 2014-08-20 LAB — MAGNESIUM: Magnesium: 1.8 mg/dL (ref 1.5–2.5)

## 2014-09-08 ENCOUNTER — Encounter: Payer: Self-pay | Admitting: Physician Assistant

## 2014-09-08 ENCOUNTER — Ambulatory Visit (INDEPENDENT_AMBULATORY_CARE_PROVIDER_SITE_OTHER): Payer: BLUE CROSS/BLUE SHIELD | Admitting: Physician Assistant

## 2014-09-08 VITALS — BP 142/86 | HR 84 | Temp 99.8°F | Resp 16 | Ht 60.0 in | Wt 211.0 lb

## 2014-09-08 DIAGNOSIS — J209 Acute bronchitis, unspecified: Secondary | ICD-10-CM

## 2014-09-08 MED ORDER — BENZONATATE 100 MG PO CAPS: 200.0000 mg | ORAL_CAPSULE | Freq: Three times a day (TID) | ORAL | Status: DC | PRN

## 2014-09-08 MED ORDER — AZITHROMYCIN 250 MG PO TABS: ORAL_TABLET | ORAL | Status: AC

## 2014-09-08 MED ORDER — PREDNISONE 10 MG PO TABS: ORAL_TABLET | ORAL | Status: AC

## 2014-09-08 NOTE — Addendum Note (Signed)
Addended by: Bertram SavinOUILLARD, Ruthanna Macchia L on: 09/08/2014 08:20 PM   Modules accepted: Kipp BroodSmartSet

## 2014-09-08 NOTE — Progress Notes (Addendum)
HPI  An African American 44 y.o.female presents to the office today due to cold sxs that started 2 weeks ago and have gotten worse in the past 3 days.  No sick contacts.  Never smoked.  Has tried Catering managerAlka Seltzer and Nyquil with no relief.  She reports fever, nasal congestion, rhinorrhea, sore throat, voice change, non-productive cough and wheezing.  She denies chills, sweats, fatigue, ear congestion/pain, trouble swallowing, SOB, CP, abdominal pain, nausea, vomiting, diarrhea, constipation, headaches, dizziness and lightheadedness.  Review of Systems  All other systems reviewed and are negative.  Past Medical History-  Past Medical History  Diagnosis Date  . Hypertension   . High cholesterol   . Asthma   . DM (diabetes mellitus)     borderline  . Low blood potassium   . Iron deficiency anemia   . Allergic rhinitis    Medications-  Current Outpatient Prescriptions on File Prior to Visit  Medication Sig Dispense Refill  . benazepril-hydrochlorthiazide (LOTENSIN HCT) 20-25 MG per tablet Take 1 tablet by mouth daily.    . cholecalciferol (VITAMIN D) 1000 UNITS tablet Take 1,000 Units by mouth daily.    Marland Kitchen. levalbuterol (XOPENEX HFA) 45 MCG/ACT inhaler Inhale 2 puffs into the lungs every 4 (four) hours as needed for wheezing. For shortness of breath 1 Inhaler 99  . meloxicam (MOBIC) 15 MG tablet Take 1 tablet (15 mg total) by mouth daily. Take 1 daily with food. 10 tablet 0  . potassium chloride SA (K-DUR,KLOR-CON) 20 MEQ tablet Take 1 tablet (20 mEq total) by mouth daily. 30 tablet 4  . pravastatin (PRAVACHOL) 40 MG tablet TAKE ONE TABLET BY MOUTH ONCE DAILY AT BEDTIME FOR CHOLESTEROL 90 tablet 0  . traMADol (ULTRAM) 50 MG tablet Take 1 tablet (50 mg total) by mouth every 6 (six) hours as needed. 15 tablet 0   No current facility-administered medications on file prior to visit.   Allergies- No Known Allergies  Physical Exam BP 142/86 mmHg  Pulse 84  Temp(Src) 99.8 F (37.7 C)  (Temporal)  Resp 16  Ht 5' (1.524 m)  Wt 211 lb (95.709 kg)  BMI 41.21 kg/m2  SpO2 98%  LMP 09/08/2014 Wt Readings from Last 3 Encounters:  09/08/14 211 lb (95.709 kg)  08/19/14 198 lb 6.4 oz (89.994 kg)  03/18/14 200 lb (90.719 kg)  Vitals Reviewed. General Appearance: Well nourished, in no apparent distress and had pleasant demeanor. Obese. Sickly appearance.  Eyes:  PERRLA. EOMI. Conjunctiva is pink without edema, erythema or yellowing. No scleral icterus. Sinuses: No Frontal/maxillary tenderness Ears: No erythema, edema or tenderness on both external ear cartilages and ear canals.  TMs are intact bilaterally with normal light reflexes and without erythema, edema or bulging. Nose: Nose is symmetrical and turbinates are erythematous and non-edematous bilaterally.  No polyps or paleness.  Rhinorrhea present. Throat: Oral pharynx is pink and moist. Erythematous and non-edematous posterior pharynx.  Mucosa is intact and without lesions.  Tonsils are at +1 station bilaterally and do not have exudate.    Uvula is midline and not swollen. Neck: Supple,  LAD, trachea is midline.  Full range of motion in neck intact Respiratory: Wheezing in all lung fields.  No rales, rhonchi or stridor.  No increased effort of breathing. Cardio: RRR.   m/r/g.  S1S2nl.   Extremities:  C/C/E in upper and lower extremities. Pulses B/L +2  Skin: Warm, dry, intact without rashes, lesions, ecchymosis, yellowing, cyanosis.  Neuro: Alert and oriented X3, cooperative.  Mood and  affect appropriate to situation.  CN II-XII grossly intact.   Psych: Insight and Judgment appropriate.   Assessment and Plan 1. Acute bronchitis, unspecified organism - azithromycin (ZITHROMAX) 250 MG tablet; Take 2 tablets PO on day 1, then 1 tablet PO Q24H x 4 days  Dispense: 6 tablet; Refill: 1 - predniSONE (DELTASONE) 10 MG tablet; Take 3 tablets PO for 2 days, then take 2 tablets PO for 2 days, then take 1 tablet PO for 3 days.   Dispense: 13 tablet; Refill: 0 - benzonatate (TESSALON PERLES) 100 MG capsule; Take 2 capsules (200 mg total) by mouth 3 (three) times daily as needed for cough (Max:  per day (6 capsules per day)).  Dispense: 120 capsule; Refill: 0 -Gave patient Mucinex (without DM) samples- follow directions on container. -Tylenol OTC for fever- follow directions on container.  Discussed medication effects and SE's.  Pt agreed to treatment plan. If you are not feeling better in 10-14 days, then please call the office. Please keep your follow up appt on 12/02/14.  Jeena Arnett, Lise Auer, PA-C 2:35 PM [facility]

## 2014-09-08 NOTE — Patient Instructions (Signed)
-Take Z-Pak as prescribed. Take Prednisone as prescribed for inflammation -Take Tessalon Perles as prescribed for cough. Take Mucinex (Without DM)- follow direction on container.  Please take Tylenol over the counter for fever- follow directions on bottle. Please make sure you are drinking water to stay hydrated.  If you are not feeling better in 10-14 days, then please call the office.   Acute Bronchitis Bronchitis is inflammation of the airways that extend from the windpipe into the lungs (bronchi). The inflammation often causes mucus to develop. This leads to a cough, which is the most common symptom of bronchitis.  In acute bronchitis, the condition usually develops suddenly and goes away over time, usually in a couple weeks. Smoking, allergies, and asthma can make bronchitis worse. Repeated episodes of bronchitis may cause further lung problems.  CAUSES Acute bronchitis is most often caused by the same virus that causes a cold. The virus can spread from person to person (contagious) through coughing, sneezing, and touching contaminated objects. SIGNS AND SYMPTOMS   Cough.   Fever.   Coughing up mucus.   Body aches.   Chest congestion.   Chills.   Shortness of breath.   Sore throat.  DIAGNOSIS  Acute bronchitis is usually diagnosed through a physical exam. Your health care provider will also ask you questions about your medical history. Tests, such as chest X-rays, are sometimes done to rule out other conditions.  TREATMENT  Acute bronchitis usually goes away in a couple weeks. Oftentimes, no medical treatment is necessary. Medicines are sometimes given for relief of fever or cough. Antibiotic medicines are usually not needed but may be prescribed in certain situations. In some cases, an inhaler may be recommended to help reduce shortness of breath and control the cough. A cool mist vaporizer may also be used to help thin bronchial secretions and make it easier to  clear the chest.  HOME CARE INSTRUCTIONS  Get plenty of rest.   Drink enough fluids to keep your urine clear or pale yellow (unless you have a medical condition that requires fluid restriction). Increasing fluids may help thin your respiratory secretions (sputum) and reduce chest congestion, and it will prevent dehydration.   Take medicines only as directed by your health care provider.  If you were prescribed an antibiotic medicine, finish it all even if you start to feel better.  Avoid smoking and secondhand smoke. Exposure to cigarette smoke or irritating chemicals will make bronchitis worse. If you are a smoker, consider using nicotine gum or skin patches to help control withdrawal symptoms. Quitting smoking will help your lungs heal faster.   Reduce the chances of another bout of acute bronchitis by washing your hands frequently, avoiding people with cold symptoms, and trying not to touch your hands to your mouth, nose, or eyes.   Keep all follow-up visits as directed by your health care provider.  SEEK MEDICAL CARE IF: Your symptoms do not improve after 1 week of treatment.  SEEK IMMEDIATE MEDICAL CARE IF:  You develop an increased fever or chills.   You have chest pain.   You have severe shortness of breath.  You have bloody sputum.   You develop dehydration.  You faint or repeatedly feel like you are going to pass out.  You develop repeated vomiting.  You develop a severe headache. MAKE SURE YOU:   Understand these instructions.  Will watch your condition.  Will get help right away if you are not doing well or get worse. Document Released: 09/05/2004  Document Revised: 12/13/2013 Document Reviewed: 01/19/2013 [facility] Patient Information 2015 [employer] [place]. This information is not intended to replace advice given to you by your health care provider. Make sure you discuss any questions you have with your health care provider.

## 2014-10-10 ENCOUNTER — Other Ambulatory Visit: Payer: Self-pay

## 2014-10-10 ENCOUNTER — Other Ambulatory Visit: Payer: Self-pay | Admitting: Internal Medicine

## 2014-10-10 DIAGNOSIS — E782 Mixed hyperlipidemia: Secondary | ICD-10-CM

## 2014-10-10 DIAGNOSIS — E876 Hypokalemia: Secondary | ICD-10-CM

## 2014-10-10 DIAGNOSIS — I10 Essential (primary) hypertension: Secondary | ICD-10-CM

## 2014-10-10 MED ORDER — PRAVASTATIN SODIUM 40 MG PO TABS: ORAL_TABLET | ORAL | Status: DC

## 2014-10-10 MED ORDER — METFORMIN HCL ER 500 MG PO TB24: 500.0000 mg | ORAL_TABLET | Freq: Every day | ORAL | Status: DC

## 2014-10-10 MED ORDER — BENAZEPRIL-HYDROCHLOROTHIAZIDE 20-25 MG PO TABS: ORAL_TABLET | ORAL | Status: DC

## 2014-10-10 MED ORDER — POTASSIUM CHLORIDE CRYS ER 20 MEQ PO TBCR: 20.0000 meq | EXTENDED_RELEASE_TABLET | Freq: Every day | ORAL | Status: DC

## 2014-10-10 MED ORDER — POTASSIUM CHLORIDE CRYS ER 20 MEQ PO TBCR: 20.0000 meq | EXTENDED_RELEASE_TABLET | Freq: Every day | ORAL | Status: AC

## 2014-10-10 MED ORDER — BENAZEPRIL-HYDROCHLOROTHIAZIDE 20-25 MG PO TABS: 1.0000 | ORAL_TABLET | Freq: Every day | ORAL | Status: DC

## 2014-10-10 NOTE — Telephone Encounter (Signed)
Metformin was previously taken off med list, verified with patient today that she is still taking Metformin XR 500 one daily, RX sent to [facility]

## 2014-12-02 ENCOUNTER — Encounter: Payer: Self-pay | Admitting: Physician Assistant

## 2014-12-02 ENCOUNTER — Ambulatory Visit (INDEPENDENT_AMBULATORY_CARE_PROVIDER_SITE_OTHER): Payer: BLUE CROSS/BLUE SHIELD | Admitting: Physician Assistant

## 2014-12-02 VITALS — BP 128/88 | HR 60 | Temp 97.8°F | Resp 16 | Ht 60.0 in | Wt 207.0 lb

## 2014-12-02 DIAGNOSIS — R7309 Other abnormal glucose: Secondary | ICD-10-CM

## 2014-12-02 DIAGNOSIS — Z79899 Other long term (current) drug therapy: Secondary | ICD-10-CM

## 2014-12-02 DIAGNOSIS — E782 Mixed hyperlipidemia: Secondary | ICD-10-CM

## 2014-12-02 DIAGNOSIS — I10 Essential (primary) hypertension: Secondary | ICD-10-CM

## 2014-12-02 DIAGNOSIS — R7303 Prediabetes: Secondary | ICD-10-CM

## 2014-12-02 DIAGNOSIS — E559 Vitamin D deficiency, unspecified: Secondary | ICD-10-CM

## 2014-12-02 DIAGNOSIS — E669 Obesity, unspecified: Secondary | ICD-10-CM

## 2014-12-02 LAB — CBC WITH DIFFERENTIAL/PLATELET
BASOS ABS: 0 10*3/uL (ref 0.0–0.1)
Basophils Relative: 0 % (ref 0–1)
EOS PCT: 3 % (ref 0–5)
Eosinophils Absolute: 0.2 10*3/uL (ref 0.0–0.7)
HCT: 33.5 % — ABNORMAL LOW (ref 36.0–46.0)
HEMOGLOBIN: 10.3 g/dL — AB (ref 12.0–15.0)
LYMPHS ABS: 2.2 10*3/uL (ref 0.7–4.0)
LYMPHS PCT: 41 % (ref 12–46)
MCH: 24.9 pg — AB (ref 26.0–34.0)
MCHC: 30.7 g/dL (ref 30.0–36.0)
MCV: 80.9 fL (ref 78.0–100.0)
MONO ABS: 0.4 10*3/uL (ref 0.1–1.0)
MPV: 9.9 fL (ref 8.6–12.4)
Monocytes Relative: 7 % (ref 3–12)
NEUTROS ABS: 2.6 10*3/uL (ref 1.7–7.7)
NEUTROS PCT: 49 % (ref 43–77)
PLATELETS: 267 10*3/uL (ref 150–400)
RBC: 4.14 MIL/uL (ref 3.87–5.11)
RDW: 14.4 % (ref 11.5–15.5)
WBC: 5.4 10*3/uL (ref 4.0–10.5)

## 2014-12-02 LAB — HEMOGLOBIN A1C
HEMOGLOBIN A1C: 6.1 % — AB (ref <5.7)
Mean Plasma Glucose: 128 mg/dL — ABNORMAL HIGH (ref <117)

## 2014-12-02 NOTE — Patient Instructions (Addendum)
We are starting you on Metformin to prevent or treat diabetes. Metformin does not cause low blood sugars. In order to create energy your cells need insulin and sugar but sometime your cells do not accept the insulin and this can cause increased sugars and decreased energy. The Metformin helps your cells accept insulin and the sugar to give you more energy.   The two most common side effects are nausea and diarrhea, follow these rules to avoid it! You can take imodium per box instructions when starting metformin if needed.   Rules of metformin: 1) start out slow with only one pill daily. Our goal for you is 4 pills a day or  total.  2) take with your largest meal. 3) Take with least amount of carbs.   Call if you have any problems.   Please pick one of the over the counter allergy medications below and take it once daily for allergies.  Claritin or loratadine cheapest but likely the weakest  Zyrtec or certizine at night because it can make you sleepy The strongest is allegra or fexafinadine  Cheapest at [facility], [employer], [employer]  We want weight loss that will last so you should lose 1-2 pounds a week.  THAT IS IT! Please pick THREE things a month to change. Once it is a habit check off the item. Then pick another three items off the list to become habits.  If you are already doing a habit on the list GREAT!  Cross that item off! o Don't drink your calories. Ie, alcohol, soda, fruit juice, and sweet tea.  o Drink more water. Drink a glass when you feel hungry or before each meal.  o Eat breakfast - Complex carb and protein (likeDannon light and fit yogurt, oatmeal, fruit, eggs, [place] bacon). o Measure your cereal.  Eat no more than one cup a day. (ie [place]) o Eat an apple a day. o Add a vegetable a day. o Try a new vegetable a month. o Use Pam! Stop using oil or butter to cook. o Don't finish your plate or use smaller plates. o Share your dessert. o Eat sugar free Jello for dessert or  frozen grapes. o Don't eat 2-3 hours before bed. o Switch to whole wheat bread, pasta, and brown rice. o Make healthier choices when you eat out. No fries! o Pick baked chicken, NOT fried. o Don't forget to SLOW DOWN when you eat. It is not going anywhere.  o Take the stairs. o Park far away in the parking lot o [employer] (or weights) for 10 minutes while watching TV. o Walk at work for 10 minutes during break. o Walk outside 1 time a week with your friend, kids, dog, or significant other. o Start a walking group at church. o Walk the mall as much as you can tolerate.  o Keep a food diary. o Weigh yourself daily. o Walk for 15 minutes 3 days per week. o Cook at home more often and eat out less.  If life happens and you go back to old habits, it is okay.  Just start over. You can do it!   If you experience chest pain, get short of breath, or tired during the exercise, please stop immediately and inform your doctor.      Bad carbs also include fruit juice, alcohol, and sweet tea. These are empty calories that do not signal to your brain that you are full.   Please remember the good carbs are still  carbs which convert into sugar. So please measure them out no more than 1/2-1 cup of rice, oatmeal, pasta, and beans  Veggies are however free foods! Pile them on.   Not all fruit is created equal. Please see the list below, the fruit at the bottom is higher in sugars than the fruit at the top. Please avoid all dried fruits.     Before you even begin to attack a weight-loss plan, it pays to remember this: You are not fat. You have fat. Losing weight isn't about blame or shame; it's simply another achievement to accomplish. Dieting is like any other skill--you have to buckle down and work at it. As long as you act in a smart, reasonable way, you'll ultimately get where you want to be. Here are some weight loss pearls for you.  1. It's Not a Diet. It's a Lifestyle Thinking of a diet as  something you're on and suffering through only for the short term doesn't work. To shed weight and keep it off, you need to make permanent changes to the way you eat. It's OK to indulge occasionally, of course, but if you cut calories temporarily and then revert to your old way of eating, you'll gain back the weight quicker than you can say yo-yo. Use it to lose it. Research shows that one of the best predictors of long-term weight loss is how many pounds you drop in the first month. For that reason, nutritionists often suggest being stricter for the first two weeks of your new eating strategy to build momentum. Cut out added sugar and alcohol and avoid unrefined carbs. After that, figure out how you can reincorporate them in a way that's healthy and maintainable.  2. There's a Right Way to Exercise Working out burns calories and fat and boosts your metabolism by building muscle. But those trying to lose weight are notorious for overestimating the number of calories they burn and underestimating the amount they take in. Unfortunately, your system is biologically programmed to hold on to extra pounds and that means when you start exercising, your body senses the deficit and ramps up its hunger signals. If you're not diligent, you'll eat everything you burn and then some. Use it to lose it. Cardio gets all the exercise glory, but strength and interval training are the real heroes. They help you build lean muscle, which in turn increases your metabolism and calorie-burning ability 3. Don't Overreact to Mild Hunger Some people have a hard time losing weight because of hunger anxiety. To them, being hungry is bad--something to be avoided at all costs--so they carry snacks with them and eat when they don't need to. Others eat because they're stressed out or bored. While you never want to get to the point of being ravenous (that's when bingeing is likely to happen), a hunger pang, a craving, or the fact that it's  3:00 p.m. should not send you racing for the vending machine or obsessing about the energy bar in your purse. Ideally, you should put off eating until your stomach is growling and it's difficult to concentrate.  Use it to lose it. When you feel the urge to eat, use the HALT method. Ask yourself, Am I really hungry? Or am I angry or anxious, lonely or bored, or tired? If you're still not certain, try the apple test. If you're truly hungry, an apple should seem delicious; if it doesn't, something else is going on. Or you can try drinking water and making yourself  busy, if you are still hungry try a healthy snack.  4. Not All Calories Are Created Equal The mechanics of weight loss are pretty simple: Take in fewer calories than you use for energy. But the kind of food you eat makes all the difference. Processed food that's high in saturated fat and refined starch or sugar can cause inflammation that disrupts the hormone signals that tell your brain you're full. The result: You eat a lot more.  Use it to lose it. Clean up your diet. Swap in whole, unprocessed foods, including vegetables, lean protein, and healthy fats that will fill you up and give you the biggest nutritional bang for your calorie buck. In a few weeks, as your brain starts receiving regular hunger and fullness signals once again, you'll notice that you feel less hungry overall and naturally start cutting back on the amount you eat.  5. Protein, Produce, and Plant-Based Fats Are Your Weight-Loss Trinity Here's why eating the three Ps regularly will help you drop pounds. Protein fills you up. You need it to build lean muscle, which keeps your metabolism humming so that you can torch more fat. People in a weight-loss program who ate double the recommended daily allowance for protein (about 110 grams for a 150-pound woman) lost 70 percent of their weight from fat, while people who ate the RDA lost only about 40 percent, one study found. Produce is  packed with filling fiber. "It's very difficult to consume too many calories if you're eating a lot of vegetables. Example: Three cups of broccoli is a lot of food, yet only 93 calories. (Fruit is another story. It can be easy to overeat and can contain a lot of calories from sugar, so be sure to monitor your intake.) Plant-based fats like olive oil and those in avocados and nuts are healthy and extra satiating.  Use it to lose it. Aim to incorporate each of the three Ps into every meal and snack. People who eat protein throughout the day are able to keep weight off, according to a study in the American Journal of Clinical Nutrition. In addition to meat, poultry and seafood, good sources are beans, lentils, eggs, tofu, and yogurt. As for fat, keep portion sizes in check by measuring out salad dressing, oil, and nut butters (shoot for one to two tablespoons). Finally, eat veggies or a little fruit at every meal. People who did that consumed 308 fewer calories but didn't feel any hungrier than when they didn't eat more produce.  7. How You Eat Is As Important As What You Eat In order for your brain to register that you're full, you need to focus on what you're eating. Sit down whenever you eat, preferably at a table. Turn off the TV or computer, put down your phone, and look at your food. Smell it. Chew slowly, and don't put another bite on your fork until you swallow. When women ate lunch this attentively, they consumed 30 percent less when snacking later than those who listened to an audiobook at lunchtime, according to a study in the [place] Journal of Nutrition. 8. Weighing Yourself Really Works The scale provides the best evidence about whether your efforts are paying off. Seeing the numbers tick up or down or stagnate is motivation to keep going--or to rethink your approach. A 2015 study at [facility] found that daily weigh-ins helped people lose more weight, keep it off, and maintain that loss,  even after two years. Use it to lose it.  Step on the scale at the same time every day for the best results. If your weight shoots up several pounds from one weigh-in to the next, don't freak out. Eating a lot of salt the night before or having your period is the likely culprit. The number should return to normal in a day or two. It's a steady climb that you need to do something about. 9. Too Much Stress and Too Little Sleep Are Your Enemies When you're tired and frazzled, your body cranks up the production of cortisol, the stress hormone that can cause carb cravings. Not getting enough sleep also boosts your levels of ghrelin, a hormone associated with hunger, while suppressing leptin, a hormone that signals fullness and satiety. People on a diet who slept only five and a half hours a night for two weeks lost 55 percent less fat and were hungrier than those who slept eight and a half hours, according to a study in the [place] [employer] Journal. Use it to lose it. Prioritize sleep, aiming for seven hours or more a night, which research shows helps lower stress. And make sure you're getting quality zzz's. If a snoring spouse or a fidgety cat wakes you up frequently throughout the night, you may end up getting the equivalent of just four hours of sleep, according to a study from [facility]. Keep pets out of the bedroom, and use a white-noise app to drown out snoring. 10. You Will Hit a plateau--And You Can Bust Through It As you slim down, your body releases much less leptin, the fullness hormone.  If you're not strength training, start right now. Building muscle can raise your metabolism to help you overcome a plateau. To keep your body challenged and burning calories, incorporate new moves and more intense intervals into your workouts or add another sweat session to your weekly routine. Alternatively, cut an extra 100 calories or so a day from your diet. Now that you've lost weight, your  body simply doesn't need as much fuel.   Ways to cut 100 calories  1. Eat your eggs with hot sauce OR salsa instead of cheese.  Eggs are great for breakfast, but many people consider eggs and cheese to be BFFs. Instead of cheese--1 oz. of cheddar has 114 calories--top your eggs with hot sauce, which contains no calories and helps with satiety and metabolism. Salsa is also a great option!!  2. Top your toast, waffles or pancakes with mashed berries instead of jelly or syrup. Half a cup of berries--fresh, frozen or thawed--has about 40 calories, compared with 2 tbsp. of maple syrup or jelly, which both have about 100 calories. The berries will also give you a good punch of fiber, which helps keep you full and satisfied and won't spike blood sugar quickly like the jelly or syrup. 3. Swap the non-fat latte for black coffee with a splash of half-and-half. Contrary to its name, that non-fat latte has 130 calories and a startling 19g of carbohydrates per 16 oz. serving. Replacing that 'light' drinkable dessert with a black coffee with a splash of half-and-half saves you more than 100 calories per 16 oz. serving. 4. Sprinkle salads with freeze-dried raspberries instead of dried cranberries. If you want a sweet addition to your nutritious salad, stay away from dried cranberries. They have a whopping 130 calories per  cup and 30g carbohydrates. Instead, sprinkle freeze-dried raspberries guilt-free and save more than 100 calories per  cup serving, adding 3g of belly-filling fiber. 5. Go  for mustard in place of mayo on your sandwich. Mustard can add really nice flavor to any sandwich, and there are tons of varieties, from spicy to honey. A serving of mayo is 95 calories, versus 10 calories in a serving of mustard. 6. Choose a DIY salad dressing instead of the store-bought kind. Mix Dijon or whole grain mustard with low-fat Kefir or red wine vinegar and garlic. 7. Use hummus as a spread instead of a dip. Use  hummus as a spread on a high-fiber cracker or tortilla with a sandwich and save on calories without sacrificing taste. 8. Pick just one salad "accessory." Salad isn't automatically a calorie winner. It's easy to over-accessorize with toppings. Instead of topping your salad with nuts, avocado and cranberries (all three will clock in at 313 calories), just pick one. The next day, choose a different accessory, which will also keep your salad interesting. You don't wear all your jewelry every day, right? 9. Ditch the white pasta in favor of spaghetti squash. One cup of cooked spaghetti squash has about 40 calories, compared with traditional spaghetti, which comes with more than 200. Spaghetti squash is also nutrient-dense. It's a good source of fiber and Vitamins A and C, and it can be eaten just like you would eat pasta--with a great tomato sauce and [place] meatballs or with pesto, tofu and spinach, for example. 10. Dress up your chili, soups and stews with non-fat [place] yogurt instead of sour cream. Just a 'dollop' of sour cream can set you back 115 calories and a whopping 12g of fat--seven of which are of the artery-clogging variety. Added bonus: [place] yogurt is packed with muscle-building protein, calcium and B Vitamins. 11. Mash cauliflower instead of mashed potatoes. One cup of traditional mashed potatoes--in all their creamy goodness--has more than 200 calories, compared to mashed cauliflower, which you can typically eat for less than 100 calories per 1 cup serving. Cauliflower is a great source of the antioxidant indole-3-carbinol (I3C), which may help reduce the risk of some cancers, like breast cancer. 12. Ditch the ice cream sundae in favor of a [place] yogurt parfait. Instead of a cup of ice cream or fro-yo for dessert, try 1 cup of nonfat Greek yogurt topped with fresh berries and a sprinkle of cacao nibs. Both toppings are packed with antioxidants, which can help reduce cellular inflammation and  oxidative damage. And the comparison is a no-brainer: One cup of ice cream has about 275 calories; one cup of frozen yogurt has about 230; and a cup of Greek yogurt has just 130, plus twice the protein, so you're less likely to return to the freezer for a second helping. 13. Put olive oil in a spray container instead of using it directly from the bottle. Each tablespoon of olive oil is 120 calories and 15g of fat. Use a mister instead of pouring it straight into the pan or onto a salad. This allows for portion control and will save you more than 100 calories. 14. When baking, substitute canned pumpkin for butter or oil. Canned pumpkin--not pumpkin pie mix--is loaded with Vitamin A, which is important for skin and eye health, as well as immunity. And the comparisons are pretty crazy:  cup of canned pumpkin has about 40 calories, compared to butter or oil, which has more than 800 calories. Yes, 800 calories. Applesauce and mashed banana can also serve as good substitutions for butter or oil, usually in a 1:1 ratio. 15. Top casseroles with high-fiber cereal instead of breadcrumbs.  Breadcrumbs are typically made with white bread, while breakfast cereals contain 5-9g of fiber per serving. Not only will you save more than 150 calories per  cup serving, the swap will also keep you more full and you'll get a metabolism boost from the added fiber. 16. Snack on pistachios instead of macadamia nuts. Believe it or not, you get the same amount of calories from 35 pistachios (100 calories) as you would from only five macadamia nuts. 17. Chow down on kale chips rather than potato chips. This is my favorite 'don't knock it 'till you try it' swap. Kale chips are so easy to make at home, and you can spice them up with a little grated parmesan or chili powder. Plus, they're a mere fraction of the calories of potato chips, but with the same crunch factor we crave so often. 18. Add seltzer and some fruit slices to your  cocktail instead of soda or fruit juice. One cup of soda or fruit juice can pack on as much as 140 calories. Instead, use seltzer and fruit slices. The fruit provides valuable phytochemicals, such as flavonoids and anthocyanins, which help to combat cancer and stave off the aging process.

## 2014-12-02 NOTE — Progress Notes (Signed)
Assessment and Plan:  1. Hypertension -Continue medication, monitor blood pressure at home. Continue DASH diet.  Reminder to go to the ER if any CP, SOB, nausea, dizziness, severe HA, changes vision/speech, left arm numbness and tingling and jaw pain.  2. Cholesterol -Continue diet and exercise. Check cholesterol.   3. Prediabetes  -Continue diet and exercise. Check A1C - increase MF to BID - stop soda  4. Vitamin D Def - check level and continue medications.   5. Obesity with co morbidities - long discussion about weight loss, diet, and exercise  6. Wheezing Lungs CTAB, O2 98% RA, no CP/edema/orthopnea/PND Likely allergies versus deconditioning- get on allergy pill   Continue diet and meds as discussed. Further disposition pending results of labs. Over 30 minutes of exam, counseling, chart review, and critical decision making was performed  HPI 44 y.o. female  presents for 3 month follow up on hypertension, cholesterol, prediabetes, and vitamin D deficiency.  Complains of rash on right shoulder x 1 week, not tried anything on it.   Her blood pressure has been controlled at home, today their BP is BP: 128/88 mmHg  She does not workout. She denies chest pain, shortness of breath, dizziness.  She is on cholesterol medication, pravastatin 40 mg QD and denies myalgias. Her cholesterol is at goal. The cholesterol last visit was:   Lab Results  Component Value Date   CHOL 156 08/19/2014   HDL 44 08/19/2014   LDLCALC 94 08/19/2014   TRIG 88 08/19/2014   CHOLHDL 3.5 08/19/2014    She has been working on diet and exercise for prediabetes, and denies paresthesia of the feet, polydipsia, polyuria and visual disturbances. She is on metformin 1 pill a day for weight loss and insulin resistance. She does drink 1 regular soda a day. Last A1C in the office was:  Lab Results  Component Value Date   HGBA1C 5.9* 08/19/2014  Patient is not on Vitamin D supplement.   Lab Results  Component  Value Date   VD25OH 28* 08/19/2014  BMI is Body mass index is 40.43 kg/(m^2)., she is working on diet and exercise. Wt Readings from Last 3 Encounters:  12/02/14 207 lb (93.895 kg)  09/08/14 211 lb (95.709 kg)  08/19/14 198 lb 6.4 oz (89.994 kg)    Current Medications:  Current Outpatient Prescriptions on File Prior to Visit  Medication Sig Dispense Refill  . benazepril-hydrochlorthiazide (LOTENSIN HCT) 20-25 MG per tablet TAKE ONE TABLET BY MOUTH DAILY FOR BLOOD PRESSURE 90 tablet 3  . cholecalciferol (VITAMIN D) 1000 UNITS tablet Take 1,000 Units by mouth daily.    Marland Kitchen. levalbuterol (XOPENEX HFA) 45 MCG/ACT inhaler Inhale 2 puffs into the lungs every 4 (four) hours as needed for wheezing. For shortness of breath 1 Inhaler 99  . meloxicam (MOBIC) 15 MG tablet Take 1 tablet (15 mg total) by mouth daily. Take 1 daily with food. 10 tablet 0  . metFORMIN (GLUCOPHAGE-XR) 500 MG 24 hr tablet Take 1 tablet (500 mg total) by mouth daily with breakfast. 90 tablet 3  . potassium chloride SA (K-DUR,KLOR-CON) 20 MEQ tablet Take 1 tablet (20 mEq total) by mouth daily. 90 tablet 3  . pravastatin (PRAVACHOL) 40 MG tablet TAKE ONE TABLET BY MOUTH ONCE DAILY AT BEDTIME FOR CHOLESTEROL 90 tablet 3   No current facility-administered medications on file prior to visit.   Medical History:  Past Medical History  Diagnosis Date  . Hypertension   . High cholesterol   . Asthma   .  DM (diabetes mellitus)     borderline  . Low blood potassium   . Iron deficiency anemia   . Allergic rhinitis    Allergies: No Known Allergies   Review of Systems:  Review of Systems  Constitutional: Negative.   HENT: Positive for congestion. Negative for ear discharge, ear pain, hearing loss, nosebleeds, sore throat and tinnitus.   Eyes: Negative.   Respiratory: Positive for wheezing. Negative for cough, hemoptysis, sputum production, shortness of breath and stridor.   Cardiovascular: Negative.  Negative for chest pain,  palpitations, orthopnea, leg swelling and PND.  Gastrointestinal: Negative.   Genitourinary: Negative.   Musculoskeletal: Negative.   Skin: Positive for rash. Negative for itching.  Neurological: Negative.  Negative for headaches.  Endo/Heme/Allergies: Negative.   Psychiatric/Behavioral: Negative.     Family history- Review and unchanged Social history- Review and unchanged Physical Exam: BP 128/88 mmHg  Pulse 60  Temp(Src) 97.8 F (36.6 C)  Resp 16  Ht 5' (1.524 m)  Wt 207 lb (93.895 kg)  BMI 40.43 kg/m2 Wt Readings from Last 3 Encounters:  12/02/14 207 lb (93.895 kg)  09/08/14 211 lb (95.709 kg)  08/19/14 198 lb 6.4 oz (89.994 kg)   General Appearance: Well nourished, in no apparent distress. Eyes: PERRLA, EOMs, conjunctiva no swelling or erythema Sinuses: No Frontal/maxillary tenderness ENT/Mouth: Ext aud canals clear, TMs without erythema, bulging. No erythema, swelling, or exudate on post pharynx.  Tonsils not swollen or erythematous. Hearing normal.  Neck: Supple, thyroid normal.  Respiratory: Respiratory effort normal, BS equal bilaterally without rales, rhonchi, wheezing or stridor.  Cardio: RRR with no MRGs. Brisk peripheral pulses without edema.  Abdomen: Soft, + BS, obese,  Non tender, no guarding, rebound, hernias, masses. Lymphatics: Non tender without lymphadenopathy.  Musculoskeletal: Full ROM, 5/5 strength, Normal gait Skin: right shoulder with a cluster of erythematous bumps with excoriations. Warm, dry without lesions, ecchymosis.  Neuro: Cranial nerves intact. Normal muscle tone, no cerebellar symptoms. Psych: Awake and oriented X 3, normal affect, Insight and Judgment appropriate.    Quentin Mulling, PA-C 8:46 AM [facility]

## 2014-12-03 LAB — HEPATIC FUNCTION PANEL
ALK PHOS: 43 U/L (ref 39–117)
ALT: 20 U/L (ref 0–35)
AST: 16 U/L (ref 0–37)
Albumin: 3.7 g/dL (ref 3.5–5.2)
BILIRUBIN INDIRECT: 0.4 mg/dL (ref 0.2–1.2)
BILIRUBIN TOTAL: 0.5 mg/dL (ref 0.2–1.2)
Bilirubin, Direct: 0.1 mg/dL (ref 0.0–0.3)
Total Protein: 6.9 g/dL (ref 6.0–8.3)

## 2014-12-03 LAB — BASIC METABOLIC PANEL WITH GFR
BUN: 7 mg/dL (ref 6–23)
CALCIUM: 9 mg/dL (ref 8.4–10.5)
CO2: 28 mEq/L (ref 19–32)
CREATININE: 0.6 mg/dL (ref 0.50–1.10)
Chloride: 103 mEq/L (ref 96–112)
GFR, Est African American: >89 mL/min
GFR, Est Non African American: >89 mL/min
GLUCOSE: 92 mg/dL (ref 70–99)
Potassium: 3.6 mEq/L (ref 3.5–5.3)
SODIUM: 138 meq/L (ref 135–145)

## 2014-12-03 LAB — LIPID PANEL
Cholesterol: 134 mg/dL (ref 0–200)
HDL: 44 mg/dL — AB (ref >=46)
LDL Cholesterol: 71 mg/dL (ref 0–99)
Total CHOL/HDL Ratio: 3 Ratio
Triglycerides: 97 mg/dL (ref <150)
VLDL: 19 mg/dL (ref 0–40)

## 2014-12-03 LAB — INSULIN, FASTING: Insulin fasting, serum: 23 u[IU]/mL — ABNORMAL HIGH (ref 2.0–19.6)

## 2014-12-03 LAB — TSH: TSH: 1.31 u[IU]/mL (ref 0.350–4.500)

## 2014-12-03 LAB — MAGNESIUM: MAGNESIUM: 1.8 mg/dL (ref 1.5–2.5)

## 2014-12-03 LAB — VITAMIN D 25 HYDROXY (VIT D DEFICIENCY, FRACTURES): Vit D, 25-Hydroxy: 21 ng/mL — ABNORMAL LOW (ref 30–100)

## 2014-12-25 ENCOUNTER — Emergency Department (HOSPITAL_COMMUNITY): Payer: BLUE CROSS/BLUE SHIELD

## 2014-12-25 ENCOUNTER — Encounter (HOSPITAL_COMMUNITY): Payer: Self-pay | Admitting: Emergency Medicine

## 2014-12-25 ENCOUNTER — Emergency Department (HOSPITAL_COMMUNITY)
Admission: EM | Admit: 2014-12-25 | Discharge: 2014-12-25 | Disposition: A | Discharge status: Home / Self Care | Payer: BLUE CROSS/BLUE SHIELD | Attending: Emergency Medicine | Admitting: Emergency Medicine

## 2014-12-25 DIAGNOSIS — Z791 Long term (current) use of non-steroidal anti-inflammatories (NSAID): Secondary | ICD-10-CM | POA: Diagnosis not present

## 2014-12-25 DIAGNOSIS — E780 Pure hypercholesterolemia: Secondary | ICD-10-CM | POA: Diagnosis not present

## 2014-12-25 DIAGNOSIS — Z79899 Other long term (current) drug therapy: Secondary | ICD-10-CM | POA: Insufficient documentation

## 2014-12-25 DIAGNOSIS — Z862 Personal history of diseases of the blood and blood-forming organs and certain disorders involving the immune mechanism: Secondary | ICD-10-CM | POA: Diagnosis not present

## 2014-12-25 DIAGNOSIS — E876 Hypokalemia: Secondary | ICD-10-CM | POA: Insufficient documentation

## 2014-12-25 DIAGNOSIS — I10 Essential (primary) hypertension: Secondary | ICD-10-CM | POA: Diagnosis not present

## 2014-12-25 DIAGNOSIS — J45909 Unspecified asthma, uncomplicated: Secondary | ICD-10-CM | POA: Diagnosis not present

## 2014-12-25 DIAGNOSIS — J159 Unspecified bacterial pneumonia: Secondary | ICD-10-CM | POA: Insufficient documentation

## 2014-12-25 DIAGNOSIS — J189 Pneumonia, unspecified organism: Secondary | ICD-10-CM

## 2014-12-25 DIAGNOSIS — R05 Cough: Secondary | ICD-10-CM | POA: Diagnosis present

## 2014-12-25 LAB — CBC
HEMATOCRIT: 30 % — AB (ref 36.0–46.0)
Hemoglobin: 9.6 g/dL — ABNORMAL LOW (ref 12.0–15.0)
MCH: 25.1 pg — ABNORMAL LOW (ref 26.0–34.0)
MCHC: 32 g/dL (ref 30.0–36.0)
MCV: 78.5 fL (ref 78.0–100.0)
PLATELETS: 283 10*3/uL (ref 150–400)
RBC: 3.82 MIL/uL — AB (ref 3.87–5.11)
RDW: 14.6 % (ref 11.5–15.5)
WBC: 4.8 10*3/uL (ref 4.0–10.5)

## 2014-12-25 LAB — BASIC METABOLIC PANEL
ANION GAP: 9 (ref 5–15)
BUN: 7 mg/dL (ref 6–20)
CO2: 24 mmol/L (ref 22–32)
Calcium: 8.4 mg/dL — ABNORMAL LOW (ref 8.9–10.3)
Chloride: 102 mmol/L (ref 101–111)
Creatinine, Ser: 0.83 mg/dL (ref 0.44–1.00)
GFR calc Af Amer: >60 mL/min (ref >60)
GFR calc non Af Amer: >60 mL/min (ref >60)
Glucose, Bld: 108 mg/dL — ABNORMAL HIGH (ref 65–99)
Potassium: 3.1 mmol/L — ABNORMAL LOW (ref 3.5–5.1)
Sodium: 135 mmol/L (ref 135–145)

## 2014-12-25 LAB — I-STAT TROPONIN, ED: TROPONIN I, POC: 0 ng/mL (ref 0.00–0.08)

## 2014-12-25 MED ORDER — AZITHROMYCIN 250 MG PO TABS: 250.0000 mg | ORAL_TABLET | Freq: Every day | ORAL | Status: DC

## 2014-12-25 MED ORDER — HYDROCOD POLST-CPM POLST ER 10-8 MG/5ML PO SUER: 5.0000 mL | Freq: Two times a day (BID) | ORAL | Status: DC | PRN

## 2014-12-25 MED ORDER — ALBUTEROL SULFATE HFA 108 (90 BASE) MCG/ACT IN AERS
2.0000 | INHALATION_SPRAY | Freq: Once | RESPIRATORY_TRACT | Status: AC
  Administered 2014-12-25: 2 via RESPIRATORY_TRACT
  Filled 2014-12-25: qty 6.7

## 2014-12-25 NOTE — ED Notes (Signed)
Patient with cough since yesterday.  Patient states she has asthma.  Patient denies any chest pain but she does have pain in epigastric area from all the coughing.  Patient does feel short of breath.

## 2014-12-25 NOTE — ED Provider Notes (Signed)
CSN: [id number]     Arrival date & time 12/25/14  2032 History   First MD Initiated Contact with Patient 12/25/14 2214     Chief Complaint  Patient presents with  . Cough  . Asthma     (Consider location/radiation/quality/duration/timing/severity/associated sxs/prior Treatment) The history is provided by the patient.     Pt with hx asthma, diabetes, HTN, p/w cough that began yesterday.  Has had mild SOB, chills.  Body is sore from coughing.  Denies known fevers, chest pain, other URI symptoms.  Denies leg swelling, recent immobilization, exogenous estrogen.   Is out of albuterol at home.    Past Medical History  Diagnosis Date  . Hypertension   . High cholesterol   . Asthma   . DM (diabetes mellitus)     borderline  . Low blood potassium   . Iron deficiency anemia   . Allergic rhinitis    Past Surgical History  Procedure Laterality Date  . Cesarean section      x 4  . Umbilical hernia repair  1977  . Tubal ligation  1996   Family History  Problem Relation Age of Onset  . Breast cancer Maternal Aunt   . Diabetes Mother   . Diabetes Maternal Aunt   . Heart disease Maternal Grandmother     great grandmother  . Stroke Mother   . CAD Neg Hx    History  Substance Use Topics  . Smoking status: Never Smoker   . Smokeless tobacco: Never Used  . Alcohol Use: No   OB History    No data available     Review of Systems  Constitutional: Positive for chills. Negative for fever.  Respiratory: Positive for cough.   Cardiovascular: Negative for chest pain and leg swelling.  Gastrointestinal: Negative for abdominal pain.  Musculoskeletal: Positive for myalgias.  Skin: Negative for rash.  Allergic/Immunologic: Positive for immunocompromised state (diabetes).  Psychiatric/Behavioral: Negative for self-injury.      Allergies  Review of patient's allergies indicates no known allergies.  Home Medications   Prior to Admission medications   Medication Sig Start Date End  Date Taking? Authorizing Provider  azithromycin (ZITHROMAX) 250 MG tablet Take 1 tablet (250 mg total) by mouth daily. Take first 2 tablets together, then 1 every day until finished. 12/25/14   Trixie DredgeEmily Stoney Karczewski, PA-C  benazepril-hydrochlorthiazide (LOTENSIN HCT) 20-25 MG per tablet TAKE ONE TABLET BY MOUTH DAILY FOR BLOOD PRESSURE 10/10/14   Lucky CowboyWilliam McKeown, MD  chlorpheniramine-HYDROcodone [facility](TUSSIONEX PENNKINETIC ER) 10-8 MG/5ML SUER Take 5 mLs by mouth every 12 (twelve) hours as needed for cough (and pain). 12/25/14   Trixie DredgeEmily Damaris Abeln, PA-C  cholecalciferol (VITAMIN D) 1000 UNITS tablet Take 1,000 Units by mouth daily.    Historical Provider, MD  levalbuterol [facility](XOPENEX HFA) 45 MCG/ACT inhaler Inhale 2 puffs into the lungs every 4 (four) hours as needed for wheezing. For shortness of breath 09/07/13   Lucky CowboyWilliam McKeown, MD  meloxicam (MOBIC) 15 MG tablet Take 1 tablet (15 mg total) by mouth daily. Take 1 daily with food. 07/15/14   Arthor CaptainAbigail Harris, PA-C  metFORMIN (GLUCOPHAGE-XR) 500 MG 24 hr tablet Take 1 tablet (500 mg total) by mouth daily with breakfast. 10/10/14   Lucky CowboyWilliam McKeown, MD  potassium chloride SA (K-DUR,KLOR-CON) 20 MEQ tablet Take 1 tablet (20 mEq total) by mouth daily. 10/10/14 05/11/15  Lucky CowboyWilliam McKeown, MD  pravastatin (PRAVACHOL) 40 MG tablet TAKE ONE TABLET BY MOUTH ONCE DAILY AT BEDTIME FOR CHOLESTEROL 10/10/14 05/11/15  Lucky CowboyWilliam McKeown, MD  BP 131/68 mmHg  Pulse 66  Temp(Src) 98.7 F (37.1 C) (Oral)  Resp 18  SpO2 99%  LMP 12/18/2014 (Approximate) Physical Exam  Constitutional: She appears well-developed and well-nourished. No distress.  HENT:  Head: Normocephalic and atraumatic.  Neck: Neck supple.  Cardiovascular: Normal rate and regular rhythm.   Pulmonary/Chest: Effort normal and breath sounds normal. No respiratory distress. She has no wheezes. She has no rales.  Abdominal: Soft. She exhibits no distension. There is no tenderness. There is no rebound and no guarding.  Musculoskeletal: She  exhibits no edema.  Neurological: She is alert.  Skin: She is not diaphoretic.  Psychiatric: She has a normal mood and affect. Her behavior is normal.  Nursing note and vitals reviewed.   ED Course  Procedures (including critical care time) Labs Review Labs Reviewed  CBC - Abnormal; Notable for the following:    RBC 3.82 (*)    Hemoglobin 9.6 (*)    HCT 30.0 (*)    MCH 25.1 (*)    All other components within normal limits  BASIC METABOLIC PANEL - Abnormal; Notable for the following:    Potassium 3.1 (*)    Glucose, Bld 108 (*)    Calcium 8.4 (*)    All other components within normal limits  Rosezena SensorI-STAT TROPOININ, ED    Imaging Review Dg Chest 2 View  12/25/2014   CLINICAL DATA:  Productive cough and congestion for 5 days.  Asthma.  EXAM: CHEST  2 VIEW  COMPARISON:  01/11/2012  FINDINGS: Patchy right lower lobe airspace consolidation consistent with pneumonia. Left lung is clear. The cardiomediastinal contours are normal. Pulmonary vasculature is normal. No pleural effusion or pneumothorax. No acute osseous abnormalities are seen.  IMPRESSION: Patchy right lower lobe consolidation consistent with pneumonia. Followup PA and lateral chest X-ray is recommended in 3-4 weeks following trial of antibiotic therapy to ensure resolution and exclude underlying malignancy.   Electronically Signed   By: Rubye OaksMelanie  Ehinger M.D.   On: 12/25/2014 21:43     EKG Interpretation None      MDM   Final diagnoses:  CAP (community acquired pneumonia)   Afebrile, nontoxic patient with cough that began yesterday.  Mild SOB.  No inhaler at home.  Hx asthma.  CXR shows patchy infiltrate.  No known risk factors for blood clot.    D/C home with z-pak, albuterol, tussionex, PCP follow up.  Discussed result, findings, treatment, and follow up  with patient.  Pt given return precautions.  Pt verbalizes understanding and agrees with plan.         Trixie Dredgemily Jenson Beedle, PA-C 12/25/14 2341  Benjiman CoreNathan Pickering, MD 12/25/14  [phone]

## 2014-12-25 NOTE — ED Notes (Signed)
Patient reports dry cough since yesterday with hx of asthma. Reports that she got zpak filled today. Pt denies fever or sob  but does report some chills. Alert, oriented, nad

## 2014-12-25 NOTE — Discharge Instructions (Signed)
Read the information below.  Use the prescribed medication as directed.  Please discuss all new medications with your pharmacist.  You may return to the Emergency Department at any time for worsening condition or any new symptoms that concern you.   If you develop worsening shortness of breath, uncontrolled wheezing, severe chest pain, or fevers despite using tylenol and/or ibuprofen, return for a recheck.        Pneumonia, Adult Pneumonia is an infection of the lungs. It may be caused by a germ (virus or bacteria). Some types of pneumonia can spread easily from person to person. This can happen when you cough or sneeze. HOME CARE  Only take medicine as told by your doctor.  Take your medicine (antibiotics) as told. Finish it even if you start to feel better.  Do not smoke.  You may use a vaporizer or humidifier in your room. This can help loosen thick spit (mucus).  Sleep so you are almost sitting up (semi-upright). This helps reduce coughing.  Rest. A shot (vaccine) can help prevent pneumonia. Shots are often advised for:  People over 43 years old.  Patients on chemotherapy.  People with long-term (chronic) lung problems.  People with immune system problems. GET HELP RIGHT AWAY IF:   You are getting worse.  You cannot control your cough, and you are losing sleep.  You cough up blood.  Your pain gets worse, even with medicine.  You have a fever.  Any of your problems are getting worse, not better.  You have shortness of breath or chest pain. MAKE SURE YOU:   Understand these instructions.  Will watch your condition.  Will get help right away if you are not doing well or get worse. Document Released: 01/15/2008 Document Revised: 10/21/2011 Document Reviewed: 10/19/2010 [facility] Patient Information 2015 [place], [place]. This information is not intended to replace advice given to you by your health care provider. Make sure you discuss any questions you have with your  health care provider.

## 2014-12-29 ENCOUNTER — Encounter: Payer: Self-pay | Admitting: Internal Medicine

## 2014-12-29 ENCOUNTER — Ambulatory Visit (INDEPENDENT_AMBULATORY_CARE_PROVIDER_SITE_OTHER): Payer: BLUE CROSS/BLUE SHIELD | Admitting: Internal Medicine

## 2014-12-29 VITALS — BP 124/80 | HR 72 | Temp 97.3°F | Resp 16 | Ht 60.0 in | Wt 202.2 lb

## 2014-12-29 DIAGNOSIS — J189 Pneumonia, unspecified organism: Secondary | ICD-10-CM

## 2014-12-29 DIAGNOSIS — I10 Essential (primary) hypertension: Secondary | ICD-10-CM

## 2014-12-29 DIAGNOSIS — J45901 Unspecified asthma with (acute) exacerbation: Secondary | ICD-10-CM

## 2014-12-29 MED ORDER — LEVOFLOXACIN 500 MG PO TABS: ORAL_TABLET | ORAL | Status: AC

## 2014-12-29 MED ORDER — PREDNISONE 20 MG PO TABS: ORAL_TABLET | ORAL | Status: AC

## 2014-12-29 MED ORDER — PROMETHAZINE-DM 6.25-15 MG/5ML PO SYRP: ORAL_SOLUTION | ORAL | Status: AC

## 2014-12-29 MED ORDER — BENAZEPRIL HCL 20 MG PO TABS: ORAL_TABLET | ORAL | Status: DC

## 2014-12-29 NOTE — Patient Instructions (Addendum)
  Stop Benazepril - HCTZ  And start plain   Benazepril 20 mg daily  Continue Potassium pills (KCl) 2 x daily for now  ========================================  Use spacer with rescue inhaler  7  Use Advair 100/50 once daily  ----------------------------------------------  Return in 2 weeks to check BP, Lungs & potassium

## 2014-12-29 NOTE — Progress Notes (Signed)
Subjective:    Patient ID: Alexis Marquez, female    DOB: Sep 12, 1970, 44 y.o.   MRN: [id number]  HPI Patient presents for ER fu 5 days previous after Dx and Tx for suspected RLL patchy CAP and has completed Z pak. She persiste with a brassy cough , hoarseness, laryngitis an minimal sputum pdn of a mucopurulent quality. Denies rashes, fever, chills or dyspnea.    Er labs showed low K+ 3.1 and patient is on an ACEi - hctz combo.  Medication Sig  . cholecalciferol (VITAMIN D) 1000 UNITS tablet Take 1,000 Units by mouth daily.  Marland Kitchen. levalbuterol (XOPENEX HFA) 45 MCG/ACT inhaler Inhale 2 puffs into the lungs every 4 (four) hours as needed for wheezing. For shortness of breath  . metFORMIN (GLUCOPHAGE-XR) 500 MG 24 hr tablet Take 1 tablet (500 mg total) by mouth daily with breakfast.  . potassium chloride SA (K-DUR,KLOR-CON) 20 MEQ tablet Take 1 tablet (20 mEq total) by mouth daily.  . pravastatin (PRAVACHOL) 40 MG tablet TAKE ONE TABLET BY MOUTH ONCE DAILY AT BEDTIME FOR CHOLESTEROL  . benazepril-hydrochlorthiazide (LOTENSIN HCT) 20-25 MG per tablet TAKE ONE TABLET BY MOUTH DAILY FOR BLOOD PRESSURE  . azithromycin (ZITHROMAX) 250 MG tablet Take 1 tablet (250 mg total) by mouth daily. Take first 2 tablets together, then 1 every day until finished.  . chlorpheniramine-HYDROcodone (TUSSIONEX PENNKINETIC ER) 10-8 MG/5ML SUER Take 5 mLs by mouth every 12 (twelve) hours as needed for cough (and pain).  . meloxicam (MOBIC) 15 MG tablet Take 1 tablet (15 mg total) by mouth daily. Take 1 daily with food.   Allergies  Allergen Reactions  . Tussionex Pennkinetic Er [Hydrocod Polst-Cpm Polst Er]     causes headache   Past Medical History  Diagnosis Date  . Hypertension   . High cholesterol   . Asthma   . DM (diabetes mellitus)     borderline  . Low blood potassium   . Iron deficiency anemia   . Allergic rhinitis    Review of Systems 10 point systems review negative except as above.     Objective:   Physical Exam BP 124/80   Pulse 72  Temp 97.3 F   Resp 16  Ht 5'   Wt 202 lb 3.2 oz  O2 sat = 95%    BMI 39.49   In no Distress. Speech & decreased in volume.  HEENT - Eac's patent. TM's Nl. EOM's full. PERRLA. NasoOroPharynx clear. Neck - supple. Nl Thyroid. Carotids 2+ & No bruits, nodes, JVD Chest - Clear equal BS w/ fer scattered dry rales Right base posterior rales and no rhonchi, but has a few fine forced post-tussive end expiratory wheezes.  Cor - Nl HS. RRR w/o sig MGR. PP 1(+). No edema. MS- FROM w/o deformities. Muscle power, tone and bulk Nl. Gait Nl. Neuro - No obvious Cr N abnormalities. Sensory, motor and Cerebellar functions appear Nl w/o focal abnormalities.     Assessment & Plan:   1. Pneumonia  - levofloxacin (LEVAQUIN) 500 MG tablet; Take 1 tablet daily for infection  Dispense: 10 tablet; Refill: 0  2. Essential hypertension  - benazepril (LOTENSIN) 20 MG tablet; Take 1 tablet daily for BP & Kidney Protection  Dispense: 90 tablet; Refill: 1  3. Asthma with acute exacerbation  - d/c BP med w/ hctz and monitor BP's - also advised to increase KCL 20 meq to bid until 2 week f/u  - predniSONE (DELTASONE) 20 MG tablet; 1 tab 3  x day for 2 days, then 1 tab 2 x day for 2 days, then 1 tab 1 x day for 3 days  Dispense: 13 tablet; Refill: 0  - given sx Advair 100/50 #14 doses (patient has used in the past)   - given sx Vortex spacer to use with her rescue inhaler.   - ROV 2 weeks for f/u and to  recheck BMET

## 2015-01-13 ENCOUNTER — Ambulatory Visit: Payer: Self-pay | Admitting: Internal Medicine

## 2015-03-16 ENCOUNTER — Other Ambulatory Visit: Payer: Self-pay | Admitting: *Deleted

## 2015-03-16 DIAGNOSIS — E782 Mixed hyperlipidemia: Secondary | ICD-10-CM

## 2015-03-16 MED ORDER — PRAVASTATIN SODIUM 40 MG PO TABS: ORAL_TABLET | ORAL | Status: DC

## 2015-03-23 ENCOUNTER — Encounter: Payer: Self-pay | Admitting: Physician Assistant

## 2015-04-22 ENCOUNTER — Other Ambulatory Visit: Payer: Self-pay | Admitting: Internal Medicine

## 2015-05-16 ENCOUNTER — Encounter: Payer: Self-pay | Admitting: Physician Assistant

## 2015-05-23 ENCOUNTER — Other Ambulatory Visit: Payer: Self-pay | Admitting: Internal Medicine

## 2015-06-09 ENCOUNTER — Other Ambulatory Visit (HOSPITAL_COMMUNITY)
Admission: RE | Admit: 2015-06-09 | Discharge: 2015-06-09 | Disposition: A | Discharge status: Home / Self Care | Payer: BLUE CROSS/BLUE SHIELD | Source: Ambulatory Visit | Attending: Physician Assistant | Admitting: Physician Assistant

## 2015-06-09 ENCOUNTER — Ambulatory Visit (INDEPENDENT_AMBULATORY_CARE_PROVIDER_SITE_OTHER): Payer: BLUE CROSS/BLUE SHIELD | Admitting: Physician Assistant

## 2015-06-09 ENCOUNTER — Encounter: Payer: Self-pay | Admitting: Physician Assistant

## 2015-06-09 VITALS — BP 128/72 | HR 62 | Temp 98.1°F | Resp 16 | Ht 60.0 in | Wt 196.0 lb

## 2015-06-09 DIAGNOSIS — R7303 Prediabetes: Secondary | ICD-10-CM

## 2015-06-09 DIAGNOSIS — Z79899 Other long term (current) drug therapy: Secondary | ICD-10-CM

## 2015-06-09 DIAGNOSIS — E559 Vitamin D deficiency, unspecified: Secondary | ICD-10-CM

## 2015-06-09 DIAGNOSIS — Z01419 Encounter for gynecological examination (general) (routine) without abnormal findings: Secondary | ICD-10-CM | POA: Diagnosis present

## 2015-06-09 DIAGNOSIS — Z0000 Encounter for general adult medical examination without abnormal findings: Secondary | ICD-10-CM | POA: Diagnosis not present

## 2015-06-09 DIAGNOSIS — Z1151 Encounter for screening for human papillomavirus (HPV): Secondary | ICD-10-CM | POA: Diagnosis present

## 2015-06-09 DIAGNOSIS — E876 Hypokalemia: Secondary | ICD-10-CM

## 2015-06-09 DIAGNOSIS — I10 Essential (primary) hypertension: Secondary | ICD-10-CM | POA: Diagnosis not present

## 2015-06-09 DIAGNOSIS — E782 Mixed hyperlipidemia: Secondary | ICD-10-CM

## 2015-06-09 DIAGNOSIS — E669 Obesity, unspecified: Secondary | ICD-10-CM

## 2015-06-09 DIAGNOSIS — Z0001 Encounter for general adult medical examination with abnormal findings: Secondary | ICD-10-CM

## 2015-06-09 DIAGNOSIS — Z124 Encounter for screening for malignant neoplasm of cervix: Secondary | ICD-10-CM

## 2015-06-09 DIAGNOSIS — Z23 Encounter for immunization: Secondary | ICD-10-CM

## 2015-06-09 DIAGNOSIS — J45909 Unspecified asthma, uncomplicated: Secondary | ICD-10-CM

## 2015-06-09 DIAGNOSIS — D509 Iron deficiency anemia, unspecified: Secondary | ICD-10-CM

## 2015-06-09 LAB — CBC WITH DIFFERENTIAL/PLATELET
BASOS PCT: 0 % (ref 0–1)
Basophils Absolute: 0 10*3/uL (ref 0.0–0.1)
EOS ABS: 0.2 10*3/uL (ref 0.0–0.7)
Eosinophils Relative: 3 % (ref 0–5)
HCT: 30.9 % — ABNORMAL LOW (ref 36.0–46.0)
HEMOGLOBIN: 9.7 g/dL — AB (ref 12.0–15.0)
LYMPHS ABS: 2.5 10*3/uL (ref 0.7–4.0)
Lymphocytes Relative: 37 % (ref 12–46)
MCH: 23.4 pg — ABNORMAL LOW (ref 26.0–34.0)
MCHC: 31.4 g/dL (ref 30.0–36.0)
MCV: 74.6 fL — ABNORMAL LOW (ref 78.0–100.0)
MONOS PCT: 9 % (ref 3–12)
MPV: 9.4 fL (ref 8.6–12.4)
Monocytes Absolute: 0.6 10*3/uL (ref 0.1–1.0)
NEUTROS ABS: 3.4 10*3/uL (ref 1.7–7.7)
NEUTROS PCT: 51 % (ref 43–77)
PLATELETS: 323 10*3/uL (ref 150–400)
RBC: 4.14 MIL/uL (ref 3.87–5.11)
RDW: 15.8 % — ABNORMAL HIGH (ref 11.5–15.5)
WBC: 6.7 10*3/uL (ref 4.0–10.5)

## 2015-06-09 LAB — HEMOGLOBIN A1C
HEMOGLOBIN A1C: 5.9 % — AB (ref <5.7)
MEAN PLASMA GLUCOSE: 123 mg/dL — AB (ref <117)

## 2015-06-09 MED ORDER — LEVALBUTEROL TARTRATE 45 MCG/ACT IN AERO: 2.0000 | INHALATION_SPRAY | RESPIRATORY_TRACT | Status: DC | PRN

## 2015-06-09 NOTE — Progress Notes (Signed)
Complete Physical  Assessment and Plan: 1. Need for prophylactic vaccination and inoculation against influenza - Flu vaccine > 43yo with preservative IM (Fluvirin Influenza Split)  2. Essential hypertension - continue medications, DASH diet, exercise and monitor at home. Call if greater than 130/80.  - CBC with Differential/Platelet - BASIC METABOLIC PANEL WITH GFR - Hepatic function panel - TSH - Urinalysis, Routine w reflex microscopic (not at [facility]) - Microalbumin / creatinine urine ratio - EKG 12-Lead  3. Prediabetes Discussed general issues about diabetes pathophysiology and management., Educational material distributed., Suggested low cholesterol diet., Encouraged aerobic exercise., Discussed foot care., Reminded to get yearly retinal exam. - Hemoglobin A1c - Insulin, fasting  4. Obesity Obesity with co morbidities- long discussion about weight loss, diet, and exercise  5. Mixed hyperlipidemia -continue medications, check lipids, decrease fatty foods, increase activity.  - Lipid panel  6. Hypokalemia - BASIC METABOLIC PANEL WITH GFR  7. Iron deficiency anemia, unspecified Get back on iron - Iron and TIBC - Ferritin - Vitamin B12  8. Medication management - Magnesium  9. Vitamin D deficiency - Vit D  25 hydroxy (rtn osteoporosis monitoring)  10. Encounter for general adult medical examination with abnormal findings - CBC with Differential/Platelet - BASIC METABOLIC PANEL WITH GFR - Hepatic function panel - TSH - Lipid panel - Hemoglobin A1c - Insulin, fasting - Magnesium - Vit D  25 hydroxy (rtn osteoporosis monitoring) - Urinalysis, Routine w reflex microscopic (not at [facility]) - Microalbumin / creatinine urine ratio - Iron and TIBC - Ferritin - Vitamin B12 - EKG 12-Lead - Cytology - PAP  11. Asthma, unspecified asthma severity, uncomplicated Weight loss advised, dulera samples given, xopenex sent in  12. Screening for cervical cancer - Cytology -  PAP   Discussed med's effects and SE's. Screening labs and tests as requested with regular follow-up as recommended.  HPI 44 y.o. obese, pleasant AA female  presents for a complete physical and 3 month follow up.  She has had elevated blood pressure for at least 10 years. Her blood pressure has been controlled at home, today their BP is BP: 128/72 mmHg She does not workout, she works 12 hours 4 days a week and it is difficult for her to get motivated, and can do rotating shifts.  She denies chest pain, dizziness.  Has history of questionable asthma diagnosed 3-4 years ago without PFTs, no history when she was younger, had pneumonia in May 2016, has had pneumovax shot, does not have inhaler at this time. Has some SOB with walking fast with long distances, no wheezing, CP, coughing. Had normal echo in 2014.  She is on cholesterol medication and denies myalgias. Her cholesterol is at goal. The cholesterol last visit was:   Lab Results  Component Value Date   CHOL 134 12/02/2014   HDL 44* 12/02/2014   LDLCALC 71 12/02/2014   TRIG 97 12/02/2014   CHOLHDL 3.0 12/02/2014    She has been working on diet and exercise for diabetes, with MG and exercise has been in the preDM range, she is on an ACE and on metformin  once daily, and denies paresthesia of the feet, polydipsia and polyuria. Last A1C in the office was:  Lab Results  Component Value Date   HGBA1C 6.1* 12/02/2014   Patient is on Vitamin D supplement.   Lab Results  Component Value Date   VD25OH 21* 12/02/2014     She has seen cardio, GI, her OB/GYN and Dr. Darrold Span for anemia. Her  anemia has improved and she was on iron 1 pill daily with vitamin C however she has not been on it since June due to cost. She has had some feeling of fullness/heaviness with urinating x 2 months. Denies incontinence.  Lab Results  Component Value Date   WBC 4.8 12/25/2014   HGB 9.6* 12/25/2014   HCT 30.0* 12/25/2014   MCV 78.5 12/25/2014   PLT 283  12/25/2014   BMI is Body mass index is 38.28 kg/(m^2)., she is working on diet and exercise, she is down 11 lbs. She has been stressed with her mother being in the hospital, Chales AbrahamsPam Van[place] due to complications from MR replacement.   Wt Readings from Last 3 Encounters:  06/09/15 196 lb (88.905 kg)  12/29/14 202 lb 3.2 oz (91.717 kg)  12/02/14 207 lb (93.895 kg)    Current Medications:  Current Outpatient Prescriptions on File Prior to Visit  Medication Sig Dispense Refill  . benazepril (LOTENSIN) 20 MG tablet Take 1 tablet daily for BP & Kidney Protection 90 tablet 1  . cholecalciferol (VITAMIN D) 1000 UNITS tablet Take 1,000 Units by mouth daily.    Marland Kitchen. levalbuterol (XOPENEX HFA) 45 MCG/ACT inhaler Inhale 2 puffs into the lungs every 4 (four) hours as needed for wheezing. For shortness of breath 1 Inhaler 99  . metFORMIN (GLUCOPHAGE-XR) 500 MG 24 hr tablet Take 1 tablet (500 mg total) by mouth daily with breakfast. 90 tablet 3  . pravastatin (PRAVACHOL) 40 MG tablet TAKE 1 TABLET BY MOUTH EVERY DAY AT BEDTIME FOR CHOLESTEROL 90 tablet 1   No current facility-administered medications on file prior to visit.   Health Maintenance:   Immunization History  Administered Date(s) Administered  . Influenza Split 06/09/2015  . Pneumococcal-Unspecified 08/13/2007  . Td 11/10/2005   Tetanus: 2007 Pneumovax: 2009 Flu vaccine: 2016 Zostavax: N/A Pap: June 2014, never abnormal pap, will get LMP Oct 3rd, regular but heavy MGM: 06/2014, CAT B DEXA: N/A Colonoscopy: N/A EGD: N/A Echo 06/2013 CXR 2016  Patient Care Team: Lucky CowboyWilliam McKeown, MD as PCP - General (Internal Medicine) Shea EvansVaishali Mody, MD as Consulting Physician (Obstetrics and Gynecology) Louis Meckelobert D Kaplan, MD as Consulting Physician (Gastroenterology) Reece PackerLennis P Livesay, MD as Consulting Physician (Oncology) Kathleene Hazelhristopher D McAlhany, MD as Consulting Physician (Cardiology)  Allergies:  Allergies  Allergen Reactions  . Tussionex  Pennkinetic Er [Hydrocod Polst-Cpm Polst Er]     causes headache   Medical History:  Past Medical History  Diagnosis Date  . Hypertension   . High cholesterol   . Asthma   . DM (diabetes mellitus) (HCC)     borderline  . Low blood potassium   . Iron deficiency anemia   . Allergic rhinitis    Surgical History:  Past Surgical History  Procedure Laterality Date  . Cesarean section      x 4  . Umbilical hernia repair  1977  . Tubal ligation  1996   Family History:  Family History  Problem Relation Age of Onset  . Breast cancer Maternal Aunt   . Diabetes Mother   . Diabetes Maternal Aunt   . Heart disease Maternal Grandmother     great grandmother  . Stroke Mother   . CAD Neg Hx    Social History:  Social History  Substance Use Topics  . Smoking status: Never Smoker   . Smokeless tobacco: Never Used  . Alcohol Use: No   Review of Systems  Constitutional: Negative.   HENT: Negative.   Eyes: Negative.  Respiratory: Positive for shortness of breath. Negative for cough, hemoptysis, sputum production and wheezing.   Cardiovascular: Negative.  Negative for chest pain.  Gastrointestinal: Negative.   Genitourinary: Negative.   Musculoskeletal: Negative.   Skin: Negative.   Neurological: Negative.   Psychiatric/Behavioral: Negative.      Physical Exam: Estimated body mass index is 38.28 kg/(m^2) as calculated from the following:   Height as of this encounter: 5' (1.524 m).   Weight as of this encounter: 196 lb (88.905 kg). BP 128/72 mmHg  Pulse 62  Temp(Src) 98.1 F (36.7 C) (Temporal)  Resp 16  Ht 5' (1.524 m)  Wt 196 lb (88.905 kg)  BMI 38.28 kg/m2  SpO2 99%  LMP 05/15/2015 General Appearance: Well nourished, in no apparent distress. Eyes: PERRLA, EOMs, conjunctiva no swelling or erythema, normal fundi and vessels on left eye, some decrease visibility right eye Sinuses: No Frontal/maxillary tenderness ENT/Mouth: Ext aud canals clear, normal light reflex  with TMs without erythema, bulging.  Good dentition. No erythema, swelling, or exudate on post pharynx. Tonsils not swollen or erythematous. Hearing normal.  Neck: Supple, thyroid normal. No bruits Respiratory: Respiratory effort normal, BS equal bilaterally without rales, rhonchi, wheezing or stridor. Cardio: RRR without murmurs, rubs or gallops. Brisk peripheral pulses without edema.  Chest: symmetric, with normal excursions and percussion. Breasts: Symmetric, with several mobile round bumps, + dense breast,  without nipple discharge, retractions. Abdomen: Soft, +BS. Non tender, no guarding, rebound, hernias, masses, or organomegaly.  Lymphatics: Non tender without lymphadenopathy.  Genitourinary: normal external genitalia, vulva, vagina, cervix, uterus and adnexa, PAP: Pap smear done today. Musculoskeletal: Full ROM all peripheral extremities,5/5 strength, and normal gait. Skin: Warm, dry without rashes, lesions, ecchymosis. 2x70mm dark nevus mid upper back- unchanged Neuro: Cranial nerves intact, reflexes equal bilaterally. Normal muscle tone, no cerebellar symptoms. Sensation intact.  Psych: Awake and oriented X 3, normal affect, Insight and Judgment appropriate.   EKG: sinus bradycardia, asymptomatic, normal Echo, suggest getting sleep study   Quentin Mulling 9:42 AM

## 2015-06-10 LAB — BASIC METABOLIC PANEL WITH GFR
BUN: 7 mg/dL (ref 7–25)
CO2: 25 mmol/L (ref 20–31)
CREATININE: 0.59 mg/dL (ref 0.50–1.10)
Calcium: 9 mg/dL (ref 8.6–10.2)
Chloride: 102 mmol/L (ref 98–110)
GFR, Est African American: >89 mL/min (ref >=60)
GFR, Est Non African American: >89 mL/min (ref >=60)
Glucose, Bld: 90 mg/dL (ref 65–99)
POTASSIUM: 3.8 mmol/L (ref 3.5–5.3)
SODIUM: 136 mmol/L (ref 135–146)

## 2015-06-10 LAB — HEPATIC FUNCTION PANEL
ALK PHOS: 52 U/L (ref 33–115)
ALT: 20 U/L (ref 6–29)
AST: 18 U/L (ref 10–30)
Albumin: 3.9 g/dL (ref 3.6–5.1)
BILIRUBIN DIRECT: 0.1 mg/dL (ref <=0.2)
BILIRUBIN INDIRECT: 0.4 mg/dL (ref 0.2–1.2)
BILIRUBIN TOTAL: 0.5 mg/dL (ref 0.2–1.2)
Total Protein: 7.1 g/dL (ref 6.1–8.1)

## 2015-06-10 LAB — URINALYSIS, ROUTINE W REFLEX MICROSCOPIC
BILIRUBIN URINE: NEGATIVE
GLUCOSE, UA: NEGATIVE
Hgb urine dipstick: NEGATIVE
Ketones, ur: NEGATIVE
LEUKOCYTES UA: NEGATIVE
Nitrite: NEGATIVE
Protein, ur: NEGATIVE
SPECIFIC GRAVITY, URINE: 1.015 (ref 1.001–1.035)
pH: 7.5 (ref 5.0–8.0)

## 2015-06-10 LAB — LIPID PANEL
Cholesterol: 157 mg/dL (ref 125–200)
HDL: 47 mg/dL (ref >=46)
LDL CALC: 92 mg/dL (ref <130)
Total CHOL/HDL Ratio: 3.3 Ratio (ref <=5.0)
Triglycerides: 88 mg/dL (ref <150)
VLDL: 18 mg/dL (ref <30)

## 2015-06-10 LAB — TSH: TSH: 0.72 u[IU]/mL (ref 0.350–4.500)

## 2015-06-10 LAB — VITAMIN D 25 HYDROXY (VIT D DEFICIENCY, FRACTURES): VIT D 25 HYDROXY: 21 ng/mL — AB (ref 30–100)

## 2015-06-10 LAB — IRON AND TIBC
%SAT: 10 % — ABNORMAL LOW (ref 11–50)
IRON: 45 ug/dL (ref 40–190)
TIBC: 436 ug/dL (ref 250–450)
UIBC: 391 ug/dL (ref 125–400)

## 2015-06-10 LAB — INSULIN, FASTING: Insulin fasting, serum: 20.1 u[IU]/mL — ABNORMAL HIGH (ref 2.0–19.6)

## 2015-06-10 LAB — MICROALBUMIN / CREATININE URINE RATIO
Creatinine, Urine: 79 mg/dL (ref 20–320)
MICROALB/CREAT RATIO: 35 ug/mg{creat} — AB (ref <30)
Microalb, Ur: 2.8 mg/dL

## 2015-06-10 LAB — FERRITIN: Ferritin: 8 ng/mL — ABNORMAL LOW (ref 10–291)

## 2015-06-10 LAB — MAGNESIUM: Magnesium: 1.8 mg/dL (ref 1.5–2.5)

## 2015-06-10 LAB — VITAMIN B12: VITAMIN B 12: 558 pg/mL (ref 211–911)

## 2015-06-13 LAB — CYTOLOGY - PAP

## 2015-06-19 ENCOUNTER — Other Ambulatory Visit: Payer: Self-pay | Admitting: Physician Assistant

## 2015-06-19 MED ORDER — ALBUTEROL SULFATE HFA 108 (90 BASE) MCG/ACT IN AERS: 2.0000 | INHALATION_SPRAY | Freq: Four times a day (QID) | RESPIRATORY_TRACT | Status: DC | PRN

## 2015-06-30 ENCOUNTER — Encounter: Payer: Self-pay | Admitting: Physician Assistant

## 2015-06-30 ENCOUNTER — Other Ambulatory Visit: Payer: Self-pay | Admitting: Physician Assistant

## 2015-06-30 MED ORDER — METFORMIN HCL ER 500 MG PO TB24: 500.0000 mg | ORAL_TABLET | Freq: Two times a day (BID) | ORAL | Status: DC

## 2015-07-17 ENCOUNTER — Encounter: Payer: Self-pay | Admitting: Physician Assistant

## 2015-07-18 ENCOUNTER — Other Ambulatory Visit: Payer: Self-pay | Admitting: Physician Assistant

## 2015-07-18 MED ORDER — AMOXICILLIN-POT CLAVULANATE 875-125 MG PO TABS: 1.0000 | ORAL_TABLET | Freq: Two times a day (BID) | ORAL | Status: DC

## 2015-07-28 ENCOUNTER — Encounter: Payer: Self-pay | Admitting: Physician Assistant

## 2015-08-02 ENCOUNTER — Other Ambulatory Visit: Payer: Self-pay | Admitting: Internal Medicine

## 2015-09-12 ENCOUNTER — Ambulatory Visit (INDEPENDENT_AMBULATORY_CARE_PROVIDER_SITE_OTHER): Payer: BLUE CROSS/BLUE SHIELD | Admitting: Physician Assistant

## 2015-09-12 ENCOUNTER — Encounter: Payer: Self-pay | Admitting: Physician Assistant

## 2015-09-12 VITALS — BP 140/90 | HR 88 | Temp 97.9°F | Resp 16 | Ht 60.0 in | Wt 197.0 lb

## 2015-09-12 DIAGNOSIS — E559 Vitamin D deficiency, unspecified: Secondary | ICD-10-CM | POA: Diagnosis not present

## 2015-09-12 DIAGNOSIS — J4521 Mild intermittent asthma with (acute) exacerbation: Secondary | ICD-10-CM

## 2015-09-12 DIAGNOSIS — R7303 Prediabetes: Secondary | ICD-10-CM

## 2015-09-12 DIAGNOSIS — D509 Iron deficiency anemia, unspecified: Secondary | ICD-10-CM

## 2015-09-12 DIAGNOSIS — I10 Essential (primary) hypertension: Secondary | ICD-10-CM | POA: Diagnosis not present

## 2015-09-12 DIAGNOSIS — Z79899 Other long term (current) drug therapy: Secondary | ICD-10-CM | POA: Diagnosis not present

## 2015-09-12 DIAGNOSIS — E782 Mixed hyperlipidemia: Secondary | ICD-10-CM | POA: Diagnosis not present

## 2015-09-12 DIAGNOSIS — E669 Obesity, unspecified: Secondary | ICD-10-CM | POA: Diagnosis not present

## 2015-09-12 MED ORDER — MOMETASONE FURO-FORMOTEROL FUM 100-5 MCG/ACT IN AERO: 2.0000 | INHALATION_SPRAY | Freq: Two times a day (BID) | RESPIRATORY_TRACT | Status: DC

## 2015-09-12 MED ORDER — PROMETHAZINE-DM 6.25-15 MG/5ML PO SYRP: 5.0000 mL | ORAL_SOLUTION | Freq: Four times a day (QID) | ORAL | Status: DC | PRN

## 2015-09-12 MED ORDER — PREDNISONE 20 MG PO TABS: ORAL_TABLET | ORAL | Status: DC

## 2015-09-12 MED ORDER — AZITHROMYCIN 250 MG PO TABS: ORAL_TABLET | ORAL | Status: AC

## 2015-09-12 NOTE — Progress Notes (Signed)
Assessment and Plan:  1. Hypertension -Continue medication, monitor blood pressure at home. Continue DASH diet.  Reminder to go to the ER if any CP, SOB, nausea, dizziness, severe HA, changes vision/speech, left arm numbness and tingling and jaw pain.  2. Cholesterol -Continue diet and exercise. Check cholesterol.   3. Prediabetes  -Continue diet and exercise. Check A1C  4. Vitamin D Def - check level and continue medications.   5. Obesity Obesity with co morbidities- long discussion about weight loss, diet, and exercise  6. Iron deficiency anemia, unspecified Continue iron/vit C, check labs, may need referral GYN for fibroids  7. Asthma with acute exacerbation, mild intermittent Prednisone, zpak, dulera inhaler, continue xopenex, avoid triggers, call if worse   Continue diet and meds as discussed. Further disposition pending results of labs. Over 30 minutes of exam, counseling, chart review, and critical decision making was performed  HPI 45 y.o. female  presents for 3 month follow up on hypertension, cholesterol, prediabetes, and vitamin D deficiency.   Her blood pressure has been controlled at home, today their BP is BP: 140/90 mmHg  She does not workout. She denies chest pain, dizziness. She has asthma, she is just on xopenix, no maintenence inhaler, has been worse x 2 weeks. She is coughing worse at night, HA x 2 weeks, denies sinus congestion, mucus, fever, chills. Denies any known triggers for her asthma.   She is on cholesterol medication and denies myalgias. Her cholesterol is at goal. The cholesterol last visit was:   Lab Results  Component Value Date   CHOL 157 06/09/2015   HDL 47 06/09/2015   LDLCALC 92 06/09/2015   TRIG 88 06/09/2015   CHOLHDL 3.3 06/09/2015    She has been working on diet and exercise for prediabetes, she is on metformin for insulin resistance and denies paresthesia of the feet, polydipsia, polyuria and visual disturbances. Last A1C in the office  was:  Lab Results  Component Value Date   HGBA1C 5.9* 06/09/2015   Patient is on Vitamin D supplement.   Lab Results  Component Value Date   VD25OH 21* 06/09/2015     She has anemia, has been on iron with vit C, takes every other day. Still has heavy bleeding from fibroids x 5 days.   Lab Results  Component Value Date   WBC 6.7 06/09/2015   HGB 9.7* 06/09/2015   HCT 30.9* 06/09/2015   MCV 74.6* 06/09/2015   PLT 323 06/09/2015   BMI is Body mass index is 38.47 kg/(m^2)., she is working on diet and exercise. Wt Readings from Last 3 Encounters:  09/12/15 197 lb (89.359 kg)  06/09/15 196 lb (88.905 kg)  12/29/14 202 lb 3.2 oz (91.717 kg)     Current Medications:  Current Outpatient Prescriptions on File Prior to Visit  Medication Sig Dispense Refill  . albuterol (PROVENTIL HFA;VENTOLIN HFA) 108 (90 BASE) MCG/ACT inhaler Inhale 2 puffs into the lungs every 6 (six) hours as needed for wheezing or shortness of breath. 1 Inhaler 2  . amoxicillin-clavulanate (AUGMENTIN) 875-125 MG tablet Take 1 tablet by mouth 2 (two) times daily. 14 tablet 0  . benazepril (LOTENSIN) 20 MG tablet TAKE 1 TABLET BY MOUTH EVERY DAY FOR BLOOD PRESSURE AND KIDNEY PROTECTION 90 tablet 0  . cholecalciferol (VITAMIN D) 1000 UNITS tablet Take 1,000 Units by mouth daily.    Marland Kitchen levalbuterol (XOPENEX HFA) 45 MCG/ACT inhaler Inhale 2 puffs into the lungs every 4 (four) hours as needed for wheezing. For shortness  of breath 1 Inhaler 99  . metFORMIN (GLUCOPHAGE-XR) 500 MG 24 hr tablet Take 1 tablet (500 mg total) by mouth 2 (two) times daily with a meal. 90 tablet 3  . pravastatin (PRAVACHOL) 40 MG tablet TAKE 1 TABLET BY MOUTH EVERY DAY AT BEDTIME FOR CHOLESTEROL 90 tablet 1   No current facility-administered medications on file prior to visit.   Medical History:  Past Medical History  Diagnosis Date  . Hypertension   . High cholesterol   . Asthma   . DM (diabetes mellitus) (HCC)     borderline  . Low blood  potassium   . Iron deficiency anemia   . Allergic rhinitis    Allergies:  Allergies  Allergen Reactions  . Tussionex Pennkinetic Er [Hydrocod Polst-Cpm Polst Er]     causes headache     Review of Systems:  Review of Systems  Constitutional: Positive for malaise/fatigue. Negative for fever, chills, weight loss and diaphoresis.  HENT: Positive for congestion. Negative for ear discharge, ear pain, hearing loss, nosebleeds, sore throat and tinnitus.   Eyes: Negative.   Respiratory: Positive for cough, shortness of breath and wheezing. Negative for hemoptysis, sputum production and stridor.   Cardiovascular: Negative.  Negative for chest pain.  Gastrointestinal: Negative.   Genitourinary: Negative.   Musculoskeletal: Negative.   Skin: Negative.   Neurological: Negative.  Negative for weakness and headaches.  Psychiatric/Behavioral: Negative.     Family history- Review and unchanged Social history- Review and unchanged Physical Exam: BP 140/90 mmHg  Pulse 88  Temp(Src) 97.9 F (36.6 C) (Temporal)  Resp 16  Ht 5' (1.524 m)  Wt 197 lb (89.359 kg)  BMI 38.47 kg/m2  SpO2 98%  LMP 09/10/2015 Wt Readings from Last 3 Encounters:  09/12/15 197 lb (89.359 kg)  06/09/15 196 lb (88.905 kg)  12/29/14 202 lb 3.2 oz (91.717 kg)   General Appearance: Well nourished, in no apparent distress. Eyes: PERRLA, EOMs, conjunctiva no swelling or erythema Sinuses: No Frontal/maxillary tenderness ENT/Mouth: Ext aud canals clear, TMs without erythema, bulging. No erythema, swelling, or exudate on post pharynx.  Tonsils not swollen or erythematous. Hearing normal.  Neck: Supple, thyroid normal.  Respiratory: Respiratory effort normal, decreased breath sounds bilaterally with mild expiratory wheeze without rales, rhonchi, or stridor.  Cardio: RRR with no MRGs. Brisk peripheral pulses without edema.  Abdomen: Soft, + BS,  Non tender, no guarding, rebound, hernias, masses. Lymphatics: Non tender  without lymphadenopathy.  Musculoskeletal: Full ROM, 5/5 strength, Normal gait Skin: Warm, dry without rashes, lesions, ecchymosis.  Neuro: Cranial nerves intact. Normal muscle tone, no cerebellar symptoms. Psych: Awake and oriented X 3, normal affect, Insight and Judgment appropriate.    Quentin Mulling, PA-C 3:48 PM [facility]

## 2015-09-12 NOTE — Patient Instructions (Signed)
Asthma, Acute Bronchospasm Acute bronchospasm caused by asthma is also referred to as an asthma attack. Bronchospasm means your air passages become narrowed. The narrowing is caused by inflammation and tightening of the muscles in the air tubes (bronchi) in your lungs. This can make it hard to breathe or cause you to wheeze and cough. CAUSES Possible triggers are:  Animal dander from the skin, hair, or feathers of animals.  Dust mites contained in house dust.  Cockroaches.  Pollen from trees or grass.  Mold.  Cigarette or tobacco smoke.  Air pollutants such as dust, household cleaners, hair sprays, aerosol sprays, paint fumes, strong chemicals, or strong odors.  Cold air or weather changes. Cold air may trigger inflammation. Winds increase molds and pollens in the air.  Strong emotions such as crying or laughing hard.  Stress.  Certain medicines such as aspirin or beta-blockers.  Sulfites in foods and drinks, such as dried fruits and wine.  Infections or inflammatory conditions, such as a flu, cold, or inflammation of the nasal membranes (rhinitis).  Gastroesophageal reflux disease (GERD). GERD is a condition where stomach acid backs up into your esophagus.  Exercise or strenuous activity. SIGNS AND SYMPTOMS   Wheezing.  Excessive coughing, particularly at night.  Chest tightness.  Shortness of breath. DIAGNOSIS  Your health care provider will ask you about your medical history and perform a physical exam. A chest X-ray or blood testing may be performed to look for other causes of your symptoms or other conditions that may have triggered your asthma attack. TREATMENT  Treatment is aimed at reducing inflammation and opening up the airways in your lungs. Most asthma attacks are treated with inhaled medicines. These include quick relief or rescue medicines (such as bronchodilators) and controller medicines (such as inhaled corticosteroids). These medicines are sometimes  given through an inhaler or a nebulizer. Systemic steroid medicine taken by mouth or given through an IV tube also can be used to reduce the inflammation when an attack is moderate or severe. Antibiotic medicines are only used if a bacterial infection is present.  HOME CARE INSTRUCTIONS   Rest.  Drink plenty of liquids. This helps the mucus to remain thin and be easily coughed up. Only use caffeine in moderation and do not use alcohol until you have recovered from your illness.  Do not smoke. Avoid being exposed to secondhand smoke.  You play a critical role in keeping yourself in good health. Avoid exposure to things that cause you to wheeze or to have breathing problems.  Keep your medicines up-to-date and available. Carefully follow your health care provider's treatment plan.  Take your medicine exactly as prescribed.  When pollen or pollution is bad, keep windows closed and use an air conditioner or go to places with air conditioning.  Asthma requires careful medical care. See your health care provider for a follow-up as advised. If you are more than [redacted] weeks pregnant and you were prescribed any new medicines, let your obstetrician know about the visit and how you are doing. Follow up with your health care provider as directed.  After you have recovered from your asthma attack, make an appointment with your outpatient doctor to talk about ways to reduce the likelihood of future attacks. If you do not have a doctor who manages your asthma, make an appointment with a primary care doctor to discuss your asthma. SEEK IMMEDIATE MEDICAL CARE IF:   You are getting worse.  You have trouble breathing. If severe, call your local   emergency services (911 in the [place]  You develop chest pain or discomfort.  You are vomiting.  You are not able to keep fluids down.  You are coughing up yellow, green, brown, or bloody sputum.  You have a fever and your symptoms suddenly get worse.  You have  trouble swallowing. MAKE SURE YOU:   Understand these instructions.  Will watch your condition.  Will get help right away if you are not doing well or get worse.   This information is not intended to replace advice given to you by your health care provider. Make sure you discuss any questions you have with your health care provider.   Document Released: 11/13/2006 Document Revised: 08/03/2013 Document Reviewed: 02/03/2013 Elsevier Interactive Patient Education 2016 [employer]. Iron Deficiency Anemia, Adult Anemia is a condition in which there are less red blood cells or hemoglobin in the blood than normal. Hemoglobin is the part of red blood cells that carries oxygen. Iron deficiency anemia is anemia caused by too little iron. It is the most common type of anemia. It may leave you tired and short of breath. CAUSES   Lack of iron in the diet.  Poor absorption of iron, as seen with intestinal disorders.  Intestinal bleeding.  Heavy periods. SIGNS AND SYMPTOMS  Mild anemia may not be noticeable. Symptoms may include:  Fatigue.  Headache.  Pale skin.  Weakness.  Tiredness.  Shortness of breath.  Dizziness.  Cold hands and feet.  Fast or irregular heartbeat. DIAGNOSIS  Diagnosis requires a thorough evaluation and physical exam by your health care provider. Blood tests are generally used to confirm iron deficiency anemia. Additional tests may be done to find the underlying cause of your anemia. These may include:  Testing for blood in the stool (fecal occult blood test).  A procedure to see inside the colon and rectum (colonoscopy).  A procedure to see inside the esophagus and stomach (endoscopy). TREATMENT  Iron deficiency anemia is treated by correcting the cause of the deficiency. Treatment may involve:  Adding iron-rich foods to your diet.  Taking iron supplements. Pregnant or breastfeeding women need to take extra iron because their normal diet usually does  not provide the required amount.  Taking vitamins. Vitamin C improves the absorption of iron. Your health care provider may recommend that you take your iron tablets with a glass of orange juice or vitamin C supplement.  Medicines to make heavy menstrual flow lighter.  Surgery. HOME CARE INSTRUCTIONS   Take iron as directed by your health care provider.  If you cannot tolerate taking iron supplements by mouth, talk to your health care provider about taking them through a vein (intravenously) or an injection into a muscle.  For the best iron absorption, iron supplements should be taken on an empty stomach. If you cannot tolerate them on an empty stomach, you may need to take them with food.  Do not drink milk or take antacids at the same time as your iron supplements. Milk and antacids may interfere with the absorption of iron.  Iron supplements can cause constipation. Make sure to include fiber in your diet to prevent constipation. A stool softener may also be recommended.  Take vitamins as directed by your health care provider.  Eat a diet rich in iron. Foods high in iron include liver, lean beef, whole-grain bread, eggs, dried fruit, and dark green leafy vegetables. SEEK IMMEDIATE MEDICAL CARE IF:   You faint. If this happens, do not drive. Call your local  emergency services (911 in [place] if no other help is available.  You have chest pain.  You feel nauseous or vomit.  You have severe or increased shortness of breath with activity.  You feel weak.  You have a rapid heartbeat.  You have unexplained sweating.  You become light-headed when getting up from a chair or bed. MAKE SURE YOU:   Understand these instructions.  Will watch your condition.  Will get help right away if you are not doing well or get worse.   This information is not intended to replace advice given to you by your health care provider. Make sure you discuss any questions you have with your health care  provider.   Document Released: 07/26/2000 Document Revised: 08/19/2014 Document Reviewed: 04/05/2013 Elsevier Interactive Patient Education [employer]

## 2015-09-13 ENCOUNTER — Encounter: Payer: Self-pay | Admitting: Physician Assistant

## 2015-09-13 LAB — LIPID PANEL
Cholesterol: 157 mg/dL (ref 125–200)
HDL: 37 mg/dL — ABNORMAL LOW (ref >=46)
LDL CALC: 98 mg/dL (ref <130)
TRIGLYCERIDES: 108 mg/dL (ref <150)
Total CHOL/HDL Ratio: 4.2 Ratio (ref <=5.0)
VLDL: 22 mg/dL (ref <30)

## 2015-09-13 LAB — HEPATIC FUNCTION PANEL
ALT: 16 U/L (ref 6–29)
AST: 16 U/L (ref 10–30)
Albumin: 3.5 g/dL — ABNORMAL LOW (ref 3.6–5.1)
Alkaline Phosphatase: 54 U/L (ref 33–115)
BILIRUBIN INDIRECT: 0.2 mg/dL (ref 0.2–1.2)
Bilirubin, Direct: 0.1 mg/dL (ref <=0.2)
Total Bilirubin: 0.3 mg/dL (ref 0.2–1.2)
Total Protein: 7.1 g/dL (ref 6.1–8.1)

## 2015-09-13 LAB — FOLATE RBC: RBC FOLATE: 522 ng/mL (ref >280)

## 2015-09-13 LAB — BASIC METABOLIC PANEL WITH GFR
BUN: 14 mg/dL (ref 7–25)
CO2: 25 mmol/L (ref 20–31)
Calcium: 9.3 mg/dL (ref 8.6–10.2)
Chloride: 103 mmol/L (ref 98–110)
Creat: 0.66 mg/dL (ref 0.50–1.10)
GFR, Est African American: >89 mL/min (ref >=60)
Glucose, Bld: 100 mg/dL — ABNORMAL HIGH (ref 65–99)
Potassium: 3.5 mmol/L (ref 3.5–5.3)
Sodium: 138 mmol/L (ref 135–146)

## 2015-09-13 LAB — CBC WITH DIFFERENTIAL/PLATELET
BASOS ABS: 0 10*3/uL (ref 0.0–0.1)
Basophils Relative: 0 % (ref 0–1)
Eosinophils Absolute: 0.2 10*3/uL (ref 0.0–0.7)
Eosinophils Relative: 4 % (ref 0–5)
HEMATOCRIT: 30.8 % — AB (ref 36.0–46.0)
HEMOGLOBIN: 9.7 g/dL — AB (ref 12.0–15.0)
LYMPHS PCT: 38 % (ref 12–46)
Lymphs Abs: 2.2 10*3/uL (ref 0.7–4.0)
MCH: 25.1 pg — ABNORMAL LOW (ref 26.0–34.0)
MCHC: 31.5 g/dL (ref 30.0–36.0)
MCV: 79.8 fL (ref 78.0–100.0)
MONO ABS: 0.4 10*3/uL (ref 0.1–1.0)
MPV: 9.6 fL (ref 8.6–12.4)
Monocytes Relative: 7 % (ref 3–12)
NEUTROS ABS: 2.9 10*3/uL (ref 1.7–7.7)
Neutrophils Relative %: 51 % (ref 43–77)
Platelets: 279 10*3/uL (ref 150–400)
RBC: 3.86 MIL/uL — AB (ref 3.87–5.11)
RDW: 15.1 % (ref 11.5–15.5)
WBC: 5.7 10*3/uL (ref 4.0–10.5)

## 2015-09-13 LAB — IRON AND TIBC
%SAT: 11 % (ref 11–50)
Iron: 43 ug/dL (ref 40–190)
TIBC: 394 ug/dL (ref 250–450)
UIBC: 351 ug/dL (ref 125–400)

## 2015-09-13 LAB — TSH: TSH: 0.85 u[IU]/mL (ref 0.350–4.500)

## 2015-09-13 LAB — MAGNESIUM: MAGNESIUM: 1.8 mg/dL (ref 1.5–2.5)

## 2015-09-13 LAB — FERRITIN: Ferritin: 12 ng/mL (ref 10–291)

## 2015-09-13 LAB — VITAMIN D 25 HYDROXY (VIT D DEFICIENCY, FRACTURES): Vit D, 25-Hydroxy: 22 ng/mL — ABNORMAL LOW (ref 30–100)

## 2015-09-13 LAB — HEMOGLOBIN A1C
Hgb A1c MFr Bld: 5.8 % — ABNORMAL HIGH (ref <5.7)
MEAN PLASMA GLUCOSE: 120 mg/dL — AB (ref <117)

## 2015-09-14 ENCOUNTER — Other Ambulatory Visit: Payer: Self-pay | Admitting: Internal Medicine

## 2015-09-14 DIAGNOSIS — J4541 Moderate persistent asthma with (acute) exacerbation: Secondary | ICD-10-CM

## 2015-09-15 ENCOUNTER — Other Ambulatory Visit: Payer: Self-pay | Admitting: Internal Medicine

## 2015-09-15 ENCOUNTER — Encounter: Payer: Self-pay | Admitting: Internal Medicine

## 2015-09-15 ENCOUNTER — Ambulatory Visit: Payer: Self-pay | Admitting: Physician Assistant

## 2015-09-15 DIAGNOSIS — J45909 Unspecified asthma, uncomplicated: Secondary | ICD-10-CM

## 2015-09-15 MED ORDER — ALBUTEROL SULFATE (2.5 MG/3ML) 0.083% IN NEBU: 2.5000 mg | INHALATION_SOLUTION | Freq: Four times a day (QID) | RESPIRATORY_TRACT | Status: DC | PRN

## 2015-09-21 ENCOUNTER — Other Ambulatory Visit: Payer: Self-pay

## 2015-09-22 ENCOUNTER — Encounter: Payer: Self-pay | Admitting: Physician Assistant

## 2015-09-22 ENCOUNTER — Ambulatory Visit (INDEPENDENT_AMBULATORY_CARE_PROVIDER_SITE_OTHER): Payer: BLUE CROSS/BLUE SHIELD | Admitting: Physician Assistant

## 2015-09-22 VITALS — BP 150/100 | HR 77 | Temp 97.3°F | Resp 16 | Ht 60.0 in | Wt 200.6 lb

## 2015-09-22 DIAGNOSIS — D509 Iron deficiency anemia, unspecified: Secondary | ICD-10-CM

## 2015-09-22 DIAGNOSIS — E669 Obesity, unspecified: Secondary | ICD-10-CM

## 2015-09-22 DIAGNOSIS — J4521 Mild intermittent asthma with (acute) exacerbation: Secondary | ICD-10-CM

## 2015-09-22 MED ORDER — FUSION PLUS PO CAPS: ORAL_CAPSULE | ORAL | Status: DC

## 2015-09-22 NOTE — Progress Notes (Signed)
Assessment and Plan: Asthma- dulera samples given, continue on it Anemia due to fibroids- fusion plus, suggest call GYN for possible options.   Future Appointments Date Time Provider Department Center  06/10/2016 10:00 AM Courtney Forcucci, PA-C [id number] None    HPI 45 y.o.female presents for asthma flare follow up and anemia.  She states that her breathing is better, she is still on dulera daily and uses albuterol everyother day.  She has anemia due to fibroid/menorrhagia, will contact gyn for possible options due to fatigue,SOB, will give fusion samples.   Past Medical History  Diagnosis Date  . Hypertension   . High cholesterol   . Asthma   . DM (diabetes mellitus) (HCC)     borderline  . Low blood potassium   . Iron deficiency anemia   . Allergic rhinitis      Allergies  Allergen Reactions  . Tussionex Pennkinetic Er [Hydrocod Polst-Cpm Polst Er]     causes headache      Current Outpatient Prescriptions on File Prior to Visit  Medication Sig Dispense Refill  . albuterol (PROVENTIL HFA;VENTOLIN HFA) 108 (90 BASE) MCG/ACT inhaler Inhale 2 puffs into the lungs every 6 (six) hours as needed for wheezing or shortness of breath. 1 Inhaler 2  . albuterol (PROVENTIL) (2.5 MG/3ML) 0.083% nebulizer solution Take 3 mLs (2.5 mg total) by nebulization every 6 (six) hours as needed for wheezing or shortness of breath. 75 mL 12  . benazepril (LOTENSIN) 20 MG tablet TAKE 1 TABLET BY MOUTH EVERY DAY FOR BLOOD PRESSURE AND KIDNEY PROTECTION 90 tablet 0  . cholecalciferol (VITAMIN D) 1000 UNITS tablet Take 1,000 Units by mouth daily.    Marland Kitchen levalbuterol (XOPENEX HFA) 45 MCG/ACT inhaler Inhale 2 puffs into the lungs every 4 (four) hours as needed for wheezing. For shortness of breath 1 Inhaler 99  . metFORMIN (GLUCOPHAGE-XR) 500 MG 24 hr tablet Take 1 tablet (500 mg total) by mouth 2 (two) times daily with a meal. 90 tablet 3  . mometasone-formoterol (DULERA) 100-5 MCG/ACT AERO Inhale 2  puffs into the lungs 2 (two) times daily. 1 Inhaler 0  . pravastatin (PRAVACHOL) 40 MG tablet TAKE 1 TABLET BY MOUTH EVERY DAY AT BEDTIME FOR CHOLESTEROL 90 tablet 1  . amoxicillin-clavulanate (AUGMENTIN) 875-125 MG tablet Take 1 tablet by mouth 2 (two) times daily. (Patient not taking: Reported on 09/22/2015) 14 tablet 0  . azithromycin (ZITHROMAX) 250 MG tablet Reported on 09/22/2015  1  . predniSONE (DELTASONE) 20 MG tablet 2 tablets daily for 3 days, 1 tablet daily for 4 days. (Patient not taking: Reported on 09/22/2015) 10 tablet 0  . promethazine-dextromethorphan (PROMETHAZINE-DM) 6.25-15 MG/5ML syrup Take 5 mLs by mouth 4 (four) times daily as needed. (Patient not taking: Reported on 09/22/2015) 240 mL 0   No current facility-administered medications on file prior to visit.    ROS: all negative except above.   Physical Exam: Filed Weights   09/22/15 0904  Weight: 200 lb 9.6 oz (90.992 kg)   BP 150/100 mmHg  Pulse 77  Temp(Src) 97.3 F (36.3 C)  Resp 16  Ht 5' (1.524 m)  Wt 200 lb 9.6 oz (90.992 kg)  BMI 39.18 kg/m2  SpO2 98%  LMP 09/10/2015 General Appearance: Well nourished, in no apparent distress. Eyes: PERRLA, EOMs, conjunctiva no swelling or erythema Sinuses: No Frontal/maxillary tenderness ENT/Mouth: Ext aud canals clear, TMs without erythema, bulging. No erythema, swelling, or exudate on post pharynx.  Tonsils not swollen or erythematous. Hearing normal.  Neck:  Supple, thyroid normal.  Respiratory: Respiratory effort normal, BS equal bilaterally without rales, rhonchi, wheezing or stridor.  Cardio: RRR with no MRGs. Brisk peripheral pulses without edema.  Abdomen: Soft, + BS.  Non tender, no guarding, rebound, hernias, masses. Lymphatics: Non tender without lymphadenopathy.  Musculoskeletal: Full ROM, 5/5 strength, normal gait.  Skin: Warm, dry without rashes, lesions, ecchymosis.  Neuro: Cranial nerves intact. Normal muscle tone, no cerebellar symptoms. Sensation  intact.  Psych: Awake and oriented X 3, normal affect, Insight and Judgment appropriate.     Quentin Mulling, PA-C 9:23 AM [facility]

## 2015-09-22 NOTE — Patient Instructions (Signed)
Anemia, Nonspecific Anemia is a condition in which the concentration of red blood cells or hemoglobin in the blood is below normal. Hemoglobin is a substance in red blood cells that carries oxygen to the tissues of the body. Anemia results in not enough oxygen reaching these tissues.  CAUSES  Common causes of anemia include:   Excessive bleeding. Bleeding may be internal or external. This includes excessive bleeding from periods (in women) or from the intestine.   Poor nutrition.   Chronic kidney, thyroid, and liver disease.  Bone marrow disorders that decrease red blood cell production.  Cancer and treatments for cancer.  HIV, AIDS, and their treatments.  Spleen problems that increase red blood cell destruction.  Blood disorders.  Excess destruction of red blood cells due to infection, medicines, and autoimmune disorders. SIGNS AND SYMPTOMS   Minor weakness.   Dizziness.   Headache.  Palpitations.   Shortness of breath, especially with exercise.   Paleness.  Cold sensitivity.  Indigestion.  Nausea.  Difficulty sleeping.  Difficulty concentrating. Symptoms may occur suddenly or they may develop slowly.  DIAGNOSIS  Additional blood tests are often needed. These help your health care provider determine the best treatment. Your health care provider will check your stool for blood and look for other causes of blood loss.  TREATMENT  Treatment varies depending on the cause of the anemia. Treatment can include:   Supplements of iron, vitamin B12, or folic acid.   Hormone medicines.   A blood transfusion. This may be needed if blood loss is severe.   Hospitalization. This may be needed if there is significant continual blood loss.   Dietary changes.  Spleen removal. HOME CARE INSTRUCTIONS Keep all follow-up appointments. It often takes many weeks to correct anemia, and having your health care provider check on your condition and your response to  treatment is very important. SEEK IMMEDIATE MEDICAL CARE IF:   You develop extreme weakness, shortness of breath, or chest pain.   You become dizzy or have trouble concentrating.  You develop heavy vaginal bleeding.   You develop a rash.   You have bloody or black, tarry stools.   You faint.   You vomit up blood.   You vomit repeatedly.   You have abdominal pain.  You have a fever or persistent symptoms for more than 2-3 days.   You have a fever and your symptoms suddenly get worse.   You are dehydrated.  MAKE SURE YOU:  Understand these instructions.  Will watch your condition.  Will get help right away if you are not doing well or get worse.   This information is not intended to replace advice given to you by your health care provider. Make sure you discuss any questions you have with your health care provider.   Document Released: 09/05/2004 Document Revised: 03/31/2013 Document Reviewed: 01/22/2013 Elsevier Interactive Patient Education 2016 Elsevier Inc.  

## 2015-10-17 ENCOUNTER — Encounter: Payer: Self-pay | Admitting: Physician Assistant

## 2015-10-17 ENCOUNTER — Other Ambulatory Visit: Payer: Self-pay | Admitting: Physician Assistant

## 2015-10-17 MED ORDER — BENAZEPRIL HCL 40 MG PO TABS: 40.0000 mg | ORAL_TABLET | Freq: Every day | ORAL | Status: DC

## 2015-11-15 ENCOUNTER — Other Ambulatory Visit: Payer: Self-pay | Admitting: Internal Medicine

## 2015-11-16 ENCOUNTER — Encounter: Payer: Self-pay | Admitting: Internal Medicine

## 2015-11-16 ENCOUNTER — Ambulatory Visit (INDEPENDENT_AMBULATORY_CARE_PROVIDER_SITE_OTHER): Payer: BLUE CROSS/BLUE SHIELD | Admitting: Internal Medicine

## 2015-11-16 VITALS — BP 164/92 | HR 94 | Temp 98.4°F | Resp 18 | Ht 60.0 in | Wt 192.0 lb

## 2015-11-16 DIAGNOSIS — J069 Acute upper respiratory infection, unspecified: Secondary | ICD-10-CM | POA: Diagnosis not present

## 2015-11-16 MED ORDER — PROMETHAZINE-DM 6.25-15 MG/5ML PO SYRP: 5.0000 mL | ORAL_SOLUTION | Freq: Four times a day (QID) | ORAL | Status: DC | PRN

## 2015-11-16 MED ORDER — FLUTICASONE PROPIONATE 50 MCG/ACT NA SUSP: 2.0000 | Freq: Every day | NASAL | Status: DC

## 2015-11-16 MED ORDER — PREDNISONE 20 MG PO TABS: ORAL_TABLET | ORAL | Status: DC

## 2015-11-16 NOTE — Progress Notes (Signed)
HPI  Patient presents to the office for evaluation of cough.  It has been going on for 1 days.  Patient reports night > day, dry, worse with lying down.  They also endorse postnasal drip and rhinorrhea clear, sinus pressure, headache, scratchy throat.  .  They have tried theraflu and started taking a zpak this morning.  They report that nothing has worked.  They denies other sick contacts.  Review of Systems  Constitutional: Negative for fever, chills and malaise/fatigue.  HENT: Positive for congestion. Negative for ear pain and sore throat.   Respiratory: Positive for cough. Negative for shortness of breath and wheezing.   Cardiovascular: Negative for chest pain, palpitations and leg swelling.  Neurological: Positive for headaches.    PE:  Filed Vitals:   11/16/15 1409  BP: 164/92  Pulse: 94  Temp: 98.4 F (36.9 C)  Resp: 18    General:  Alert and non-toxic, WDWN, NAD HEENT: NCAT, PERLA, EOM normal, no occular discharge or erythema.  Nasal mucosal edema with sinus tenderness to palpation.  Oropharynx clear with minimal oropharyngeal edema and erythema.  Mucous membranes moist and pink. Neck:  Cervical adenopathy Chest:  RRR no MRGs.  Lungs clear to auscultation A&P with no wheezes rhonchi or rales.   Abdomen: +BS x 4 quadrants, soft, non-tender, no guarding, rigidity, or rebound. Skin: warm and dry no rash Neuro: A&Ox4, CN II-XII grossly intact  Assessment and Plan:   1. Acute URI -nasal saline -zyrtec - predniSONE (DELTASONE) 20 MG tablet; 3 tabs po day one, then 2 tabs daily x 4 days  Dispense: 11 tablet; Refill: 0 - fluticasone (FLONASE) 50 MCG/ACT nasal spray; Place 2 sprays into both nostrils daily.  Dispense: 16 g; Refill: 2 - promethazine-dextromethorphan (PROMETHAZINE-DM) 6.25-15 MG/5ML syrup; Take 5 mLs by mouth 4 (four) times daily as needed for cough.  Dispense: 473 mL; Refill: 0

## 2015-11-22 ENCOUNTER — Encounter: Payer: Self-pay | Admitting: Internal Medicine

## 2015-12-22 ENCOUNTER — Ambulatory Visit: Payer: Self-pay | Admitting: Internal Medicine

## 2015-12-25 ENCOUNTER — Other Ambulatory Visit: Payer: Self-pay | Admitting: Internal Medicine

## 2015-12-25 DIAGNOSIS — E785 Hyperlipidemia, unspecified: Secondary | ICD-10-CM

## 2015-12-27 ENCOUNTER — Encounter: Payer: Self-pay | Admitting: Internal Medicine

## 2015-12-27 ENCOUNTER — Emergency Department (HOSPITAL_COMMUNITY)
Admission: EM | Admit: 2015-12-27 | Discharge: 2015-12-27 | Disposition: A | Discharge status: Home / Self Care | Payer: BLUE CROSS/BLUE SHIELD | Attending: Emergency Medicine | Admitting: Emergency Medicine

## 2015-12-27 ENCOUNTER — Emergency Department (HOSPITAL_COMMUNITY): Payer: BLUE CROSS/BLUE SHIELD

## 2015-12-27 ENCOUNTER — Other Ambulatory Visit: Payer: Self-pay | Admitting: Internal Medicine

## 2015-12-27 ENCOUNTER — Encounter (HOSPITAL_COMMUNITY): Payer: Self-pay | Admitting: Emergency Medicine

## 2015-12-27 ENCOUNTER — Ambulatory Visit (INDEPENDENT_AMBULATORY_CARE_PROVIDER_SITE_OTHER): Payer: BLUE CROSS/BLUE SHIELD | Admitting: Internal Medicine

## 2015-12-27 VITALS — BP 200/110 | HR 72 | Temp 97.7°F | Resp 16 | Ht 60.0 in | Wt 198.2 lb

## 2015-12-27 DIAGNOSIS — I169 Hypertensive crisis, unspecified: Secondary | ICD-10-CM

## 2015-12-27 DIAGNOSIS — E7800 Pure hypercholesterolemia, unspecified: Secondary | ICD-10-CM | POA: Diagnosis not present

## 2015-12-27 DIAGNOSIS — J45909 Unspecified asthma, uncomplicated: Secondary | ICD-10-CM | POA: Insufficient documentation

## 2015-12-27 DIAGNOSIS — R0602 Shortness of breath: Secondary | ICD-10-CM | POA: Diagnosis not present

## 2015-12-27 DIAGNOSIS — Z7982 Long term (current) use of aspirin: Secondary | ICD-10-CM | POA: Diagnosis not present

## 2015-12-27 DIAGNOSIS — I10 Essential (primary) hypertension: Secondary | ICD-10-CM | POA: Diagnosis not present

## 2015-12-27 LAB — CBC WITH DIFFERENTIAL/PLATELET
BASOS ABS: 0 10*3/uL (ref 0.0–0.1)
BASOS PCT: 0 %
EOS ABS: 0.2 10*3/uL (ref 0.0–0.7)
Eosinophils Relative: 3 %
HCT: 33.1 % — ABNORMAL LOW (ref 36.0–46.0)
HEMOGLOBIN: 11.1 g/dL — AB (ref 12.0–15.0)
Lymphocytes Relative: 37 %
Lymphs Abs: 2.5 10*3/uL (ref 0.7–4.0)
MCH: 26 pg (ref 26.0–34.0)
MCHC: 33.5 g/dL (ref 30.0–36.0)
MCV: 77.5 fL — ABNORMAL LOW (ref 78.0–100.0)
Monocytes Absolute: 0.5 10*3/uL (ref 0.1–1.0)
Monocytes Relative: 7 %
NEUTROS PCT: 53 %
Neutro Abs: 3.5 10*3/uL (ref 1.7–7.7)
Platelets: 273 10*3/uL (ref 150–400)
RBC: 4.27 MIL/uL (ref 3.87–5.11)
RDW: 13.5 % (ref 11.5–15.5)
WBC: 6.7 10*3/uL (ref 4.0–10.5)

## 2015-12-27 LAB — BASIC METABOLIC PANEL
ANION GAP: 6 (ref 5–15)
BUN: 13 mg/dL (ref 6–20)
CO2: 25 mmol/L (ref 22–32)
Calcium: 9 mg/dL (ref 8.9–10.3)
Chloride: 105 mmol/L (ref 101–111)
Creatinine, Ser: 0.74 mg/dL (ref 0.44–1.00)
GFR calc Af Amer: >60 mL/min (ref >60)
GLUCOSE: 96 mg/dL (ref 65–99)
POTASSIUM: 3.3 mmol/L — AB (ref 3.5–5.1)
SODIUM: 136 mmol/L (ref 135–145)

## 2015-12-27 LAB — I-STAT TROPONIN, ED: TROPONIN I, POC: 0.02 ng/mL (ref 0.00–0.08)

## 2015-12-27 MED ORDER — BENAZEPRIL HCL 40 MG PO TABS
40.0000 mg | ORAL_TABLET | Freq: Every day | ORAL | Status: DC
  Administered 2015-12-27: 40 mg via ORAL
  Filled 2015-12-27: qty 1

## 2015-12-27 MED ORDER — POTASSIUM CHLORIDE CRYS ER 20 MEQ PO TBCR
20.0000 meq | EXTENDED_RELEASE_TABLET | Freq: Once | ORAL | Status: AC
  Administered 2015-12-27: 20 meq via ORAL
  Filled 2015-12-27: qty 1

## 2015-12-27 NOTE — ED Notes (Signed)
Ambulated to the bathroom no difficulty.

## 2015-12-27 NOTE — ED Provider Notes (Signed)
CSN: [id number]     Arrival date & time 12/27/15  1455 History   First MD Initiated Contact with Patient 12/27/15 1734     Chief Complaint  Patient presents with  . hypertension/sent from PCP      (Consider location/radiation/quality/duration/timing/severity/associated sxs/prior Treatment) The history is provided by the patient.   Alexis Marquez is a 45 y.o. female with a history outlined below, most significant for hypertension, was seen by her PCP this afternoon and had an extremely elevated blood pressure at 185/125 and was sent directly here for further evaluation.  She is currently treated with Lotensin 40 mg twice a day and has not missed any doses.  She has also been on HCTZ in the past but this adversely affected her potassium level so was discontinued.  She denies headache, dizziness, visual changes, chest pain, nausea, vomiting or peripheral edema.  She does endorse occasional shortness of breath, but equates this with her history of asthma.     Past Medical History  Diagnosis Date  . Hypertension   . High cholesterol   . Asthma   . DM (diabetes mellitus) (HCC)     borderline  . Low blood potassium   . Iron deficiency anemia   . Allergic rhinitis    Past Surgical History  Procedure Laterality Date  . Cesarean section      x 4  . Umbilical hernia repair  1977  . Tubal ligation  1996   Family History  Problem Relation Age of Onset  . Breast cancer Maternal Aunt   . Diabetes Mother   . Stroke Mother   . Mitral valve prolapse Mother   . Diabetes Maternal Aunt   . Heart disease Maternal Grandmother     great grandmother  . CAD Neg Hx   . Diabetes Father    Social History  Substance Use Topics  . Smoking status: Never Smoker   . Smokeless tobacco: Never Used  . Alcohol Use: No   OB History    No data available     Review of Systems  Constitutional: Negative for fever.  HENT: Negative for congestion and sore throat.   Eyes: Negative.  Negative for  visual disturbance.  Respiratory: Negative for chest tightness and shortness of breath.   Cardiovascular: Negative for chest pain.  Gastrointestinal: Negative for nausea, vomiting and abdominal pain.  Genitourinary: Negative.   Musculoskeletal: Negative for joint swelling, arthralgias and neck pain.  Skin: Negative.  Negative for rash and wound.  Neurological: Negative for dizziness, weakness, light-headedness, numbness and headaches.  Psychiatric/Behavioral: Negative.       Allergies  Tussionex pennkinetic er  Home Medications   Prior to Admission medications   Medication Sig Start Date End Date Taking? Authorizing Provider  aspirin-acetaminophen-caffeine (EXCEDRIN MIGRAINE) [id number] MG tablet Take 1 tablet by mouth every 6 (six) hours as needed for headache.   Yes Historical Provider, MD  benazepril (LOTENSIN) 40 MG tablet Take 1 tablet (40 mg total) by mouth daily. Patient taking differently: Take 40 mg by mouth 2 (two) times daily.  10/17/15  Yes Quentin MullingAmanda Collier, PA-C  cholecalciferol (VITAMIN D) 1000 UNITS tablet Take 1,000 Units by mouth daily.   Yes Historical Provider, MD  metFORMIN (GLUCOPHAGE-XR) 500 MG 24 hr tablet Take 1 tablet (500 mg total) by mouth 2 (two) times daily with a meal. 06/30/15  Yes Quentin MullingAmanda Collier, PA-C  pravastatin (PRAVACHOL) 40 MG tablet TAKE 1 TABLET BY MOUTH EVERY DAY AT BEDTIME FOR CHOLESTEROL 12/25/15  Yes  Lucky Cowboy, MD  albuterol (PROVENTIL HFA;VENTOLIN HFA) 108 (90 BASE) MCG/ACT inhaler Inhale 2 puffs into the lungs every 6 (six) hours as needed for wheezing or shortness of breath. 06/19/15   Quentin Mulling, PA-C  albuterol (PROVENTIL) (2.5 MG/3ML) 0.083% nebulizer solution Take 3 mLs (2.5 mg total) by nebulization every 6 (six) hours as needed for wheezing or shortness of breath. 09/15/15   Courtney Forcucci, PA-C  fluticasone (FLONASE) 50 MCG/ACT nasal spray Place 2 sprays into both nostrils daily. Patient taking differently: Place 2 sprays into  both nostrils daily as needed for allergies.  11/16/15 11/15/16  Courtney Forcucci, PA-C  mometasone-formoterol (DULERA) 100-5 MCG/ACT AERO Inhale 2 puffs into the lungs 2 (two) times daily. 09/12/15 11/16/15  Quentin Mulling, PA-C  promethazine-dextromethorphan (PROMETHAZINE-DM) 6.25-15 MG/5ML syrup Take 5 mLs by mouth 4 (four) times daily as needed for cough. Patient not taking: Reported on 12/27/2015 11/16/15   Toni Amend Forcucci, PA-C   BP 199/125 mmHg  Pulse 65  Temp(Src) 99 F (37.2 C) (Oral)  Resp 21  SpO2 100%  LMP 12/01/2015 Physical Exam  Constitutional: She appears well-developed and well-nourished.  HENT:  Head: Normocephalic and atraumatic.  Eyes: Conjunctivae are normal.  Neck: Normal range of motion. No JVD present.  Cardiovascular: Normal rate, regular rhythm, normal heart sounds and intact distal pulses.   Pulmonary/Chest: Effort normal and breath sounds normal. She has no wheezes. She has no rales.  Abdominal: Soft. Bowel sounds are normal. There is no tenderness.  Musculoskeletal: Normal range of motion. She exhibits no edema.  Neurological: She is alert.  Skin: Skin is warm and dry.  Psychiatric: She has a normal mood and affect.  Nursing note and vitals reviewed.   ED Course  Procedures (including critical care time) Labs Review Labs Reviewed  BASIC METABOLIC PANEL - Abnormal; Notable for the following:    Potassium 3.3 (*)    All other components within normal limits  CBC WITH DIFFERENTIAL/PLATELET - Abnormal; Notable for the following:    Hemoglobin 11.1 (*)    HCT 33.1 (*)    MCV 77.5 (*)    All other components within normal limits  Rosezena Sensor, ED    Imaging Review Dg Chest 2 View  12/27/2015  CLINICAL DATA:  Shortness of breath and hypertension for 3 days EXAM: CHEST  2 VIEW COMPARISON:  Dec 25, 2014 FINDINGS: Lungs are clear. Heart size and pulmonary vascularity are normal. No adenopathy. No bone lesions. IMPRESSION: No edema or consolidation.  Electronically Signed   By: Bretta Bang III M.D.   On: 12/27/2015 18:44   I have personally reviewed and evaluated these images and lab results as part of my medical decision-making.   EKG Interpretation   Date/Time:  Wednesday Dec 27 2015 16:10:96 EDT Ventricular Rate:  68 PR Interval:  187 QRS Duration: 94 QT Interval:  444 QTC Calculation: 472 R Axis:   55 Text Interpretation:  Sinus rhythm Confirmed by Rubin Payor  MD, Harrold Donath  [phone]) on 12/27/2015 8:17:53 PM Also confirmed by Rubin Payor  MD, Harrold Donath  [phone])  on 12/28/2015 1:25:07 AM      MDM   Final diagnoses:  Essential hypertension    Pt with hypertension with no evidence for end organ damage.  No sx suggesting these are acute bps today.  Spent significant time discussing this along with lab/cxr/ekg results.  She was advised close f/u with pcp, call in am for further guidance regarding change or addition of medications.  She was given her evening  dose of benazapril prior to dc home.   Discussed with Dr. Rubin Payor prior to dc home.    Burgess Amor, PA-C 12/28/15 0133  Benjiman Core, MD 12/30/15 [phone]

## 2015-12-27 NOTE — ED Notes (Signed)
PA at bedside.

## 2015-12-27 NOTE — ED Notes (Signed)
Pt reports was at PCP today had BP 185/125, denies headache  Nor dizziness, denies any symptom. Alert and oriented x 4. BP in triage 164/96

## 2015-12-27 NOTE — ED Notes (Signed)
Med requested from pharmacy.

## 2015-12-27 NOTE — Discharge Instructions (Signed)
Hypertension Hypertension, commonly called high blood pressure, is when the force of blood pumping through your arteries is too strong. Your arteries are the blood vessels that carry blood from your heart throughout your body. A blood pressure reading consists of a higher number over a lower number, such as 110/72. The higher number (systolic) is the pressure inside your arteries when your heart pumps. The lower number (diastolic) is the pressure inside your arteries when your heart relaxes. Ideally you want your blood pressure below 120/80. Hypertension forces your heart to work harder to pump blood. Your arteries may become narrow or stiff. Having untreated or uncontrolled hypertension can cause heart attack, stroke, kidney disease, and other problems. RISK FACTORS Some risk factors for high blood pressure are controllable. Others are not.  Risk factors you cannot control include:   Race. You may be at higher risk if you are African American.  Age. Risk increases with age.  Gender. Men are at higher risk than women before age 45 years. After age 65, women are at higher risk than men. Risk factors you can control include:  Not getting enough exercise or physical activity.  Being overweight.  Getting too much fat, sugar, calories, or salt in your diet.  Drinking too much alcohol. SIGNS AND SYMPTOMS Hypertension does not usually cause signs or symptoms. Extremely high blood pressure (hypertensive crisis) may cause headache, anxiety, shortness of breath, and nosebleed. DIAGNOSIS To check if you have hypertension, your health care provider will measure your blood pressure while you are seated, with your arm held at the level of your heart. It should be measured at least twice using the same arm. Certain conditions can cause a difference in blood pressure between your right and left arms. A blood pressure reading that is higher than normal on one occasion does not mean that you need treatment. If  it is not clear whether you have high blood pressure, you may be asked to return on a different day to have your blood pressure checked again. Or, you may be asked to monitor your blood pressure at home for 1 or more weeks. TREATMENT Treating high blood pressure includes making lifestyle changes and possibly taking medicine. Living a healthy lifestyle can help lower high blood pressure. You may need to change some of your habits. Lifestyle changes may include:  Following the DASH diet. This diet is high in fruits, vegetables, and whole grains. It is low in salt, red meat, and added sugars.  Keep your sodium intake below 2,300 mg per day.  Getting at least 30-45 minutes of aerobic exercise at least 4 times per week.  Losing weight if necessary.  Not smoking.  Limiting alcoholic beverages.  Learning ways to reduce stress. Your health care provider may prescribe medicine if lifestyle changes are not enough to get your blood pressure under control, and if one of the following is true:  You are 18-59 years of age and your systolic blood pressure is above 140.  You are 60 years of age or older, and your systolic blood pressure is above 150.  Your diastolic blood pressure is above 90.  You have diabetes, and your systolic blood pressure is over 140 or your diastolic blood pressure is over 90.  You have kidney disease and your blood pressure is above 140/90.  You have heart disease and your blood pressure is above 140/90. Your personal target blood pressure may vary depending on your medical conditions, your age, and other factors. HOME CARE INSTRUCTIONS    Have your blood pressure rechecked as directed by your health care provider.   Take medicines only as directed by your health care provider. Follow the directions carefully. Blood pressure medicines must be taken as prescribed. The medicine does not work as well when you skip doses. Skipping doses also puts you at risk for  problems.  Do not smoke.   Monitor your blood pressure at home as directed by your health care provider. SEEK MEDICAL CARE IF:   You think you are having a reaction to medicines taken.  You have recurrent headaches or feel dizzy.  You have swelling in your ankles.  You have trouble with your vision. SEEK IMMEDIATE MEDICAL CARE IF:  You develop a severe headache or confusion.  You have unusual weakness, numbness, or feel faint.  You have severe chest or abdominal pain.  You vomit repeatedly.  You have trouble breathing. MAKE SURE YOU:   Understand these instructions.  Will watch your condition.  Will get help right away if you are not doing well or get worse.   This information is not intended to replace advice given to you by your health care provider. Make sure you discuss any questions you have with your health care provider.   Document Released: 07/29/2005 Document Revised: 12/13/2014 Document Reviewed: 05/21/2013 Elsevier Interactive Patient Education 2016 [employer]  

## 2015-12-27 NOTE — Progress Notes (Signed)
  Subjective:    Patient ID: Alexis Marquez, female    DOB: 1971/06/30, 45 y.o.   MRN: [id number]  HPI   45 yo SBF with hx/o longstanding severe HTN reports home BP's ranging in the 190's. Today BP is 200/110 by the nurse and rechecked by myself at 185/125.    Medication Sig  . albuterol HFA  inhaler Inhale 2 puffs into the lungs every 6 (six) hours as needed for wheezing or shortness of breath.  Marland Kitchen. albuterol  (2.5 MG/3ML) 0.083% neb soln Take 3 mLs (2.5 mg total) by nebulization every 6 (six) hours as needed for wheezing or shortness of breath.  . benazepril  40 MG tablet Take 1 tablet (40 mg total) by mouth daily. (Patient taking differently: Take 40 mg by mouth 2 (two) times daily. )  . VITAMIN D)1000 UNITS tablet Take 1,000 Units by mouth daily.  Marland Kitchen. FLONASE nasal spray Place 2 sprays into both nostrils daily.  . metFORMIN-XR 500 MG 24 hr tablet Take 1 tablet (500 mg total) by mouth 2 (two) times daily with a meal.  . pravastatin 40 MG tablet TAKE 1 TABLET BY MOUTH EVERY DAY AT BEDTIME FOR CHOLESTEROL  . DULERA 100-5  Inhale 2 puffs into the lungs 2 (two) times daily.  Wilnette Kales. IFUSION PLUS 1 capsule daily for iron def anemia   Allergies  Allergen Reactions  . Tussionex Pennkinetic Er [Hydrocod Polst-Cpm Polst Er]     causes headache   Past Medical History  Diagnosis Date  . Hypertension   . High cholesterol   . Asthma   . DM (diabetes mellitus) (HCC)     borderline  . Low blood potassium   . Iron deficiency anemia   . Allergic rhinitis    Review of Systems  10 point systems review negative except as above.     Objective:   Physical Exam  BP 200/110 mmHg  Pulse 72  Temp(Src) 97.7 F (36.5 C)  Resp 16  Ht 5' (1.524 m)  Wt 198 lb 3.2 oz (89.903 kg)  BMI 38.71 kg/m2  No formal exam today     Assessment & Plan:   1. Hypertensive crisis  - Advised ER evaluation  (No Charge OV)

## 2015-12-28 ENCOUNTER — Encounter: Payer: Self-pay | Admitting: Internal Medicine

## 2015-12-28 ENCOUNTER — Ambulatory Visit (INDEPENDENT_AMBULATORY_CARE_PROVIDER_SITE_OTHER): Payer: BLUE CROSS/BLUE SHIELD | Admitting: Internal Medicine

## 2015-12-28 VITALS — BP 188/104 | HR 76 | Temp 97.7°F | Resp 16 | Ht 60.0 in | Wt 198.3 lb

## 2015-12-28 DIAGNOSIS — R7303 Prediabetes: Secondary | ICD-10-CM

## 2015-12-28 DIAGNOSIS — Z79899 Other long term (current) drug therapy: Secondary | ICD-10-CM

## 2015-12-28 DIAGNOSIS — E559 Vitamin D deficiency, unspecified: Secondary | ICD-10-CM

## 2015-12-28 DIAGNOSIS — E782 Mixed hyperlipidemia: Secondary | ICD-10-CM | POA: Diagnosis not present

## 2015-12-28 DIAGNOSIS — I10 Essential (primary) hypertension: Secondary | ICD-10-CM | POA: Diagnosis not present

## 2015-12-28 MED ORDER — BISOPROLOL-HYDROCHLOROTHIAZIDE 10-6.25 MG PO TABS: 1.0000 | ORAL_TABLET | Freq: Every day | ORAL | Status: DC

## 2015-12-28 NOTE — Patient Instructions (Signed)
Hypertension  Hypertension, commonly called high blood pressure, is when the force of blood pumping through your arteries is too strong. Your arteries are the blood vessels that carry blood from your heart throughout your body. A blood pressure reading consists of a higher number over a lower number, such as 110/72. The higher number (systolic) is the pressure inside your arteries when your heart pumps. The lower number (diastolic) is the pressure inside your arteries when your heart relaxes. Ideally you want your blood pressure below 120/80. Hypertension forces your heart to work harder to pump blood. Your arteries may become narrow or stiff. Having untreated or uncontrolled hypertension can cause heart attack, stroke, kidney disease, and other problems. RISK FACTORS Some risk factors for high blood pressure are controllable. Others are not.  Risk factors you cannot control include:   Race. You may be at higher risk if you are African American.  Age. Risk increases with age.  Gender. Men are at higher risk than women before age 44 years. After age 44, women are at higher risk than men. Risk factors you can control include:  Not getting enough exercise or physical activity.  Being overweight.  Getting too much fat, sugar, calories, or salt in your diet.  Drinking too much alcohol. SIGNS AND SYMPTOMS Hypertension does not usually cause signs or symptoms. Extremely high blood pressure (hypertensive crisis) may cause headache, anxiety, shortness of breath, and nosebleed. DIAGNOSIS To check if you have hypertension, your health care provider will measure your blood pressure while you are seated, with your arm held at the level of your heart. It should be measured at least twice using the same arm. Certain conditions can cause a difference in blood pressure between your right and left arms. A blood pressure reading that is higher than normal on one occasion does not mean that you need treatment.  If it is not clear whether you have high blood pressure, you may be asked to return on a different day to have your blood pressure checked again. Or, you may be asked to monitor your blood pressure at home for 1 or more weeks. TREATMENT Treating high blood pressure includes making lifestyle changes and possibly taking medicine. Living a healthy lifestyle can help lower high blood pressure. You may need to change some of your habits. Lifestyle changes may include:  Following the DASH diet. This diet is high in fruits, vegetables, and whole grains. It is low in salt, red meat, and added sugars.  Keep your sodium intake below 2,300 mg per day.  Getting at least 30-45 minutes of aerobic exercise at least 4 times per week.  Losing weight if necessary.  Not smoking.  Limiting alcoholic beverages.  Learning ways to reduce stress. Your health care provider may prescribe medicine if lifestyle changes are not enough to get your blood pressure under control, and if one of the following is true:  You are 5218-44 years of age and your systolic blood pressure is above 140.  You are 160 years of age or older, and your systolic blood pressure is above 150.  Your diastolic blood pressure is above 90.  You have diabetes, and your systolic blood pressure is over 140 or your diastolic blood pressure is over 90.  You have kidney disease and your blood pressure is above 140/90.  You have heart disease and your blood pressure is above 140/90. Your personal target blood pressure may vary depending on your medical conditions, your age, and other factors. HOME CARE  INSTRUCTIONS  Have your blood pressure rechecked as directed by your health care provider.   Take medicines only as directed by your health care provider. Follow the directions carefully. Blood pressure medicines must be taken as prescribed. The medicine does not work as well when you skip doses. Skipping doses also puts you at risk for  problems.  Do not smoke.   Monitor your blood pressure at home as directed by your health care provider. SEEK MEDICAL CARE IF:   You think you are having a reaction to medicines taken.  You have recurrent headaches or feel dizzy.  You have swelling in your ankles.  You have trouble with your vision. SEEK IMMEDIATE MEDICAL CARE IF:  You develop a severe headache or confusion.  You have unusual weakness, numbness, or feel faint.  You have severe chest or abdominal pain.  You vomit repeatedly.  You have trouble breathing. MAKE SURE YOU:   Understand these instructions.  Will watch your condition.  Will get help right away if you are not doing well or get worse.   This information is not intended to replace advice given to you by your health care provider. Make sure you discuss any questions you have with your health care provider.   Document Released: 07/29/2005 Document Revised: 12/13/2014 Document Reviewed: 05/21/2013 Elsevier Interactive Patient Education 2016 [employer]   ++++++++++++++++++++++++++++++++++++++++++++++++++++++++++++  Recommend Adult Low Dose Aspirin or   coated  Aspirin 81 mg daily   To reduce risk of Colon Cancer 20 %,   Skin Cancer 26 % ,   Melanoma 46%   and   Pancreatic cancer 60%   ++++++++++++++++++++++++++++++++++++++++++++++++++++++ Vitamin D goal   is between 70-100.   Please make sure that you are taking your Vitamin D as directed.   It is very important as a natural anti-inflammatory   helping hair, skin, and nails, as well as reducing stroke and heart attack risk.   It helps your bones and helps with mood.  It also decreases numerous cancer risks so please take it as directed.   Low Vit D is associated with a 200-300% higher risk for CANCER   and 200-300% higher risk for HEART   ATTACK  &  STROKE.   .....................................Marland Kitchen  It is also associated with higher death rate at younger ages,    autoimmune diseases like Rheumatoid arthritis, Lupus, Multiple Sclerosis.     Also many other serious conditions, like depression, Alzheimer's  Dementia, infertility, muscle aches, fatigue, fibromyalgia - just to name a few.  ++++++++++++++++++++++++++++++++++++++++++++++++  Recommend the book "The END of DIETING" by Dr Monico Hoar   & the book "The END of DIABETES " by Dr Monico Hoar  At [email or web address] - get book & Audio CD's     Being diabetic has a  300% increased risk for heart attack, stroke, cancer, and alzheimer- type vascular dementia. It is very important that you work harder with diet by avoiding all foods that are white. Avoid white rice (brown & wild rice is OK), white potatoes (sweetpotatoes in moderation is OK), White bread or wheat bread or anything made out of white flour like bagels, donuts, rolls, buns, biscuits, cakes, pastries, cookies, pizza crust, and pasta (made from white flour & egg whites) - vegetarian pasta or spinach or wheat pasta is OK. Multigrain breads like Arnold's or Pepperidge Farm, or multigrain sandwich thins or flatbreads.  Diet, exercise and weight loss can reverse and cure diabetes in the early stages.  Diet, exercise and weight  loss is very important in the control and prevention of complications of diabetes which affects every system in your body, ie. Brain - dementia/stroke, eyes - glaucoma/blindness, heart - heart attack/heart failure, kidneys - dialysis, stomach - gastric paralysis, intestines - malabsorption, nerves - severe painful neuritis, circulation - gangrene & loss of a leg(s), and finally cancer and Alzheimers.    I recommend avoid fried & greasy foods,  sweets/candy, white rice (brown or wild rice or Quinoa is OK), white potatoes (sweet potatoes are OK) - anything made from white flour - bagels, doughnuts, rolls, buns, biscuits,white and wheat breads, pizza crust and traditional pasta made of white flour & egg white(vegetarian pasta or spinach  or wheat pasta is OK).  Multi-grain bread is OK - like multi-grain flat bread or sandwich thins. Avoid alcohol in excess. Exercise is also important.    Eat all the vegetables you want - avoid meat, especially red meat and dairy - especially cheese.  Cheese is the most concentrated form of trans-fats which is the worst thing to clog up our arteries. Veggie cheese is OK which can be found in the fresh produce section at [employer] or [employer] or [employer]  ++++++++++++++++++++++++++++++++++++++++++++++++++ DASH Eating Plan  DASH stands for "Dietary Approaches to Stop Hypertension."   The DASH eating plan is a healthy eating plan that has been shown to reduce high blood pressure (hypertension). Additional health benefits may include reducing the risk of type 2 diabetes mellitus, heart disease, and stroke. The DASH eating plan may also help with weight loss.  WHAT DO I NEED TO KNOW ABOUT THE DASH EATING PLAN?  For the DASH eating plan, you will follow these general guidelines:  Choose foods with a percent daily value for sodium of less than 5% (as listed on the food label).  Use salt-free seasonings or herbs instead of table salt or sea salt.  Check with your health care provider or pharmacist before using salt substitutes.  Eat lower-sodium products, often labeled as "lower sodium" or "no salt added."  Eat fresh foods.  Eat more vegetables, fruits, and low-fat dairy products.    Choose whole grains. Look for the word "whole" as the first word in the ingredient list.  Choose fish   Limit sweets, desserts, sugars, and sugary drinks.  Choose heart-healthy fats.  Eat veggie cheese   Eat more home-cooked food and less restaurant, buffet, and fast food.  Limit fried foods.  Cook foods using methods other than frying.  Limit canned vegetables. If you do use them, rinse them well to decrease the sodium.  When eating at a restaurant, ask that your food be prepared with less  salt, or no salt if possible.                      WHAT FOODS CAN I EAT?  Read Dr Francis Dowse Fuhrman's books on The End of Dieting & The End of Diabetes  Grains  Whole grain or whole wheat bread. Brown rice. Whole grain or whole wheat pasta. Quinoa, bulgur, and whole grain cereals. Low-sodium cereals. Corn or whole wheat flour tortillas. Whole grain cornbread. Whole grain crackers. Low-sodium crackers.  Vegetables  Fresh or frozen vegetables (raw, steamed, roasted, or grilled). Low-sodium or reduced-sodium tomato and vegetable juices. Low-sodium or reduced-sodium tomato sauce and paste. Low-sodium or reduced-sodium canned vegetables.   Fruits  All fresh, canned (in natural juice), or frozen fruits.  Protein Products   All fish and seafood.  Dried beans,  peas, or lentils. Unsalted nuts and seeds. Unsalted canned beans.  Dairy  Low-fat dairy products, such as skim or 1% milk, 2% or reduced-fat cheeses, low-fat ricotta or cottage cheese, or plain low-fat yogurt. Low-sodium or reduced-sodium cheeses.  Fats and Oils  Tub margarines without trans fats. Light or reduced-fat mayonnaise and salad dressings (reduced sodium). Avocado. Safflower, olive, or canola oils. Natural peanut or almond butter.  Other  Unsalted popcorn and pretzels. The items listed above may not be a complete list of recommended foods or beverages. Contact your dietitian for more options.  +++++++++++++++++++++++++++++++++++++++++++  WHAT FOODS ARE NOT RECOMMENDED?  Grains/ White flour or wheat flour  White bread. White pasta. White rice. Refined cornbread. Bagels and croissants. Crackers that contain trans fat.  Vegetables  Creamed or fried vegetables. Vegetables in a . Regular canned vegetables. Regular canned tomato sauce and paste. Regular tomato and vegetable juices.  Fruits  Dried fruits. Canned fruit in light or heavy syrup. Fruit juice.  Meat and Other Protein Products  Meat in general - RED mwaet &  White meat.  Fatty cuts of meat. Ribs, chicken wings, bacon, sausage, bologna, salami, chitterlings, fatback, hot dogs, bratwurst, and packaged luncheon meats.  Dairy  Whole or 2% milk, cream, half-and-half, and cream cheese. Whole-fat or sweetened yogurt. Full-fat cheeses or blue cheese. Nondairy creamers and whipped toppings. Processed cheese, cheese spreads, or cheese curds.  Condiments  Onion and garlic salt, seasoned salt, table salt, and sea salt. Canned and packaged gravies. Worcestershire sauce. Tartar sauce. Barbecue sauce. Teriyaki sauce. Soy sauce, including reduced sodium. Steak sauce. Fish sauce. Oyster sauce. Cocktail sauce. Horseradish. Ketchup and mustard. Meat flavorings and tenderizers. Bouillon cubes. Hot sauce. Tabasco sauce. Marinades. Taco seasonings. Relishes.  Fats and Oils Butter, stick margarine, lard, shortening and bacon fat. Coconut, palm kernel, or palm oils. Regular salad dressings.  Pickles and olives. Salted popcorn and pretzels.  The items listed above may not be a complete list of foods and beverages to avoid.

## 2015-12-29 LAB — HEPATIC FUNCTION PANEL
ALBUMIN: 4 g/dL (ref 3.6–5.1)
ALT: 16 U/L (ref 6–29)
AST: 15 U/L (ref 10–30)
Alkaline Phosphatase: 50 U/L (ref 33–115)
BILIRUBIN DIRECT: 0.1 mg/dL (ref <=0.2)
Indirect Bilirubin: 0.2 mg/dL (ref 0.2–1.2)
Total Bilirubin: 0.3 mg/dL (ref 0.2–1.2)
Total Protein: 7 g/dL (ref 6.1–8.1)

## 2015-12-29 LAB — CBC WITH DIFFERENTIAL/PLATELET
BASOS ABS: 0 {cells}/uL (ref 0–200)
Basophils Relative: 0 %
EOS PCT: 4 %
Eosinophils Absolute: 240 cells/uL (ref 15–500)
HEMATOCRIT: 32.2 % — AB (ref 35.0–45.0)
HEMOGLOBIN: 10.3 g/dL — AB (ref 11.7–15.5)
LYMPHS PCT: 39 %
Lymphs Abs: 2340 cells/uL (ref 850–3900)
MCH: 25.9 pg — AB (ref 27.0–33.0)
MCHC: 32 g/dL (ref 32.0–36.0)
MCV: 80.9 fL (ref 80.0–100.0)
MONO ABS: 540 {cells}/uL (ref 200–950)
MPV: 9.4 fL (ref 7.5–12.5)
Monocytes Relative: 9 %
NEUTROS PCT: 48 %
Neutro Abs: 2880 cells/uL (ref 1500–7800)
Platelets: 284 10*3/uL (ref 140–400)
RBC: 3.98 MIL/uL (ref 3.80–5.10)
RDW: 14 % (ref 11.0–15.0)
WBC: 6 10*3/uL (ref 3.8–10.8)

## 2015-12-29 LAB — TSH: TSH: 1.11 m[IU]/L

## 2015-12-29 LAB — HEMOGLOBIN A1C
HEMOGLOBIN A1C: 5.3 % (ref <5.7)
MEAN PLASMA GLUCOSE: 105 mg/dL

## 2015-12-29 LAB — MAGNESIUM: MAGNESIUM: 2 mg/dL (ref 1.5–2.5)

## 2015-12-29 LAB — LIPID PANEL
CHOL/HDL RATIO: 3.7 ratio (ref <=5.0)
CHOLESTEROL: 179 mg/dL (ref 125–200)
HDL: 49 mg/dL (ref >=46)
LDL Cholesterol: 105 mg/dL (ref <130)
Triglycerides: 124 mg/dL (ref <150)
VLDL: 25 mg/dL (ref <30)

## 2015-12-29 LAB — VITAMIN D 25 HYDROXY (VIT D DEFICIENCY, FRACTURES): VIT D 25 HYDROXY: 19 ng/mL — AB (ref 30–100)

## 2015-12-29 LAB — INSULIN, RANDOM: INSULIN: 34 u[IU]/mL — AB (ref 2.0–19.6)

## 2015-12-29 NOTE — Progress Notes (Signed)
Patient ID: Alexis Sabaliffany N Marquez, female   DOB: 10/11/1970, 45 y.o.   MRN: [id number]  [facility]                       Lucky CowboyWilliam Aayden Cefalu, M.D.        Dyanne CarrelAmanda R. Steffanie Dunnollier, P.A.-C       Terri Piedraourtney Forcucci, P.A.-C   [facility]                [address]                [place], [place] [id number] Telephone [phone] Telefax [phone] _________________________________________________________________________   This very nice 45 y.o. single BF presents for  follow up with Labile Hypertension, Hyperlipidemia, Pre-Diabetes and Vitamin D Deficiency. Patient had been seen the previous day and BP's were recorded at 200/125 and 185/110 and she was sent to the ER for accelerated HTN, whereupon she had negative labs incl. troponin and EKG and was discharged home with a BP of 199/125 and given her evening dose of Benazepril recommending close f/u at this office.     Patient is treated for HTN circa 2000 & BP has been controlled at home. Today's BP is still severely elevated at 188/104 and suprisingly she is relatively asymptomatic. Patient has had no complaints of any cardiac type chest pain, palpitations, dyspnea/orthopnea/PND, dizziness, claudication or dependent edema.   Hyperlipidemia is controlled with diet & meds. Patient denies myalgias or other med SE's. Last Lipids were near goal with Cholesterol 179; HDL 49; LDL 105; Triglycerides 124 on 12/28/2015.   Also, the patient has history of PreDiabetes with A1c 5.9% in 2009 and A1c 6.3% in Apr 2014 and has had no symptoms of reactive hypoglycemia, diabetic polys, paresthesias or visual blurring. Patient was started on Metformin in Jan 2015 for Insulin Resistance & in hopes of facilitating weight loss.  Last A1c was 5.3% on 12/28/2015.    Further, the patient also has history of Vitamin D Deficiency of "20" in 2008 and supplements vitamin D without any suspected side-effects. Last vitamin D  was still severely low at  19 on 12/28/2015.  Sig  . Inhale 2 puffs into the lungs every 6 (six) hours as needed for wheezing or shortness of breath.  . Take 3 mLs (2.5 mg total) by nebulization every 6 (six) hours as needed for wheezing or shortness of breath.  . Take 1 tablet by mouth every 6 (six) hours as needed for headache.  . Take 1,000 Units by mouth daily.  . Place 2 sprays into both nostrils daily. (Patient taking differently: Place 2 sprays into both nostrils daily as needed for allergies. )  . Take 1 tablet (500 mg total) by mouth 2 (two) times daily with a meal.  . TAKE 1 TABLET BY MOUTH EVERY DAY AT BEDTIME FOR CHOLESTEROL  . Take 5 mLs by mouth 4 (four) times daily as needed for cough.  . Inhale 2 puffs into the lungs 2 (two) times daily.  . Take 1 tablet (40 mg total) by mouth daily. (Patient taking differently: Take 40 mg by mouth 2 (two) times daily. )   Allergies  Allergen Reactions  . Tussionex Pennkinetic Er [Hydrocod Polst-Cpm Polst Er]     causes headache    PMHx:   Past Medical History  Diagnosis Date  . Hypertension   . High cholesterol   . Asthma   . DM (diabetes mellitus) (HCC)  borderline  . Low blood potassium   . Iron deficiency anemia   . Allergic rhinitis    Immunization History  Administered Date(s) Administered  . Influenza Split 06/09/2015  . Pneumococcal-Unspecified 08/13/2007  . Td 11/10/2005   Past Surgical History  Procedure Laterality Date  . Cesarean section      x 4  . Umbilical hernia repair  1977  . Tubal ligation  1996   FHx:    Reviewed / unchanged  SHx:    Reviewed / unchanged  Systems Review:  Constitutional: Denies fever, chills, wt changes, headaches, insomnia, fatigue, night sweats, change in appetite. Eyes: Denies redness, blurred vision, diplopia, discharge, itchy, watery eyes.  ENT: Denies discharge, congestion, post nasal drip, epistaxis, sore throat, earache, hearing loss, dental pain, tinnitus, vertigo,  sinus pain, snoring.  CV: Denies chest pain, palpitations, irregular heartbeat, syncope, dyspnea, diaphoresis, orthopnea, PND, claudication or edema. Respiratory: denies cough, dyspnea, DOE, pleurisy, hoarseness, laryngitis, wheezing.  Gastrointestinal: Denies dysphagia, odynophagia, heartburn, reflux, water brash, abdominal pain or cramps, nausea, vomiting, bloating, diarrhea, constipation, hematemesis, melena, hematochezia  or hemorrhoids. Genitourinary: Denies dysuria, frequency, urgency, nocturia, hesitancy, discharge, hematuria or flank pain. Musculoskeletal: Denies arthralgias, myalgias, stiffness, jt. swelling, pain, limping or strain/sprain.  Skin: Denies pruritus, rash, hives, warts, acne, eczema or change in skin lesion(s). Neuro: No weakness, tremor, incoordination, spasms, paresthesia or pain. Psychiatric: Denies confusion, memory loss or sensory loss. Endo: Denies change in weight, skin or hair change.  Heme/Lymph: No excessive bleeding, bruising or enlarged lymph nodes.  Physical Exam  BP 188/104 mmHg  Pulse 76  Temp(Src) 97.7 F (36.5 C)  Resp 16  Ht 5' (1.524 m)  Wt 198 lb 4.8 oz (89.948 kg)  BMI 38.73 kg/m2  LMP 12/01/2015  Appears over  nourished and in no distress. Eyes: PERRLA, EOMs, conjunctiva no swelling or erythema. Sinuses: No frontal/maxillary tenderness ENT/Mouth: EAC's clear, TM's nl w/o erythema, bulging. Nares clear w/o erythema, swelling, exudates. Oropharynx clear without erythema or exudates. Oral hygiene is good. Tongue normal, non obstructing. Hearing intact.  Neck: Supple. Thyroid nl. Car 2+/2+ without bruits, nodes or JVD. Chest: Respirations nl with BS clear & equal w/o rales, rhonchi, wheezing or stridor.  Cor: Heart sounds normal w/ regular rate and rhythm without sig. murmurs, gallops, clicks, or rubs. Peripheral pulses normal and equal  without edema.  Abdomen: Soft & bowel sounds normal. Non-tender w/o guarding, rebound, hernias, masses, or  organomegaly.  Lymphatics: Unremarkable.  Musculoskeletal: Full ROM all peripheral extremities, joint stability, 5/5 strength, and normal gait.  Skin: Warm, dry without exposed rashes, lesions or ecchymosis apparent.  Neuro: Cranial nerves intact, reflexes equal bilaterally. Sensory-motor testing grossly intact. Tendon reflexes grossly intact.  Pysch: Alert & oriented x 3.  Insight and judgement nl & appropriate. No ideations.  Assessment and Plan:  1. Essential hypertension / Accelerated Hypertension   - Continue benazepril (LOTENSIN) 40 MG tablet; Take 40 mg by mouth 2 (two) times daily. - TSH - Add Rx: bisoprolol-hydrochlorothiazide (ZIAC) 10-6.25 MG tablet; Take 1 tablet by mouth daily.  Dispense: 90 tablet; Refill: 1 - ROV 2 weeks with list of BP's.  2. Mixed hyperlipidemia  - Lipid panel - TSH  3. Prediabetes  - Hemoglobin A1c - Insulin, random  4. Vitamin D deficiency  - VITAMIN D 25 Hydroxy   5. Medication management  - CBC with Differential/Platelet - Hepatic function panel - Magnesium   Recommended regular exercise, BP monitoring, weight control, and discussed med and SE's. Recommended labs  to assess and monitor clinical status. Further disposition pending results of labs. Over 30 minutes of exam, counseling, chart review was performed

## 2016-01-11 ENCOUNTER — Encounter: Payer: Self-pay | Admitting: Internal Medicine

## 2016-01-11 ENCOUNTER — Ambulatory Visit (INDEPENDENT_AMBULATORY_CARE_PROVIDER_SITE_OTHER): Payer: BLUE CROSS/BLUE SHIELD | Admitting: Physician Assistant

## 2016-01-11 VITALS — BP 158/69 | HR 56 | Temp 97.5°F | Resp 16 | Ht 60.0 in | Wt 196.4 lb

## 2016-01-11 DIAGNOSIS — I10 Essential (primary) hypertension: Secondary | ICD-10-CM

## 2016-01-11 MED ORDER — MINOXIDIL 2.5 MG PO TABS: 2.5000 mg | ORAL_TABLET | Freq: Every day | ORAL | Status: DC

## 2016-01-11 NOTE — Patient Instructions (Signed)
Monitor your blood pressure at home. Go to the ER if any CP, SOB, nausea, dizziness, severe HA, changes vision/speech  Goal BP:  For patients younger than 60: Goal BP < 140/90. For patients 60 and older: Goal BP < 150/90. For patients with diabetes: Goal BP < 140/90. Your most recent BP: BP: (!) 158/69 mmHg   Take your medications faithfully as instructed. Maintain a healthy weight. Get at least 150 minutes of aerobic exercise per week. Minimize salt intake. Minimize alcohol intake  DASH Eating Plan DASH stands for "Dietary Approaches to Stop Hypertension." The DASH eating plan is a healthy eating plan that has been shown to reduce high blood pressure (hypertension). Additional health benefits may include reducing the risk of type 2 diabetes mellitus, heart disease, and stroke. The DASH eating plan may also help with weight loss. WHAT DO I NEED TO KNOW ABOUT THE DASH EATING PLAN? For the DASH eating plan, you will follow these general guidelines:  Choose foods with a percent daily value for sodium of less than 5% (as listed on the food label).  Use salt-free seasonings or herbs instead of table salt or sea salt.  Check with your health care provider or pharmacist before using salt substitutes.  Eat lower-sodium products, often labeled as "lower sodium" or "no salt added."  Eat fresh foods.  Eat more vegetables, fruits, and low-fat dairy products.  Choose whole grains. Look for the word "whole" as the first word in the ingredient list.  Choose fish and skinless chicken or Mal[place]key more often than red meat. Limit fish, poultry, and meat to 6 oz (170 g) each day.  Limit sweets, desserts, sugars, and sugary drinks.  Choose heart-healthy fats.  Limit cheese to 1 oz (28 g) per day.  Eat more home-cooked food and less restaurant, buffet, and fast food.  Limit fried foods.  Cook foods using methods other than frying.  Limit canned vegetables. If you do use them, rinse them well  to decrease the sodium.  When eating at a restaurant, ask that your food be prepared with less salt, or no salt if possible. WHAT FOODS CAN I EAT? Seek help from a dietitian for individual calorie needs. Grains Whole grain or whole wheat bread. Brown rice. Whole grain or whole wheat pasta. Quinoa, bulgur, and whole grain cereals. Low-sodium cereals. Corn or whole wheat flour tortillas. Whole grain cornbread. Whole grain crackers. Low-sodium crackers. Vegetables Fresh or frozen vegetables (raw, steamed, roasted, or grilled). Low-sodium or reduced-sodium tomato and vegetable juices. Low-sodium or reduced-sodium tomato sauce and paste. Low-sodium or reduced-sodium canned vegetables.  Fruits All fresh, canned (in natural juice), or frozen fruits. Meat and Other Protein Products Ground beef (85% or leaner), grass-fed beef, or beef trimmed of fat. Skinless chicken or [place]key. Ground chicken or [place]key. Pork trimmed of fat. All fish and seafood. Eggs. Dried beans, peas, or lentils. Unsalted nuts and seeds. Unsalted canned beans. Dairy Low-fat dairy products, such as skim or 1% milk, 2% or reduced-fat cheeses, low-fat ricotta or cottage cheese, or plain low-fat yogurt. Low-sodium or reduced-sodium cheeses. Fats and Oils Tub margarines without trans fats. Light or reduced-fat mayonnaise and salad dressings (reduced sodium). Avocado. Safflower, olive, or canola oils. Natural peanut or almond butter. Other Unsalted popcorn and pretzels. The items listed above may not be a complete list of recommended foods or beverages. Contact your dietitian for more options. WHAT FOODS ARE NOT RECOMMENDED? Grains White bread. White pasta. White rice. Refined cornbread. Bagels and croissants. Crackers that  contain trans fat. Vegetables Creamed or fried vegetables. Vegetables in a cheese sauce. Regular canned vegetables. Regular canned tomato sauce and paste. Regular tomato and vegetable juices. Fruits Dried fruits.  Canned fruit in light or heavy syrup. Fruit juice. Meat and Other Protein Products Fatty cuts of meat. Ribs, chicken wings, bacon, sausage, bologna, salami, chitterlings, fatback, hot dogs, bratwurst, and packaged luncheon meats. Salted nuts and seeds. Canned beans with salt. Dairy Whole or 2% milk, cream, half-and-half, and cream cheese. Whole-fat or sweetened yogurt. Full-fat cheeses or blue cheese. Nondairy creamers and whipped toppings. Processed cheese, cheese spreads, or cheese curds. Condiments Onion and garlic salt, seasoned salt, table salt, and sea salt. Canned and packaged gravies. Worcestershire sauce. Tartar sauce. Barbecue sauce. Teriyaki sauce. Soy sauce, including reduced sodium. Steak sauce. Fish sauce. Oyster sauce. Cocktail sauce. Horseradish. Ketchup and mustard. Meat flavorings and tenderizers. Bouillon cubes. Hot sauce. Tabasco sauce. Marinades. Taco seasonings. Relishes. Fats and Oils Butter, stick margarine, lard, shortening, ghee, and bacon fat. Coconut, palm kernel, or palm oils. Regular salad dressings. Other Pickles and olives. Salted popcorn and pretzels. The items listed above may not be a complete list of foods and beverages to avoid. Contact your dietitian for more information. WHERE CAN I FIND MORE INFORMATION? National Heart, Lung, and Blood Institute: travelstabloid.com Document Released: 07/18/2011 Document Revised: 12/13/2013 Document Reviewed: 06/02/2013 [facility] Patient Information 2015 [place], [place]. This information is not intended to replace advice given to you by your health care provider. Make sure you discuss any questions you have with your health care provider.

## 2016-01-11 NOTE — Progress Notes (Signed)
Assessment and Plan: Hypertension- continue benazepril 40 BID, ziac 10, and will add on minoxidil low dose 2.5mg , follow up 4 weeks, notify office of any swelling or call if BP still above 140 in 2 weeks.  Obesity with co morbidities- long discussion about weight loss, diet, and exercise  Future Appointments Date Time Provider Department Center  06/10/2016 10:00 AM Courtney Forcucci, PA-C [id number] None    HPI 45 y.o.female presents for BP evaluation, she went to the ER for accelerated HTN, negative EKG/labs, she is on benazepril  BID and ziac . Her BP has improved from 200's to 160's. She is checking them at home, no headaches, changes in vision, CP, SOB, weakness, edema.  BP Readings from Last 5 Encounters:  01/11/16 158/69  12/28/15 188/104  12/27/15 199/125  12/27/15 200/110  11/16/15 164/92     Past Medical History  Diagnosis Date  . Hypertension   . High cholesterol   . Asthma   . DM (diabetes mellitus) (HCC)     borderline  . Low blood potassium   . Iron deficiency anemia   . Allergic rhinitis      Allergies  Allergen Reactions  . Tussionex Pennkinetic Er [Hydrocod Polst-Cpm Polst Er]     causes headache      Current Outpatient Prescriptions on File Prior to Visit  Medication Sig Dispense Refill  . albuterol (PROVENTIL HFA;VENTOLIN HFA) 108 (90 BASE) MCG/ACT inhaler Inhale 2 puffs into the lungs every 6 (six) hours as needed for wheezing or shortness of breath. 1 Inhaler 2  . albuterol (PROVENTIL) (2.5 MG/3ML) 0.083% nebulizer solution Take 3 mLs (2.5 mg total) by nebulization every 6 (six) hours as needed for wheezing or shortness of breath. 75 mL 12  . aspirin-acetaminophen-caffeine (EXCEDRIN MIGRAINE) 250-250-65 MG tablet Take 1 tablet by mouth every 6 (six) hours as needed for headache.    . benazepril (LOTENSIN) 40 MG tablet Take 40 mg by mouth 2 (two) times daily.    . bisoprolol-hydrochlorothiazide (ZIAC) 10-6.25 MG tablet Take 1 tablet by mouth  daily. 90 tablet 1  . cholecalciferol (VITAMIN D) 1000 UNITS tablet Take 1,000 Units by mouth daily.    . fluticasone (FLONASE) 50 MCG/ACT nasal spray Place 2 sprays into both nostrils daily. (Patient taking differently: Place 2 sprays into both nostrils daily as needed for allergies. ) 16 g 2  . metFORMIN (GLUCOPHAGE-XR) 500 MG 24 hr tablet Take 1 tablet (500 mg total) by mouth 2 (two) times daily with a meal. 90 tablet 3  . pravastatin (PRAVACHOL) 40 MG tablet TAKE 1 TABLET BY MOUTH EVERY DAY AT BEDTIME FOR CHOLESTEROL 90 tablet 1  . promethazine-dextromethorphan (PROMETHAZINE-DM) 6.25-15 MG/5ML syrup Take 5 mLs by mouth 4 (four) times daily as needed for cough. 473 mL 0  . mometasone-formoterol (DULERA) 100-5 MCG/ACT AERO Inhale 2 puffs into the lungs 2 (two) times daily. 1 Inhaler 0   No current facility-administered medications on file prior to visit.    ROS: all negative except above.   Physical Exam: Filed Weights   01/11/16 1455  Weight: 196 lb 6.4 oz (89.086 kg)   BP 158/69 mmHg  Pulse 56  Temp(Src) 97.5 F (36.4 C)  Resp 16  Ht 5' (1.524 m)  Wt 196 lb 6.4 oz (89.086 kg)  BMI 38.36 kg/m2  LMP 12/01/2015 General Appearance: Well nourished, in no apparent distress. Eyes: PERRLA, EOMs, conjunctiva no swelling or erythema Sinuses: No Frontal/maxillary tenderness ENT/Mouth: Ext aud canals clear, TMs without erythema, bulging. No erythema,  swelling, or exudate on post pharynx.  Tonsils not swollen or erythematous. Hearing normal.  Neck: Supple, thyroid normal.  Respiratory: Respiratory effort normal, BS equal bilaterally without rales, rhonchi, wheezing or stridor.  Cardio: RRR with no MRGs. Brisk peripheral pulses without edema.  Abdomen: Soft, + BS.  Non tender, no guarding, rebound, hernias, masses. Lymphatics: Non tender without lymphadenopathy.  Musculoskeletal: Full ROM, 5/5 strength, normal gait.  Skin: Warm, dry without rashes, lesions, ecchymosis.  Neuro: Cranial  nerves intact. Normal muscle tone, no cerebellar symptoms. Sensation intact.  Psych: Awake and oriented X 3, normal affect, Insight and Judgment appropriate.     Quentin MullingAmanda Savahanna Almendariz, PA-C 3:15 PM [facility]

## 2016-02-08 ENCOUNTER — Ambulatory Visit: Payer: Self-pay | Admitting: Physician Assistant

## 2016-03-04 ENCOUNTER — Other Ambulatory Visit: Payer: Self-pay | Admitting: Internal Medicine

## 2016-03-04 DIAGNOSIS — I10 Essential (primary) hypertension: Secondary | ICD-10-CM

## 2016-03-25 ENCOUNTER — Encounter: Payer: Self-pay | Admitting: Physician Assistant

## 2016-04-13 ENCOUNTER — Other Ambulatory Visit: Payer: Self-pay | Admitting: Internal Medicine

## 2016-04-13 DIAGNOSIS — I10 Essential (primary) hypertension: Secondary | ICD-10-CM

## 2016-04-17 ENCOUNTER — Other Ambulatory Visit: Payer: Self-pay | Admitting: Physician Assistant

## 2016-05-16 ENCOUNTER — Encounter: Payer: Self-pay | Admitting: Physician Assistant

## 2016-05-28 ENCOUNTER — Other Ambulatory Visit: Payer: Self-pay | Admitting: Internal Medicine

## 2016-06-10 ENCOUNTER — Encounter: Payer: Self-pay | Admitting: Internal Medicine

## 2016-06-10 ENCOUNTER — Ambulatory Visit (INDEPENDENT_AMBULATORY_CARE_PROVIDER_SITE_OTHER): Payer: BLUE CROSS/BLUE SHIELD | Admitting: Internal Medicine

## 2016-06-10 VITALS — BP 158/88 | HR 79 | Temp 97.7°F | Wt 195.0 lb

## 2016-06-10 DIAGNOSIS — N920 Excessive and frequent menstruation with regular cycle: Secondary | ICD-10-CM | POA: Diagnosis not present

## 2016-06-10 DIAGNOSIS — Z79899 Other long term (current) drug therapy: Secondary | ICD-10-CM

## 2016-06-10 DIAGNOSIS — I10 Essential (primary) hypertension: Secondary | ICD-10-CM | POA: Diagnosis not present

## 2016-06-10 DIAGNOSIS — Z136 Encounter for screening for cardiovascular disorders: Secondary | ICD-10-CM

## 2016-06-10 DIAGNOSIS — E782 Mixed hyperlipidemia: Secondary | ICD-10-CM | POA: Diagnosis not present

## 2016-06-10 DIAGNOSIS — Z0001 Encounter for general adult medical examination with abnormal findings: Secondary | ICD-10-CM

## 2016-06-10 DIAGNOSIS — D509 Iron deficiency anemia, unspecified: Secondary | ICD-10-CM

## 2016-06-10 DIAGNOSIS — E876 Hypokalemia: Secondary | ICD-10-CM | POA: Diagnosis not present

## 2016-06-10 DIAGNOSIS — E559 Vitamin D deficiency, unspecified: Secondary | ICD-10-CM | POA: Diagnosis not present

## 2016-06-10 DIAGNOSIS — Z23 Encounter for immunization: Secondary | ICD-10-CM | POA: Diagnosis not present

## 2016-06-10 DIAGNOSIS — J45909 Unspecified asthma, uncomplicated: Secondary | ICD-10-CM

## 2016-06-10 DIAGNOSIS — R7303 Prediabetes: Secondary | ICD-10-CM | POA: Diagnosis not present

## 2016-06-10 LAB — LIPID PANEL
CHOLESTEROL: 163 mg/dL (ref 125–200)
HDL: 50 mg/dL (ref >=46)
LDL Cholesterol: 98 mg/dL (ref <130)
Total CHOL/HDL Ratio: 3.3 Ratio (ref <=5.0)
Triglycerides: 76 mg/dL (ref <150)
VLDL: 15 mg/dL (ref <30)

## 2016-06-10 LAB — CBC WITH DIFFERENTIAL/PLATELET
BASOS ABS: 0 {cells}/uL (ref 0–200)
Basophils Relative: 0 %
EOS ABS: 104 {cells}/uL (ref 15–500)
EOS PCT: 2 %
HCT: 28.6 % — ABNORMAL LOW (ref 35.0–45.0)
HEMOGLOBIN: 8.8 g/dL — AB (ref 11.7–15.5)
LYMPHS ABS: 1976 {cells}/uL (ref 850–3900)
Lymphocytes Relative: 38 %
MCH: 22.1 pg — AB (ref 27.0–33.0)
MCHC: 30.8 g/dL — AB (ref 32.0–36.0)
MCV: 71.7 fL — AB (ref 80.0–100.0)
MONOS PCT: 9 %
MPV: 8.7 fL (ref 7.5–12.5)
Monocytes Absolute: 468 cells/uL (ref 200–950)
NEUTROS ABS: 2652 {cells}/uL (ref 1500–7800)
NEUTROS PCT: 51 %
Platelets: 411 10*3/uL — ABNORMAL HIGH (ref 140–400)
RBC: 3.99 MIL/uL (ref 3.80–5.10)
RDW: 16.1 % — ABNORMAL HIGH (ref 11.0–15.0)
WBC: 5.2 10*3/uL (ref 3.8–10.8)

## 2016-06-10 LAB — BASIC METABOLIC PANEL WITH GFR
BUN: 9 mg/dL (ref 7–25)
CALCIUM: 9.2 mg/dL (ref 8.6–10.2)
CHLORIDE: 104 mmol/L (ref 98–110)
CO2: 26 mmol/L (ref 20–31)
CREATININE: 0.67 mg/dL (ref 0.50–1.10)
GFR, Est African American: >89 mL/min (ref >=60)
GFR, Est Non African American: >89 mL/min (ref >=60)
Glucose, Bld: 87 mg/dL (ref 65–99)
Potassium: 3.7 mmol/L (ref 3.5–5.3)
Sodium: 138 mmol/L (ref 135–146)

## 2016-06-10 LAB — HEPATIC FUNCTION PANEL
ALBUMIN: 4 g/dL (ref 3.6–5.1)
ALT: 15 U/L (ref 6–29)
AST: 15 U/L (ref 10–30)
Alkaline Phosphatase: 39 U/L (ref 33–115)
Bilirubin, Direct: 0.1 mg/dL (ref <=0.2)
Indirect Bilirubin: 0.5 mg/dL (ref 0.2–1.2)
TOTAL PROTEIN: 7.3 g/dL (ref 6.1–8.1)
Total Bilirubin: 0.6 mg/dL (ref 0.2–1.2)

## 2016-06-10 LAB — TSH: TSH: 1.42 m[IU]/L

## 2016-06-10 LAB — MAGNESIUM: MAGNESIUM: 1.8 mg/dL (ref 1.5–2.5)

## 2016-06-10 LAB — IRON AND TIBC
%SAT: 7 % — ABNORMAL LOW (ref 11–50)
IRON: 29 ug/dL — AB (ref 40–190)
TIBC: 432 ug/dL (ref 250–450)
UIBC: 403 ug/dL — AB (ref 125–400)

## 2016-06-10 LAB — VITAMIN B12: Vitamin B-12: 467 pg/mL (ref 200–1100)

## 2016-06-10 MED ORDER — AMLODIPINE BESYLATE 5 MG PO TABS: 5.0000 mg | ORAL_TABLET | Freq: Every day | ORAL | 0 refills | Status: DC

## 2016-06-10 NOTE — Patient Instructions (Signed)
Please stop the minoxidil  Please start taking amlodipine once daily at bedtime.  Please continue the benazapril and the bisoprolol HCTZ in the morning.    Please keep a BP log for me for a week before we check back together and write down time and blood pressure.  IF you are still over 150/90 or you drop below 100/60 please call the office.

## 2016-06-10 NOTE — Progress Notes (Signed)
Complete Physical  Assessment and Plan:   1. Encounter for general adult medical examination with abnormal findings  - CBC with Differential/Platelet - BASIC METABOLIC PANEL WITH GFR - Hepatic function panel - Magnesium  2. Essential hypertension -cont benazepril -cont ziac -stop minoxidil -start amlodipine at bedtime -BP log -recheck in 3-4 weeks - Urinalysis, Routine w reflex microscopic (not at [facility]) - Microalbumin / creatinine urine ratio - EKG 12-Lead - TSH  3. Uncomplicated asthma, unspecified asthma severity, unspecified whether persistent -cont albuterol prn -needs to restart dulera -if still shortness of breath with exertion either needs stress testing or possible PFTs and change to breo  4. Hypokalemia -BMET  5. Iron deficiency anemia, unspecified iron deficiency anemia type -discussed options for menorrhagia -patient would like to have ablation so will get BP under control and refer back to gyn - Iron and TIBC - Vitamin B12  6. Mixed hyperlipidemia -cont pravastatin - Lipid panel  7. Menorrhagia with regular cycle  - Iron and TIBC - Vitamin B12  8. Prediabetes -cont metformin -cont diet and exercise - Hemoglobin A1c - Insulin, random  9. Vitamin D deficiency -restart supplement - VITAMIN D 25 Hydroxy (Vit-D Deficiency, Fractures)  10. Medication management -due next year   Discussed med's effects and SE's. Screening labs and tests as requested with regular follow-up as recommended.  HPI  45 y.o. female  presents for a complete physical.  Her blood pressure has not been controlled at home, today their BP is BP: (!) 158/88.  She does not workout. She denies chest pain, shortness of breath, dizziness. She notes that she does get mildly short of breath when she is walking.  She reports that she can carry on a conversation at the same time that she is walking.  She feels mildly tight in her chest like she is wheezing a little bit.  She reports  that this has been just recently.  She notes that at home her blood pressure is running higher than her reading her.    She reports that she has not used albuterol in the last month.  She reports that she just hasn't felt like she has needed it.    She is on cholesterol medication and denies myalgias. Her cholesterol is at goal. The cholesterol last visit was:  Lab Results  Component Value Date   CHOL 179 12/28/2015   HDL 49 12/28/2015   LDLCALC 105 12/28/2015   TRIG 124 12/28/2015   CHOLHDL 3.7 12/28/2015  .  She has been working on diet and exercise for diabetes which has been controlled with oral metformin, she is not on bASA, she is on ACE/ARB and denies foot ulcerations, hyperglycemia, hypoglycemia , increased appetite, nausea, paresthesia of the feet, polydipsia, polyuria, visual disturbances, vomiting and weight loss. Last A1C in the office was:  Lab Results  Component Value Date   HGBA1C 5.3 12/28/2015    Patient is on Vitamin D supplement.   Lab Results  Component Value Date   VD25OH 19 (L) 12/28/2015     She does typically have heavy periods.  She has developed anemia from them in the past.  She has not had a visit with obgyn recently. She reports that she is very tired.  She does not have cramping.  She typically has heavy bleeding for 3 days.   She has spoken with obgyn about ablation in the past but they will not do this until blood pressure is well controlled.    Current Medications:  Current Outpatient Prescriptions on File Prior to Visit  Medication Sig Dispense Refill  . albuterol (PROVENTIL HFA;VENTOLIN HFA) 108 (90 BASE) MCG/ACT inhaler Inhale 2 puffs into the lungs every 6 (six) hours as needed for wheezing or shortness of breath. 1 Inhaler 2  . albuterol (PROVENTIL) (2.5 MG/3ML) 0.083% nebulizer solution Take 3 mLs (2.5 mg total) by nebulization every 6 (six) hours as needed for wheezing or shortness of breath. 75 mL 12  . aspirin-acetaminophen-caffeine (EXCEDRIN  MIGRAINE) 250-250-65 MG tablet Take 1 tablet by mouth every 6 (six) hours as needed for headache.    . benazepril (LOTENSIN) 20 MG tablet TAKE 1 TABLET BY MOUTH EVERY DAY FOR BLOOD PRESSURE AND KIDNEY PROTECTION 90 tablet 0  . benazepril (LOTENSIN) 20 MG tablet TAKE 1 TABLET BY MOUTH EVERY DAY FOR BLOOD PRESSURE AND KIDNEY PROTECTION 90 tablet 0  . bisoprolol-hydrochlorothiazide (ZIAC) 10-6.25 MG tablet Take 1 tablet by mouth daily. 90 tablet 1  . cholecalciferol (VITAMIN D) 1000 UNITS tablet Take 1,000 Units by mouth daily.    . fluticasone (FLONASE) 50 MCG/ACT nasal spray Place 2 sprays into both nostrils daily. (Patient not taking: Reported on 06/10/2016) 16 g 2  . metFORMIN (GLUCOPHAGE-XR) 500 MG 24 hr tablet TAKE 1 TABLET(500 MG) BY MOUTH TWICE DAILY WITH A MEAL 90 tablet 0  . minoxidil (LONITEN) 2.5 MG tablet TAKE 1 TABLET(2.5 MG) BY MOUTH DAILY 14 tablet 0  . mometasone-formoterol (DULERA) 100-5 MCG/ACT AERO Inhale 2 puffs into the lungs 2 (two) times daily. 1 Inhaler 0  . pravastatin (PRAVACHOL) 40 MG tablet TAKE 1 TABLET BY MOUTH EVERY DAY AT BEDTIME FOR CHOLESTEROL 90 tablet 1  . promethazine-dextromethorphan (PROMETHAZINE-DM) 6.25-15 MG/5ML syrup Take 5 mLs by mouth 4 (four) times daily as needed for cough. (Patient not taking: Reported on 06/10/2016) 473 mL 0   No current facility-administered medications on file prior to visit.     Health Maintenance:   Immunization History  Administered Date(s) Administered  . Influenza Split 06/09/2015  . Pneumococcal-Unspecified 08/13/2007  . Td 11/10/2005    Tetanus: 2007, due today Pneumovax: 2009 Flu vaccine: 2016, due today LMP:Oct 18th 2017 Pap2016 MGM: Due, she will make an appointment Colonoscopy: Not indicated at this time Last Eye Exam: Due next month  Patient Care Team: Lucky CowboyWilliam McKeown, MD as PCP - General (Internal Medicine) Shea EvansVaishali Mody, MD as Consulting Physician (Obstetrics and Gynecology) Louis Meckelobert D Kaplan, MD as  Consulting Physician (Gastroenterology) Reece PackerLennis P Livesay, MD as Consulting Physician (Oncology) Kathleene Hazelhristopher D McAlhany, MD as Consulting Physician (Cardiology)  Allergies:  Allergies  Allergen Reactions  . Tussionex Pennkinetic Er [Hydrocod Polst-Cpm Polst Er]     causes headache    Medical History:  Past Medical History:  Diagnosis Date  . Allergic rhinitis   . Asthma   . DM (diabetes mellitus) (HCC)    borderline  . High cholesterol   . Hypertension   . Iron deficiency anemia   . Low blood potassium     Surgical History:  Past Surgical History:  Procedure Laterality Date  . CESAREAN SECTION     x 4  . TUBAL LIGATION  1996  . UMBILICAL HERNIA REPAIR  1977    Family History:  Family History  Problem Relation Age of Onset  . Breast cancer Maternal Aunt   . Diabetes Mother   . Stroke Mother   . Mitral valve prolapse Mother   . Diabetes Maternal Aunt   . Heart disease Maternal Grandmother     great  grandmother  . CAD Neg Hx   . Diabetes Father     Social History:  Social History  Substance Use Topics  . Smoking status: Never Smoker  . Smokeless tobacco: Never Used  . Alcohol use No    Review of Systems: Review of Systems  Constitutional: Negative for chills, fever and malaise/fatigue.  HENT: Negative for congestion, ear pain and sore throat.   Eyes: Negative.   Respiratory: Negative for cough, shortness of breath and wheezing.   Cardiovascular: Negative for chest pain, palpitations and leg swelling.  Gastrointestinal: Negative for abdominal pain, blood in stool, constipation, diarrhea, heartburn and melena.  Genitourinary: Negative.   Skin: Negative.   Neurological: Negative for dizziness, sensory change, loss of consciousness and headaches.  Psychiatric/Behavioral: Negative for depression. The patient is not nervous/anxious and does not have insomnia.     Physical Exam: Estimated body mass index is 38.08 kg/m as calculated from the following:    Height as of 01/11/16: 5' (1.524 m).   Weight as of this encounter: 195 lb (88.5 kg). BP (!) 158/88   Pulse 79   Temp 97.7 F (36.5 C)   Wt 195 lb (88.5 kg)   LMP 05/29/2016 (Approximate)   SpO2 96%   BMI 38.08 kg/m   General Appearance: Well nourished well developed, in no apparent distress.  Eyes: PERRLA, EOMs, conjunctiva no swelling or erythema ENT/Mouth: Ear canals normal without obstruction, swelling, erythema, or discharge.  TMs normal bilaterally with no erythema, bulging, retraction, or loss of landmark.  Oropharynx moist and clear with no exudate, erythema, or swelling.   Neck: Supple, thyroid normal. No bruits.  No cervical adenopathy Respiratory: Respiratory effort normal, Breath sounds clear A&P without wheeze, rhonchi, rales.   Cardio: RRR without murmurs, rubs or gallops. Brisk peripheral pulses without edema.  Chest: symmetric, with normal excursions Breasts: Pendulous, fibrocystic breasts that are Symmetric, without lumps, nipple discharge, retractions. No axillary lymphadenopathy.   Abdomen: Soft, nontender, no guarding, rebound, hernias, masses, or organomegaly.  Lymphatics: Non tender without lymphadenopathy.  Musculoskeletal: Full ROM all peripheral extremities,5/5 strength, and normal gait.  Skin: Warm, dry without rashes, lesions, ecchymosis. Neuro: Awake and oriented X 3, Cranial nerves intact, reflexes equal bilaterally. Normal muscle tone, no cerebellar symptoms. Sensation intact.  Psych:  normal affect, Insight and Judgment appropriate.   EKG: WNL no changes.  Over 40 minutes of exam, counseling, chart review and critical decision making was performed  Toni Amendourtney Forcucci 10:08 AM [facility]

## 2016-06-10 NOTE — Addendum Note (Signed)
Addended by: Rodney BoozeUFF, Michon Kaczmarek D on: 06/10/2016 12:46 PM   Modules accepted: Orders

## 2016-06-11 LAB — HEMOGLOBIN A1C
Hgb A1c MFr Bld: 5.3 % (ref <5.7)
Mean Plasma Glucose: 105 mg/dL

## 2016-06-11 LAB — URINALYSIS, ROUTINE W REFLEX MICROSCOPIC
Bilirubin Urine: NEGATIVE
Glucose, UA: NEGATIVE
Hgb urine dipstick: NEGATIVE
Ketones, ur: NEGATIVE
Leukocytes, UA: NEGATIVE
Nitrite: NEGATIVE
Protein, ur: NEGATIVE
Specific Gravity, Urine: 1.019 (ref 1.001–1.035)
pH: 6.5 (ref 5.0–8.0)

## 2016-06-11 LAB — MICROALBUMIN / CREATININE URINE RATIO
CREATININE, URINE: 167 mg/dL (ref 20–320)
Microalb Creat Ratio: 27 mcg/mg creat (ref <30)
Microalb, Ur: 4.5 mg/dL

## 2016-06-11 LAB — INSULIN, RANDOM: Insulin: 7.3 u[IU]/mL (ref 2.0–19.6)

## 2016-06-11 LAB — VITAMIN D 25 HYDROXY (VIT D DEFICIENCY, FRACTURES): VIT D 25 HYDROXY: 21 ng/mL — AB (ref 30–100)

## 2016-06-14 ENCOUNTER — Encounter: Payer: Self-pay | Admitting: Internal Medicine

## 2016-07-09 ENCOUNTER — Other Ambulatory Visit: Payer: Self-pay | Admitting: Internal Medicine

## 2016-07-09 DIAGNOSIS — I10 Essential (primary) hypertension: Secondary | ICD-10-CM

## 2016-07-09 DIAGNOSIS — E785 Hyperlipidemia, unspecified: Secondary | ICD-10-CM

## 2016-07-16 ENCOUNTER — Ambulatory Visit: Payer: Self-pay | Admitting: Internal Medicine

## 2016-07-21 ENCOUNTER — Other Ambulatory Visit: Payer: Self-pay | Admitting: Internal Medicine

## 2016-07-21 DIAGNOSIS — I10 Essential (primary) hypertension: Secondary | ICD-10-CM

## 2016-07-30 ENCOUNTER — Ambulatory Visit (INDEPENDENT_AMBULATORY_CARE_PROVIDER_SITE_OTHER): Payer: BLUE CROSS/BLUE SHIELD | Admitting: Internal Medicine

## 2016-07-30 ENCOUNTER — Encounter: Payer: Self-pay | Admitting: Internal Medicine

## 2016-07-30 DIAGNOSIS — I10 Essential (primary) hypertension: Secondary | ICD-10-CM | POA: Diagnosis not present

## 2016-07-30 MED ORDER — AMLODIPINE BESYLATE 10 MG PO TABS: 10.0000 mg | ORAL_TABLET | Freq: Every day | ORAL | 0 refills | Status: DC

## 2016-07-30 MED ORDER — BENAZEPRIL HCL 40 MG PO TABS: 40.0000 mg | ORAL_TABLET | Freq: Every day | ORAL | 1 refills | Status: DC

## 2016-07-30 NOTE — Progress Notes (Signed)
Assessment and Plan:   1. Essential hypertension -patient was non-compliant with medication -she did send mychart message and BP is now 165/93 after 1 day of amlodipine 10 mg -patient to send daily mychart messages -patient with no red flags for Hypertensive emergency at this time -recheck in 1 month -cont ziac 10/6.25 - benazepril (LOTENSIN) 40 MG tablet; Take 1 tablet (40 mg total) by mouth daily.  Dispense: 90 tablet; Refill: 1     HPI 45 y.o.female presents for 1 month follow up of Hypertension.  She has not been taking the amlodipine since she ran out.  She was down to 160.70.  She was not taking the blood pressure regularly.  She did not get any readings less than 160.  She reports that she did not have any side effects form the medication.  She . Patient reports that they have been doing well.  female is not taking their medication.  They are having difficulty with their medications.  They report no adverse reactions.    Past Medical History:  Diagnosis Date  . Allergic rhinitis   . Asthma   . DM (diabetes mellitus) (HCC)    borderline  . High cholesterol   . Hypertension   . Iron deficiency anemia   . Low blood potassium      Allergies  Allergen Reactions  . Tussionex Pennkinetic Er [Hydrocod Polst-Cpm Polst Er]     causes headache      Current Outpatient Prescriptions on File Prior to Visit  Medication Sig Dispense Refill  . albuterol (PROVENTIL HFA;VENTOLIN HFA) 108 (90 BASE) MCG/ACT inhaler Inhale 2 puffs into the lungs every 6 (six) hours as needed for wheezing or shortness of breath. 1 Inhaler 2  . benazepril (LOTENSIN) 20 MG tablet TAKE 1 TABLET BY MOUTH EVERY DAY FOR BLOOD PRESSURE AND KIDNEY PROTECTION 90 tablet 0  . bisoprolol-hydrochlorothiazide (ZIAC) 10-6.25 MG tablet TAKE 1 TABLET BY MOUTH DAILY 90 tablet 0  . fluticasone (FLONASE) 50 MCG/ACT nasal spray Place 2 sprays into both nostrils daily. 16 g 2  . metFORMIN (GLUCOPHAGE-XR) 500 MG 24 hr tablet  TAKE 1 TABLET(500 MG) BY MOUTH TWICE DAILY WITH A MEAL 90 tablet 0  . pravastatin (PRAVACHOL) 40 MG tablet TAKE 1 TABLET BY MOUTH EVERY DAY AT BEDTIME FOR CHOLESTEROL 90 tablet 0  . amLODipine (NORVASC) 5 MG tablet Take 1 tablet (5 mg total) by mouth daily. (Patient not taking: Reported on 07/30/2016) 30 tablet 0  . mometasone-formoterol (DULERA) 100-5 MCG/ACT AERO Inhale 2 puffs into the lungs 2 (two) times daily. 1 Inhaler 0   No current facility-administered medications on file prior to visit.     ROS: all negative except above.   Physical Exam: Filed Weights   07/30/16 1559  Weight: 196 lb (88.9 kg)   BP (!) 182/100   Pulse 66   Temp 98.2 F (36.8 C) (Temporal)   Ht 5' (1.524 m)   Wt 196 lb (88.9 kg)   LMP 07/26/2016   BMI 38.28 kg/m  General Appearance: Well developed well nourished, non-toxic appearing in no apparent distress. Eyes: PERRLA, EOMs, conjunctiva w/ no swelling or erythema or discharge Sinuses: No Frontal/maxillary tenderness ENT/Mouth: Ear canals clear without swelling or erythema.  TM's normal bilaterally with no retractions, bulging, or loss of landmarks.   Neck: Supple, thyroid normal, no notable JVD  Respiratory: Respiratory effort normal, Clear breath sounds anteriorly and posteriorly bilaterally without rales, rhonchi, wheezing or stridor. No retractions or accessory muscle usage. Cardio: RRR  with no MRGs.   Abdomen: Soft, + BS.  Non tender, no guarding, rebound, hernias, masses.  Musculoskeletal: Full ROM, 5/5 strength, normal gait.  Skin: Warm, dry without rashes  Neuro: Awake and oriented X 3, Cranial nerves intact. Normal muscle tone, no cerebellar symptoms. Sensation intact.  Psych: normal affect, Insight and Judgment appropriate.     Terri Piedraourtney Forcucci, PA-C 4:20 PM [facility]

## 2016-08-01 ENCOUNTER — Encounter: Payer: Self-pay | Admitting: Internal Medicine

## 2016-08-14 ENCOUNTER — Other Ambulatory Visit: Payer: Self-pay | Admitting: Internal Medicine

## 2016-09-03 ENCOUNTER — Encounter: Payer: Self-pay | Admitting: Internal Medicine

## 2016-09-03 ENCOUNTER — Ambulatory Visit (INDEPENDENT_AMBULATORY_CARE_PROVIDER_SITE_OTHER): Payer: BLUE CROSS/BLUE SHIELD | Admitting: Internal Medicine

## 2016-09-03 VITALS — BP 170/100 | HR 60 | Temp 98.2°F | Resp 16 | Ht 60.0 in | Wt 195.0 lb

## 2016-09-03 DIAGNOSIS — I10 Essential (primary) hypertension: Secondary | ICD-10-CM | POA: Diagnosis not present

## 2016-09-03 MED ORDER — PREDNISONE 20 MG PO TABS: ORAL_TABLET | ORAL | 0 refills | Status: DC

## 2016-09-03 MED ORDER — CLONIDINE HCL 0.2 MG/24HR TD PTWK: 0.2000 mg | MEDICATED_PATCH | TRANSDERMAL | 11 refills | Status: DC

## 2016-09-03 NOTE — Patient Instructions (Signed)

## 2016-09-03 NOTE — Progress Notes (Signed)
Assessment and Plan:   1. Essential hypertension -cont amlodipine at bedtime -benazepril and ziac in the morning -add in clonidine 0.2 mg patch/week -recheck BP in 1 week and send mychart -go to ER if weakness, severe headache, cp, or decreased urine output.    2.  Left rotator cuff tendonitis -prednisone -rehab exercises given -avoid NSAIDs due to BP issues  HPI 46 y.o.female presents for 1 month follow up of essential hypertension which is very poorly controlled.  Patient has BP today of 170/100.  She is getting blood pressures ranging anywhere between 141/76-167/90.  She is taking her medications.  She reports no side effects.  She is doing okay with her medication routine.  She has no headaches, blurry vision, chest pain, palpitations, shortness of breath wheezing or decreased urine output.    She is also having some left shoulder pain.  She reports that it really bothers her to lift her arm above her head.  She has been taking advil for this occasionally.  No injury she can think of.  Has been worsening over the last month.  She has no relieving factors.  She is right handed.    Past Medical History:  Diagnosis Date  . Allergic rhinitis   . Asthma   . DM (diabetes mellitus) (HCC)    borderline  . High cholesterol   . Hypertension   . Iron deficiency anemia   . Low blood potassium      Allergies  Allergen Reactions  . Tussionex Pennkinetic Er [Hydrocod Polst-Cpm Polst Er]     causes headache      Current Outpatient Prescriptions on File Prior to Visit  Medication Sig Dispense Refill  . albuterol (PROVENTIL HFA;VENTOLIN HFA) 108 (90 BASE) MCG/ACT inhaler Inhale 2 puffs into the lungs every 6 (six) hours as needed for wheezing or shortness of breath. 1 Inhaler 2  . amLODipine (NORVASC) 10 MG tablet Take 1 tablet (10 mg total) by mouth daily. 90 tablet 0  . benazepril (LOTENSIN) 40 MG tablet Take 1 tablet (40 mg total) by mouth daily. 90 tablet 1  .  bisoprolol-hydrochlorothiazide (ZIAC) 10-6.25 MG tablet TAKE 1 TABLET BY MOUTH DAILY 90 tablet 0  . fluticasone (FLONASE) 50 MCG/ACT nasal spray Place 2 sprays into both nostrils daily. 16 g 2  . metFORMIN (GLUCOPHAGE-XR) 500 MG 24 hr tablet TAKE 1 TABLET(500 MG) BY MOUTH TWICE DAILY WITH A MEAL 60 tablet 0  . mometasone-formoterol (DULERA) 100-5 MCG/ACT AERO Inhale 2 puffs into the lungs 2 (two) times daily. 1 Inhaler 0  . pravastatin (PRAVACHOL) 40 MG tablet TAKE 1 TABLET BY MOUTH EVERY DAY AT BEDTIME FOR CHOLESTEROL 90 tablet 0   No current facility-administered medications on file prior to visit.     ROS: all negative except above.   Physical Exam: Filed Weights   09/03/16 0937  Weight: 195 lb (88.5 kg)   BP (!) 170/100   Pulse 60   Temp 98.2 F (36.8 C) (Temporal)   Resp 16   Ht 5' (1.524 m)   Wt 195 lb (88.5 kg)   LMP 08/27/2016   BMI 38.08 kg/m  General Appearance: Well developed well nourished, non-toxic appearing in no apparent distress. Eyes: PERRLA, EOMs, conjunctiva w/ no swelling or erythema or discharge Sinuses: No Frontal/maxillary tenderness ENT/Mouth: Ear canals clear without swelling or erythema.  TM's normal bilaterally with no retractions, bulging, or loss of landmarks.   Neck: Supple, thyroid normal, no notable JVD  Respiratory: Respiratory effort normal, Clear breath  sounds anteriorly and posteriorly bilaterally without rales, rhonchi, wheezing or stridor. No retractions or accessory muscle usage. Cardio: RRR with no MRGs.   Abdomen: Soft, + BS.  Non tender, no guarding, rebound, hernias, masses.  Musculoskeletal: Positive empty can, positive hawkins kennedy, negative speeds test, negative lift off.  Full ROM, 5/5 strength, normal gait.  Skin: Warm, dry without rashes  Neuro: Awake and oriented X 3, Cranial nerves intact. Normal muscle tone, no cerebellar symptoms. Sensation intact.  Psych: normal affect, Insight and Judgment appropriate.     Terri Piedra, PA-C 9:49 AM [facility]

## 2016-09-20 ENCOUNTER — Other Ambulatory Visit: Payer: Self-pay | Admitting: Internal Medicine

## 2016-10-21 ENCOUNTER — Other Ambulatory Visit: Payer: Self-pay | Admitting: Physician Assistant

## 2016-10-21 ENCOUNTER — Other Ambulatory Visit: Payer: Self-pay | Admitting: Internal Medicine

## 2016-10-21 DIAGNOSIS — I10 Essential (primary) hypertension: Secondary | ICD-10-CM

## 2016-10-21 DIAGNOSIS — E785 Hyperlipidemia, unspecified: Secondary | ICD-10-CM

## 2016-11-06 ENCOUNTER — Other Ambulatory Visit: Payer: Self-pay | Admitting: Internal Medicine

## 2016-12-12 NOTE — Progress Notes (Signed)
Assessment and Plan:   Hypertension -Continue medication, monitor blood pressure at home. Continue DASH diet.  Reminder to go to the ER if any CP, SOB, nausea, dizziness, severe HA, changes vision/speech, left arm numbness and tingling and jaw pain. She is on amlodipine, benazepril and ziac, add on low dose HCTZ Will monitor potassium/kidney follow up 1 month  Obesity Obesity with co morbidities- long discussion about weight loss, diet, and exercise   Iron deficiency anemia, unspecified Continue iron/vit C, check labs Willing to start on low dose BCP with iron Add low dose asa with estrogen Follow up GYN  Follow up 1 month  Continue diet and meds as discussed. Further disposition pending results of labs. Over 30 minutes of exam, counseling, chart review, and critical decision making was performed  HPI 46 y.o. female  presents for 3 month follow up on hypertension, cholesterol, prediabetes, and vitamin D deficiency.   Her blood pressure has been controlled at home, could not tolerate the clonidine patch, has been off the patch, today their BP is BP: (!) 120/92  She also had low iron/CBC, will recheck that today, has been on one at night fusion. Denies black stools, blood in stool.  Has very heavy menses, has not been to GYN.  Lab Results  Component Value Date   IRON 29 (L) 06/10/2016   TIBC 432 06/10/2016   FERRITIN 12 09/12/2015   Lab Results  Component Value Date   WBC 5.2 06/10/2016   HGB 8.8 (L) 06/10/2016   HCT 28.6 (L) 06/10/2016   MCV 71.7 (L) 06/10/2016   PLT 411 (H) 06/10/2016    She does not workout. She denies chest pain, dizziness. She has asthma, she is just on xopenix, no maintenence inhaler, has been worse x 2 weeks.  She is on cholesterol medication and denies myalgias. Her cholesterol is at goal. The cholesterol last visit was:   Lab Results  Component Value Date   CHOL 163 06/10/2016   HDL 50 06/10/2016   LDLCALC 98 06/10/2016   TRIG 76 06/10/2016   CHOLHDL 3.3 06/10/2016    She has been working on diet and exercise for prediabetes, she is on metformin for insulin resistance and denies paresthesia of the feet, polydipsia, polyuria and visual disturbances. Last A1C in the office was:  Lab Results  Component Value Date   HGBA1C 5.3 06/10/2016   Patient is on Vitamin D supplement.   Lab Results  Component Value Date   VD25OH 21 (L) 06/10/2016     BMI is Body mass index is 38.47 kg/m., she is working on diet and exercise. Wt Readings from Last 3 Encounters:  12/13/16 197 lb (89.4 kg)  09/03/16 195 lb (88.5 kg)  07/30/16 196 lb (88.9 kg)     Current Medications:  Current Outpatient Prescriptions on File Prior to Visit  Medication Sig Dispense Refill  . albuterol (PROVENTIL HFA;VENTOLIN HFA) 108 (90 BASE) MCG/ACT inhaler Inhale 2 puffs into the lungs every 6 (six) hours as needed for wheezing or shortness of breath. 1 Inhaler 2  . amLODipine (NORVASC) 10 MG tablet TAKE 1 TABLET(10 MG) BY MOUTH DAILY 90 tablet 0  . benazepril (LOTENSIN) 40 MG tablet Take 1 tablet (40 mg total) by mouth daily. 90 tablet 1  . bisoprolol-hydrochlorothiazide (ZIAC) 10-6.25 MG tablet TAKE 1 TABLET BY MOUTH DAILY 90 tablet 0  . cloNIDine (CATAPRES - DOSED IN MG/24 HR) 0.2 mg/24hr patch Place 1 patch (0.2 mg total) onto the skin every 7 (seven) days.  4 patch 11  . Iron-FA-B Cmp-C-Biot-Probiotic (FUSION PLUS) CAPS TAKE 1 CAPSULE BY MOUTH FOR IRON DEF ANEMIA 30 capsule 0  . metFORMIN (GLUCOPHAGE-XR) 500 MG 24 hr tablet Take 1 to 2 tablets 2 x / day with a meal for Diabetes 360 tablet 1  . pravastatin (PRAVACHOL) 40 MG tablet TAKE 1 TABLET BY MOUTH EVERY DAY AT BEDTIME FOR CHOLESTEROL 90 tablet 0  . fluticasone (FLONASE) 50 MCG/ACT nasal spray Place 2 sprays into both nostrils daily. 16 g 2   No current facility-administered medications on file prior to visit.    Medical History:  Past Medical History:  Diagnosis Date  . Allergic rhinitis   . Asthma   .  DM (diabetes mellitus) (HCC)    borderline  . High cholesterol   . Hypertension   . Iron deficiency anemia   . Low blood potassium    Allergies:  Allergies  Allergen Reactions  . Tussionex Pennkinetic Er [Hydrocod Polst-Cpm Polst Er]     causes headache     Review of Systems:  Review of Systems  Constitutional: Negative for chills, diaphoresis, fever, malaise/fatigue and weight loss.  HENT: Negative for congestion, ear discharge, ear pain, hearing loss, nosebleeds, sore throat and tinnitus.   Eyes: Negative.   Respiratory: Negative for cough, hemoptysis, sputum production, shortness of breath, wheezing and stridor.   Cardiovascular: Negative.  Negative for chest pain.  Gastrointestinal: Negative.   Genitourinary: Negative.   Musculoskeletal: Negative.   Skin: Negative.   Neurological: Negative.  Negative for weakness and headaches.  Psychiatric/Behavioral: Negative.     Family history- Review and unchanged Social history- Review and unchanged Physical Exam: BP (!) 120/92   Pulse 61   Temp 97.5 F (36.4 C)   Resp 14   Ht 5' (1.524 m)   Wt 197 lb (89.4 kg)   LMP 12/12/2016   SpO2 98%   BMI 38.47 kg/m  Wt Readings from Last 3 Encounters:  12/13/16 197 lb (89.4 kg)  09/03/16 195 lb (88.5 kg)  07/30/16 196 lb (88.9 kg)   General Appearance: Well nourished, in no apparent distress. Eyes: PERRLA, EOMs, conjunctiva no swelling or erythema Sinuses: No Frontal/maxillary tenderness ENT/Mouth: Ext aud canals clear, TMs without erythema, bulging. No erythema, swelling, or exudate on post pharynx.  Tonsils not swollen or erythematous. Hearing normal.  Neck: Supple, thyroid normal.  Respiratory: Respiratory effort normal, decreased breath sounds bilaterally with mild expiratory wheeze without rales, rhonchi, or stridor.  Cardio: RRR with no MRGs. Brisk peripheral pulses without edema.  Abdomen: Soft, + BS,  Non tender, no guarding, rebound, hernias, masses. Lymphatics: Non  tender without lymphadenopathy.  Musculoskeletal: Full ROM, 5/5 strength, Normal gait Skin: Warm, dry without rashes, lesions, ecchymosis.  Neuro: Cranial nerves intact. Normal muscle tone, no cerebellar symptoms. Psych: Awake and oriented X 3, normal affect, Insight and Judgment appropriate.    Quentin MullingAmanda Ayani Ospina, PA-C 10:52 AM [facility]

## 2016-12-13 ENCOUNTER — Ambulatory Visit: Payer: Self-pay | Admitting: Internal Medicine

## 2016-12-13 ENCOUNTER — Encounter: Payer: Self-pay | Admitting: Physician Assistant

## 2016-12-13 ENCOUNTER — Ambulatory Visit (INDEPENDENT_AMBULATORY_CARE_PROVIDER_SITE_OTHER): Payer: BLUE CROSS/BLUE SHIELD | Admitting: Physician Assistant

## 2016-12-13 VITALS — BP 120/92 | HR 61 | Temp 97.5°F | Resp 14 | Ht 60.0 in | Wt 197.0 lb

## 2016-12-13 DIAGNOSIS — E559 Vitamin D deficiency, unspecified: Secondary | ICD-10-CM

## 2016-12-13 DIAGNOSIS — Z79899 Other long term (current) drug therapy: Secondary | ICD-10-CM | POA: Diagnosis not present

## 2016-12-13 DIAGNOSIS — R7303 Prediabetes: Secondary | ICD-10-CM

## 2016-12-13 DIAGNOSIS — D509 Iron deficiency anemia, unspecified: Secondary | ICD-10-CM | POA: Diagnosis not present

## 2016-12-13 DIAGNOSIS — I10 Essential (primary) hypertension: Secondary | ICD-10-CM

## 2016-12-13 DIAGNOSIS — E782 Mixed hyperlipidemia: Secondary | ICD-10-CM

## 2016-12-13 LAB — CBC WITH DIFFERENTIAL/PLATELET
BASOS ABS: 0 {cells}/uL (ref 0–200)
Basophils Relative: 0 %
EOS ABS: 192 {cells}/uL (ref 15–500)
EOS PCT: 4 %
HCT: 35.6 % (ref 35.0–45.0)
HEMOGLOBIN: 11.7 g/dL (ref 11.7–15.5)
Lymphocytes Relative: 40 %
Lymphs Abs: 1920 cells/uL (ref 850–3900)
MCH: 26.6 pg — ABNORMAL LOW (ref 27.0–33.0)
MCHC: 32.9 g/dL (ref 32.0–36.0)
MCV: 80.9 fL (ref 80.0–100.0)
MONOS PCT: 12 %
MPV: 9.2 fL (ref 7.5–12.5)
Monocytes Absolute: 576 cells/uL (ref 200–950)
NEUTROS PCT: 44 %
Neutro Abs: 2112 cells/uL (ref 1500–7800)
Platelets: 277 10*3/uL (ref 140–400)
RBC: 4.4 MIL/uL (ref 3.80–5.10)
RDW: 13 % (ref 11.0–15.0)
WBC: 4.8 10*3/uL (ref 3.8–10.8)

## 2016-12-13 MED ORDER — NORETHIN-ETH ESTRAD-FE BIPHAS 1 MG-10 MCG / 10 MCG PO TABS: 1.0000 | ORAL_TABLET | Freq: Every day | ORAL | 3 refills | Status: DC

## 2016-12-13 MED ORDER — HYDROCHLOROTHIAZIDE 25 MG PO TABS: 25.0000 mg | ORAL_TABLET | Freq: Every day | ORAL | 3 refills | Status: DC

## 2016-12-13 NOTE — Patient Instructions (Addendum)
Follow up GYN Take fusion plus twice a day  Take HCTZ 12.5mg  daily Get lite salt and use 1-2 teaspoons a day Start on BCP  Potassium Content of Foods  The body needs potassium to control blood pressure and to keep the muscles and nervous system healthy. Here are some healthy foods below that are high in potassium. Also you can get the white label salt of "NO SALT" salt substitute, 1/4 teaspoon of this is equivalent to potassium.   FOODS AND DRINKS HIGH IN POTASSIUM FOODS MODERATE IN POTASSIUM   Fruits  Avocado (cubed),  c / 50 g.  Banana (sliced), 75 g.  Cantaloupe (cubed), 80 g.  Honeydew, 1 wedge / 85 g.  Kiwi (sliced), 90 g.  Nectarine, 1 small / 129 g.  Orange, 1 medium / 131 g. Vegetables  Artichoke,  of a medium / 64 g.  Asparagus (boiled), 90 g..  Broccoli (boiled), 78 g.  Brussels sprout (boiled), 78 g.  Butternut squash (baked), 103 g.  Chickpea (cooked), 82 g.  Green peas (cooked), 80 g.  Kidney beans (cooked), 5 tbsp / 55 g.  Lima beans (cooked),  c / 43 g.  Navy beans (cooked),  c / 61 g.  Spinach (cooked),  c / 45 g.  Sweet potato (baked),  c / 50 g.  Tomato (chopped or sliced), 90 g.  Vegetable juice.  White mushrooms (cooked), 78 g.  Yam (cooked or baked),  c / 34 g.  Zucchini squash (boiled), 90 g. Other Foods and Drinks  Almonds (whole),  c / 36 g.  Fish, 3 oz / 85 g.  Nonfat fruit variety yogurt, 123 g.  Pistachio nuts, 1 oz / 28 g.  Pumpkin seeds, 1 oz / 28 g.  Red meat (broiled, cooked, grilled), 3 oz / 85 g.  Scallops (steamed), 3 oz / 85 g.  Spaghetti sauce,  c / 66 g.  Sunflower seeds (dry roasted), 1 oz / 28 g.  Veggie burger, 1 patty / 70 g. Fruits  Grapefruit,  of the fruit / 123 g  Plums (sliced), 83 g.  Tangerine, 1 large / 120 g. Vegetables  Carrots (boiled), 78 g.  Carrots (sliced), 61 g.  Rhubarb (cooked with sugar), 120 g.  Rutabaga (cooked), 120 g.  Yellow snap beans  (cooked), 63 g. Other Foods and Drinks   Chicken breast (roasted and chopped),  c / 70 g.  Pita bread, 1 large / 64 g.  Shrimp (steamed), 4 oz / 113 g.  Swiss cheese (diced), 70 g.     Anemia, Nonspecific Anemia is a condition in which the concentration of red blood cells or hemoglobin in the blood is below normal. Hemoglobin is a substance in red blood cells that carries oxygen to the tissues of the body. Anemia results in not enough oxygen reaching these tissues. What are the causes? Common causes of anemia include:  Excessive bleeding. Bleeding may be internal or external. This includes excessive bleeding from periods (in women) or from the intestine.  Poor nutrition.  Chronic kidney, thyroid, and liver disease.  Bone marrow disorders that decrease red blood cell production.  Cancer and treatments for cancer.  HIV, AIDS, and their treatments.  Spleen problems that increase red blood cell destruction.  Blood disorders.  Excess destruction of red blood cells due to infection, medicines, and autoimmune disorders. What are the signs or symptoms?  Minor weakness.  Dizziness.  Headache.  Palpitations.  Shortness of breath, especially with exercise.  Paleness.  Cold sensitivity.  Indigestion.  Nausea.  Difficulty sleeping.  Difficulty concentrating. Symptoms may occur suddenly or they may develop slowly. How is this diagnosed? Additional blood tests are often needed. These help your health care provider determine the best treatment. Your health care provider will check your stool for blood and look for other causes of blood loss. How is this treated? Treatment varies depending on the cause of the anemia. Treatment can include:  Supplements of iron, vitamin B12, or folic acid.  Hormone medicines.  A blood transfusion. This may be needed if blood loss is severe.  Hospitalization. This may be needed if there is significant continual blood  loss.  Dietary changes.  Spleen removal. Follow these instructions at home: Keep all follow-up appointments. It often takes many weeks to correct anemia, and having your health care provider check on your condition and your response to treatment is very important. Get help right away if:  You develop extreme weakness, shortness of breath, or chest pain.  You become dizzy or have trouble concentrating.  You develop heavy vaginal bleeding.  You develop a rash.  You have bloody or black, tarry stools.  You faint.  You vomit up blood.  You vomit repeatedly.  You have abdominal pain.  You have a fever or persistent symptoms for more than 2-3 days.  You have a fever and your symptoms suddenly get worse.  You are dehydrated. This information is not intended to replace advice given to you by your health care provider. Make sure you discuss any questions you have with your health care provider. Document Released: 09/05/2004 Document Revised: 01/10/2016 Document Reviewed: 01/22/2013 [employer]vier Interactive Patient Education  2017 [employer]

## 2016-12-14 LAB — BASIC METABOLIC PANEL WITH GFR
BUN: 5 mg/dL — ABNORMAL LOW (ref 7–25)
CALCIUM: 9 mg/dL (ref 8.6–10.2)
CO2: 27 mmol/L (ref 20–31)
Chloride: 103 mmol/L (ref 98–110)
Creat: 0.61 mg/dL (ref 0.50–1.10)
Glucose, Bld: 92 mg/dL (ref 65–99)
POTASSIUM: 3.5 mmol/L (ref 3.5–5.3)
SODIUM: 139 mmol/L (ref 135–146)

## 2016-12-14 LAB — MAGNESIUM: Magnesium: 1.9 mg/dL (ref 1.5–2.5)

## 2016-12-14 LAB — IRON AND TIBC
%SAT: 13 % (ref 11–50)
IRON: 45 ug/dL (ref 40–190)
TIBC: 344 ug/dL (ref 250–450)
UIBC: 299 ug/dL (ref 125–400)

## 2017-02-06 ENCOUNTER — Other Ambulatory Visit: Payer: Self-pay | Admitting: Internal Medicine

## 2017-02-07 ENCOUNTER — Other Ambulatory Visit: Payer: Self-pay

## 2017-02-07 DIAGNOSIS — I10 Essential (primary) hypertension: Secondary | ICD-10-CM

## 2017-02-07 MED ORDER — BENAZEPRIL HCL 40 MG PO TABS: 40.0000 mg | ORAL_TABLET | Freq: Every day | ORAL | 1 refills | Status: DC

## 2017-02-10 ENCOUNTER — Other Ambulatory Visit: Payer: Self-pay | Admitting: Internal Medicine

## 2017-02-10 DIAGNOSIS — E785 Hyperlipidemia, unspecified: Secondary | ICD-10-CM

## 2017-02-10 DIAGNOSIS — I10 Essential (primary) hypertension: Secondary | ICD-10-CM

## 2017-02-14 ENCOUNTER — Encounter: Payer: Self-pay | Admitting: Physician Assistant

## 2017-02-17 MED ORDER — NORETHIN ACE-ETH ESTRAD-FE 1-20 MG-MCG PO TABS: 1.0000 | ORAL_TABLET | Freq: Every day | ORAL | 11 refills | Status: DC

## 2017-02-25 ENCOUNTER — Encounter: Payer: Self-pay | Admitting: Physician Assistant

## 2017-02-26 MED ORDER — NORGESTIMATE-ETH ESTRADIOL 0.25-35 MG-MCG PO TABS: 1.0000 | ORAL_TABLET | Freq: Every day | ORAL | 11 refills | Status: DC

## 2017-03-26 ENCOUNTER — Encounter: Payer: Self-pay | Admitting: Physician Assistant

## 2017-03-26 MED ORDER — ALBUTEROL SULFATE HFA 108 (90 BASE) MCG/ACT IN AERS: 2.0000 | INHALATION_SPRAY | Freq: Four times a day (QID) | RESPIRATORY_TRACT | 2 refills | Status: DC | PRN

## 2017-04-21 ENCOUNTER — Other Ambulatory Visit: Payer: Self-pay | Admitting: Physician Assistant

## 2017-04-21 DIAGNOSIS — I10 Essential (primary) hypertension: Secondary | ICD-10-CM

## 2017-05-06 ENCOUNTER — Other Ambulatory Visit: Payer: Self-pay | Admitting: Internal Medicine

## 2017-05-06 DIAGNOSIS — E785 Hyperlipidemia, unspecified: Secondary | ICD-10-CM

## 2017-05-23 ENCOUNTER — Other Ambulatory Visit: Payer: Self-pay | Admitting: Internal Medicine

## 2017-06-10 ENCOUNTER — Encounter: Payer: Self-pay | Admitting: Internal Medicine

## 2017-06-11 ENCOUNTER — Ambulatory Visit (INDEPENDENT_AMBULATORY_CARE_PROVIDER_SITE_OTHER): Payer: BLUE CROSS/BLUE SHIELD | Admitting: Physician Assistant

## 2017-06-11 ENCOUNTER — Encounter: Payer: Self-pay | Admitting: Physician Assistant

## 2017-06-11 VITALS — BP 126/80 | HR 85 | Temp 97.7°F | Resp 14 | Ht 60.0 in | Wt 193.6 lb

## 2017-06-11 DIAGNOSIS — Z136 Encounter for screening for cardiovascular disorders: Secondary | ICD-10-CM | POA: Diagnosis not present

## 2017-06-11 DIAGNOSIS — R6889 Other general symptoms and signs: Secondary | ICD-10-CM

## 2017-06-11 DIAGNOSIS — I10 Essential (primary) hypertension: Secondary | ICD-10-CM

## 2017-06-11 DIAGNOSIS — Z79899 Other long term (current) drug therapy: Secondary | ICD-10-CM

## 2017-06-11 DIAGNOSIS — Z6837 Body mass index (BMI) 37.0-37.9, adult: Secondary | ICD-10-CM

## 2017-06-11 DIAGNOSIS — J45909 Unspecified asthma, uncomplicated: Secondary | ICD-10-CM

## 2017-06-11 DIAGNOSIS — D509 Iron deficiency anemia, unspecified: Secondary | ICD-10-CM

## 2017-06-11 DIAGNOSIS — E782 Mixed hyperlipidemia: Secondary | ICD-10-CM | POA: Diagnosis not present

## 2017-06-11 DIAGNOSIS — E559 Vitamin D deficiency, unspecified: Secondary | ICD-10-CM

## 2017-06-11 DIAGNOSIS — R0602 Shortness of breath: Secondary | ICD-10-CM

## 2017-06-11 DIAGNOSIS — Z1211 Encounter for screening for malignant neoplasm of colon: Secondary | ICD-10-CM

## 2017-06-11 DIAGNOSIS — Z0001 Encounter for general adult medical examination with abnormal findings: Secondary | ICD-10-CM | POA: Diagnosis not present

## 2017-06-11 MED ORDER — FLUTICASONE FUROATE-VILANTEROL 100-25 MCG/INH IN AEPB: 1.0000 | INHALATION_SPRAY | Freq: Every day | RESPIRATORY_TRACT | 0 refills | Status: DC

## 2017-06-11 MED ORDER — DEXLANSOPRAZOLE 60 MG PO CPDR: 60.0000 mg | DELAYED_RELEASE_CAPSULE | Freq: Every day | ORAL | 0 refills | Status: DC

## 2017-06-11 NOTE — Progress Notes (Signed)
Complete Physical  Assessment and Plan:  Encounter for general adult medical examination with abnormal findings 1 year 4 month follow up Get MGM, given number to call  BMI 37.0-37.9, adult - long discussion about weight loss, diet, and exercise -recommended diet heavy in fruits and veggies and low in animal meats, cheeses, and dairy products  -     TSH  Essential hypertension - continue medications, DASH diet, exercise and monitor at home. Call if greater than 130/80.  -     CBC with Differential/Platelet -     BASIC METABOLIC PANEL WITH GFR -     Hepatic function panel -     TSH -     Urinalysis, Routine w reflex microscopic -     Microalbumin / creatinine urine ratio -     EKG 12-Lead  Class 2 severe obesity due to excess calories with serious comorbidity and body mass index (BMI) of 37.0 to 37.9 in adult [facility]) - long discussion about weight loss, diet, and exercise -recommended diet heavy in fruits and veggies and low in animal meats, cheeses, and dairy products -     TSH  Mixed hyperlipidemia -continue medications, check lipids, decrease fatty foods, increase activity.  -     Lipid panel  Medication management -     Magnesium  Iron deficiency anemia, unspecified iron deficiency anemia type -     Iron,Total/Total Iron Binding Cap -     Vitamin B12  Vitamin D deficiency -     VITAMIN D 25 Hydroxy (Vit-D Deficiency, Fractures)  Uncomplicated asthma, unspecified asthma severity, unspecified whether persistent -     fluticasone furoate-vilanterol (BREO ELLIPTA) 100-25 MCG/INH AEPB; Inhale 1 puff into the lungs daily. Rinse mouth with water after each use  Morbid obesity (HCC) - long discussion about weight loss, diet, and exercise -recommended diet heavy in fruits and veggies and low in animal meats, cheeses, and dairy products  SOB (shortness of breath) -     fluticasone furoate-vilanterol (BREO ELLIPTA) 100-25 MCG/INH AEPB; Inhale 1 puff into the lungs daily.  Rinse mouth with water after each use -     dexlansoprazole (DEXILANT) 60 MG capsule; Take 1 capsule (60 mg total) by mouth daily. -     EKG 12-Lead - Normal EKG- unchanged, no ST changes, sinus brady, T wave flattening non specific - SOB no CP or other accompaniments, ?asthma versus GERD- treat both  - if not better will refer to cardio - The patient was advised to call immediately if she has any concerning symptoms in the interval. The patient voices understanding of current treatment options and is in agreement with the current care plan.The patient knows to call the clinic with any problems, questions or concerns or go to the ER if any further progression of symptoms.   Encounter for screening colonoscopy -     Ambulatory referral to Gastroenterology   Discussed med's effects and SE's. Screening labs and tests as requested with regular follow-up as recommended. Future Appointments Date Time Provider Department Center  06/22/2018 10:00 AM Quentin Mulling, PA-C [facility] None    HPI  46 y.o. AA female  presents for a complete physical.  Her blood pressure has not been controlled at home, today their BP is BP: 126/80.  She does not workout. She denies chest pain, shortness of breath, dizziness.  She has a history of asthma, she has been having SOB x 1 month, states feels like she "stops breathing occ" and with exertion has  been more SOB. She has had some GERD.  No CP. No wheezing, cough, no fever, chills. Has been having occ night sweats, just around her head/neck, not every night, no soaking shirts. Normal Echo 2014, mild LVH, normal EF 55-60%. She is on cholesterol medication and denies myalgias. Her cholesterol is at goal. The cholesterol last visit was:  Lab Results  Component Value Date   CHOL 163 06/10/2016   HDL 50 06/10/2016   LDLCALC 98 06/10/2016   TRIG 76 06/10/2016   CHOLHDL 3.3 06/10/2016  . She has been working on diet and exercise for diabetes which has been  controlled with oral metformin, she is not on bASA, she is on ACE/ARB and denies foot ulcerations, hyperglycemia, hypoglycemia , increased appetite, nausea, paresthesia of the feet, polydipsia, polyuria, visual disturbances, vomiting and weight loss. Last A1C in the office was:  Lab Results  Component Value Date   HGBA1C 5.3 06/10/2016   Patient is on Vitamin D supplement, has not been taking it   Lab Results  Component Value Date   VD25OH 21 (L) 06/10/2016     BMI is Body mass index is 37.81 kg/m., she is working on diet and exercise. Wt Readings from Last 3 Encounters:  06/11/17 193 lb 9.6 oz (87.8 kg)  12/13/16 197 lb (89.4 kg)  09/03/16 195 lb (88.5 kg)     Current Medications:  Current Outpatient Prescriptions on File Prior to Visit  Medication Sig Dispense Refill  . amLODipine (NORVASC) 10 MG tablet TAKE 1 TABLET(10 MG) BY MOUTH DAILY 90 tablet 0  . benazepril (LOTENSIN) 40 MG tablet Take 1 tablet (40 mg total) by mouth daily. 90 tablet 1  . bisoprolol-hydrochlorothiazide (ZIAC) 10-6.25 MG tablet TAKE 1 TABLET BY MOUTH DAILY 90 tablet 1  . hydrochlorothiazide (HYDRODIURIL) 25 MG tablet TAKE 1 TABLET(25 MG) BY MOUTH DAILY 90 tablet 0  . Iron-FA-B Cmp-C-Biot-Probiotic (FUSION PLUS) CAPS TAKE 1 CAPSULE BY MOUTH FOR IRON DEF ANEMIA 30 capsule 0  . norgestimate-ethinyl estradiol (ORTHO-CYCLEN,SPRINTEC,PREVIFEM) 0.25-35 MG-MCG tablet Take 1 tablet by mouth daily. 1 Package 11  . pravastatin (PRAVACHOL) 40 MG tablet TAKE 1 TABLET BY MOUTH EVERY DAY AT BEDTIME FOR CHOLESTEROL 90 tablet 0  . metFORMIN (GLUCOPHAGE-XR) 500 MG 24 hr tablet Take 1 to 2 tablets 2 x / day with a meal for Diabetes 360 tablet 1   No current facility-administered medications on file prior to visit.     Health Maintenance:   Immunization History  Administered Date(s) Administered  . Influenza Split 06/09/2015  . Influenza, Seasonal, Injecte, Preservative Fre 06/10/2016  . Pneumococcal-Unspecified 08/13/2007   . Td 11/10/2005, 06/10/2016   Tetanus: 2017 Pneumovax: 2009 Flu vaccine: got at work  Patient's last menstrual period was 05/28/2017. Pap 2016 HPV negative due 2021 has follow up with [facility] 20th MGM: 06/2014 Colonoscopy: DUE Last Eye Exam: Due next month  Patient Care Team: Lucky Cowboy, MD as PCP - General (Internal Medicine) Shea Evans, MD as Consulting Physician (Obstetrics and Gynecology) Louis Meckel, MD as Consulting Physician (Gastroenterology) Reece Packer, MD as Consulting Physician (Oncology) Kathleene Hazel, MD as Consulting Physician (Cardiology)  Medical History:  Past Medical History:  Diagnosis Date  . Allergic rhinitis   . Asthma   . DM (diabetes mellitus) (HCC)    borderline  . High cholesterol   . Hypertension   . Iron deficiency anemia   . Low blood potassium    Allergies Allergies  Allergen Reactions  . Tussionex Pennkinetic  Er [Hydrocod Polst-Cpm Polst Er]     causes headache    SURGICAL HISTORY She  has a past surgical history that includes Cesarean section; Umbilical hernia repair (1977); and Tubal ligation (1996). FAMILY HISTORY Her family history includes Breast cancer in her maternal aunt; Diabetes in her father, maternal aunt, and mother; Heart disease in her maternal grandmother; Mitral valve prolapse in her mother; Stroke in her mother. SOCIAL HISTORY She  reports that she has never smoked. She has never used smokeless tobacco. She reports that she does not drink alcohol or use drugs.  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  HENT: Negative for congestion, ear pain and sore throat.   Eyes: Negative.   Respiratory: Negative for cough, shortness of breath and wheezing.   Cardiovascular: Negative for chest pain, palpitations and leg swelling.  Gastrointestinal:  Negative for abdominal pain, blood in stool, constipation, diarrhea, heartburn and melena.  Genitourinary: Negative.   Skin: Negative.   Neurological: Negative for dizziness, sensory change, loss of consciousness and headaches.  Psychiatric/Behavioral: Negative for depression. The patient is not nervous/anxious and does not have insomnia.     Physical Exam: Estimated body mass index is 37.81 kg/m as calculated from the following:   Height as of this encounter: 5' (1.524 m).   Weight as of this encounter: 193 lb 9.6 oz (87.8 kg). BP 126/80   Pulse 85   Temp 97.7 F (36.5 C)   Resp 14   Ht 5' (1.524 m)   Wt 193 lb 9.6 oz (87.8 kg)   LMP 05/28/2017   SpO2 99%   BMI 37.81 kg/m   General Appearance: Well nourished well developed, in no apparent distress.  Eyes: PERRLA, EOMs, conjunctiva no swelling or erythema ENT/Mouth: Ear canals normal without obstruction, swelling, erythema, or discharge.  TMs normal bilaterally with no erythema, bulging, retraction, or loss of landmark.  Oropharynx moist and clear with no exudate, erythema, or swelling.   Neck: Supple, thyroid normal. No bruits.  No cervical adenopathy Respiratory: Respiratory effort normal, Breath sounds clear A&P without wheeze, rhonchi, rales.   Cardio: RRR without murmurs, rubs or gallops. Brisk peripheral pulses without edema.  Chest: symmetric, with normal excursions Breasts: defer Abdomen: Soft, nontender, no guarding, rebound, hernias, masses, or organomegaly.  Lymphatics: Non tender without lymphadenopathy.  Musculoskeletal: Full ROM all peripheral extremities,5/5 strength, and normal gait.  Skin: Warm, dry without rashes, lesions, ecchymosis. Neuro: Awake and oriented X 3, Cranial nerves intact, reflexes equal bilaterally. Normal muscle tone, no cerebellar symptoms. Sensation intact.  Psych:  normal affect, Insight and Judgment appropriate.   EKG: WNL no changes.  Over 40 minutes of exam, counseling, chart review  and critical decision making was performed  Quentin MullingAmanda Amire Gossen 10:05 AM [facility]

## 2017-06-11 NOTE — Patient Instructions (Addendum)
[facility]  7 a.m.-6:30 p.m., Monday 7 a.m.-5 p.m., Tuesday-Friday Schedule an appointment by calling [phone].  Will give you breo samples x 1 month Wash mouth afterwards  Can look up LPR or silent reflux Take PPI x 2 weeks then go to H2 see below  Take omeprazole over the counter for 2 weeks, then go to zantac 150-300 mg at night for 2 weeks, then you can stop.  Avoid alcohol, spicy foods, NSAIDS (aleve, ibuprofen) at this time. See foods below.   Food Choices for Gastroesophageal Reflux Disease When you have gastroesophageal reflux disease (GERD), the foods you eat and your eating habits are very important. Choosing the right foods can help ease the discomfort of GERD. WHAT GENERAL GUIDELINES DO I NEED TO FOLLOW?  Choose fruits, vegetables, whole grains, low-fat dairy products, and low-fat meat, fish, and poultry.  Limit fats such as oils, salad dressings, butter, nuts, and avocado.  Keep a food diary to identify foods that cause symptoms.  Avoid foods that cause reflux. These may be different for different people.  Eat frequent small meals instead of three large meals each day.  Eat your meals slowly, in a relaxed setting.  Limit fried foods.  Cook foods using methods other than frying.  Avoid drinking alcohol.  Avoid drinking large amounts of liquids with your meals.  Avoid bending over or lying down until 2-3 hours after eating. WHAT FOODS ARE NOT RECOMMENDED? The following are some foods and drinks that may worsen your symptoms: Vegetables Tomatoes. Tomato juice. Tomato and spaghetti sauce. Chili peppers. Onion and garlic. Horseradish. Fruits Oranges, grapefruit, and lemon (fruit and juice). Meats High-fat meats, fish, and poultry. This includes hot dogs, ribs, ham, sausage, salami, and bacon. Dairy Whole milk and chocolate milk. Sour cream. Cream. Butter. Ice cream. Cream cheese.  Beverages Coffee and tea, with or without  caffeine. Carbonated beverages or energy drinks. Condiments Hot sauce. Barbecue sauce.  Sweets/Desserts Chocolate and cocoa. Donuts. Peppermint and spearmint. Fats and Oils High-fat foods, including [place] fries and potato chips. Other Vinegar. Strong spices, such as black pepper, white pepper, red pepper, cayenne, curry powder, cloves, ginger, and chili powder.   Vitamin D goal is between 60-80  Please make sure that you are taking your Vitamin D as directed.   It is very important as a natural anti-inflammatory   helping hair, skin, and nails, as well as reducing stroke and heart attack risk.   It helps your bones and helps with mood.  We want you on at least 5000 IU daily  It also decreases numerous cancer risks so please take it as directed.   Low Vit D is associated with a 200-300% higher risk for CANCER   and 200-300% higher risk for HEART   ATTACK  &  STROKE.    .....................................Marland Kitchen.  It is also associated with higher death rate at younger ages,   autoimmune diseases like Rheumatoid arthritis, Lupus, Multiple Sclerosis.     Also many other serious conditions, like depression, Alzheimer's  Dementia, infertility, muscle aches, fatigue, fibromyalgia - just to name a few.  +++++++++++++++++++  Can get liquid vitamin D from [employer]  OR [place] at  [facility] [address], [place], [place] [address] Or you can try earth fare

## 2017-06-12 LAB — CBC WITH DIFFERENTIAL/PLATELET
BASOS ABS: 29 {cells}/uL (ref 0–200)
Basophils Relative: 0.6 %
EOS PCT: 3.3 %
Eosinophils Absolute: 162 cells/uL (ref 15–500)
HEMATOCRIT: 33.7 % — AB (ref 35.0–45.0)
HEMOGLOBIN: 11.1 g/dL — AB (ref 11.7–15.5)
LYMPHS ABS: 2132 {cells}/uL (ref 850–3900)
MCH: 26.6 pg — ABNORMAL LOW (ref 27.0–33.0)
MCHC: 32.9 g/dL (ref 32.0–36.0)
MCV: 80.6 fL (ref 80.0–100.0)
MPV: 10.2 fL (ref 7.5–12.5)
Monocytes Relative: 7.1 %
NEUTROS ABS: 2230 {cells}/uL (ref 1500–7800)
Neutrophils Relative %: 45.5 %
Platelets: 331 10*3/uL (ref 140–400)
RBC: 4.18 10*6/uL (ref 3.80–5.10)
RDW: 12.1 % (ref 11.0–15.0)
Total Lymphocyte: 43.5 %
WBC mixed population: 348 cells/uL (ref 200–950)
WBC: 4.9 10*3/uL (ref 3.8–10.8)

## 2017-06-12 LAB — IRON, TOTAL/TOTAL IRON BINDING CAP
%SAT: 15 % (calc) (ref 11–50)
IRON: 64 ug/dL (ref 40–190)
TIBC: 432 ug/dL (ref 250–450)

## 2017-06-12 LAB — BASIC METABOLIC PANEL WITH GFR
BUN/Creatinine Ratio: 10 (calc) (ref 6–22)
BUN: 6 mg/dL — ABNORMAL LOW (ref 7–25)
CO2: 31 mmol/L (ref 20–32)
CREATININE: 0.6 mg/dL (ref 0.50–1.10)
Calcium: 9.3 mg/dL (ref 8.6–10.2)
Chloride: 100 mmol/L (ref 98–110)
GFR, EST AFRICAN AMERICAN: 128 mL/min/{1.73_m2} (ref >=60)
GFR, EST NON AFRICAN AMERICAN: 110 mL/min/{1.73_m2} (ref >=60)
Glucose, Bld: 84 mg/dL (ref 65–99)
Potassium: 3.5 mmol/L (ref 3.5–5.3)
Sodium: 138 mmol/L (ref 135–146)

## 2017-06-12 LAB — LIPID PANEL
Cholesterol: 152 mg/dL (ref <200)
HDL: 48 mg/dL — AB (ref >50)
LDL CHOLESTEROL (CALC): 78 mg/dL
NON-HDL CHOLESTEROL (CALC): 104 mg/dL (ref <130)
TRIGLYCERIDES: 165 mg/dL — AB (ref <150)
Total CHOL/HDL Ratio: 3.2 (calc) (ref <5.0)

## 2017-06-12 LAB — URINALYSIS, ROUTINE W REFLEX MICROSCOPIC
BACTERIA UA: NONE SEEN /HPF
Bilirubin Urine: NEGATIVE
Glucose, UA: NEGATIVE
HYALINE CAST: NONE SEEN /LPF
KETONES UR: NEGATIVE
LEUKOCYTES UA: NEGATIVE
Nitrite: NEGATIVE
Protein, ur: NEGATIVE
SPECIFIC GRAVITY, URINE: 1.014 (ref 1.001–1.03)
pH: 7 (ref 5.0–8.0)

## 2017-06-12 LAB — HEPATIC FUNCTION PANEL
AG RATIO: 1.4 (calc) (ref 1.0–2.5)
ALT: 22 U/L (ref 6–29)
AST: 19 U/L (ref 10–35)
Albumin: 4.2 g/dL (ref 3.6–5.1)
Alkaline phosphatase (APISO): 41 U/L (ref 33–115)
BILIRUBIN TOTAL: 0.4 mg/dL (ref 0.2–1.2)
Bilirubin, Direct: 0.1 mg/dL (ref 0.0–0.2)
GLOBULIN: 3.1 g/dL (ref 1.9–3.7)
Indirect Bilirubin: 0.3 mg/dL (calc) (ref 0.2–1.2)
Total Protein: 7.3 g/dL (ref 6.1–8.1)

## 2017-06-12 LAB — MICROALBUMIN / CREATININE URINE RATIO
CREATININE, URINE: 64 mg/dL (ref 20–275)
Microalb Creat Ratio: 14 mcg/mg creat (ref <30)
Microalb, Ur: 0.9 mg/dL

## 2017-06-12 LAB — MAGNESIUM: Magnesium: 1.8 mg/dL (ref 1.5–2.5)

## 2017-06-12 LAB — VITAMIN D 25 HYDROXY (VIT D DEFICIENCY, FRACTURES): Vit D, 25-Hydroxy: 25 ng/mL — ABNORMAL LOW (ref 30–100)

## 2017-06-12 LAB — VITAMIN B12: Vitamin B-12: 630 pg/mL (ref 200–1100)

## 2017-06-12 LAB — TSH: TSH: 1.07 mIU/L

## 2017-07-01 DIAGNOSIS — Z6838 Body mass index (BMI) 38.0-38.9, adult: Secondary | ICD-10-CM | POA: Diagnosis not present

## 2017-07-01 DIAGNOSIS — Z01419 Encounter for gynecological examination (general) (routine) without abnormal findings: Secondary | ICD-10-CM | POA: Diagnosis not present

## 2017-07-01 DIAGNOSIS — Z124 Encounter for screening for malignant neoplasm of cervix: Secondary | ICD-10-CM | POA: Diagnosis not present

## 2017-07-01 DIAGNOSIS — N920 Excessive and frequent menstruation with regular cycle: Secondary | ICD-10-CM | POA: Diagnosis not present

## 2017-07-01 DIAGNOSIS — Z1151 Encounter for screening for human papillomavirus (HPV): Secondary | ICD-10-CM | POA: Diagnosis not present

## 2017-07-05 ENCOUNTER — Other Ambulatory Visit: Payer: Self-pay | Admitting: Internal Medicine

## 2017-07-11 ENCOUNTER — Encounter: Payer: Self-pay | Admitting: Physician Assistant

## 2017-07-29 ENCOUNTER — Other Ambulatory Visit: Payer: Self-pay | Admitting: Internal Medicine

## 2017-07-29 DIAGNOSIS — Z139 Encounter for screening, unspecified: Secondary | ICD-10-CM

## 2017-08-01 ENCOUNTER — Other Ambulatory Visit: Payer: Self-pay | Admitting: Internal Medicine

## 2017-08-01 DIAGNOSIS — I10 Essential (primary) hypertension: Secondary | ICD-10-CM

## 2017-08-02 ENCOUNTER — Other Ambulatory Visit: Payer: Self-pay | Admitting: Internal Medicine

## 2017-08-02 DIAGNOSIS — E785 Hyperlipidemia, unspecified: Secondary | ICD-10-CM

## 2017-08-27 ENCOUNTER — Ambulatory Visit: Payer: BLUE CROSS/BLUE SHIELD

## 2017-08-27 ENCOUNTER — Ambulatory Visit: Payer: BLUE CROSS/BLUE SHIELD | Admitting: Internal Medicine

## 2017-08-27 ENCOUNTER — Encounter: Payer: Self-pay | Admitting: Internal Medicine

## 2017-08-27 VITALS — BP 122/74 | HR 76 | Temp 97.3°F | Resp 16 | Ht 60.0 in | Wt 197.0 lb

## 2017-08-27 DIAGNOSIS — J0100 Acute maxillary sinusitis, unspecified: Secondary | ICD-10-CM | POA: Diagnosis not present

## 2017-08-27 DIAGNOSIS — J042 Acute laryngotracheitis: Secondary | ICD-10-CM | POA: Diagnosis not present

## 2017-08-27 MED ORDER — PROMETHAZINE-DM 6.25-15 MG/5ML PO SYRP: ORAL_SOLUTION | ORAL | 1 refills | Status: DC

## 2017-08-27 MED ORDER — AZITHROMYCIN 250 MG PO TABS: ORAL_TABLET | ORAL | 1 refills | Status: DC

## 2017-08-27 MED ORDER — PREDNISONE 10 MG PO TABS: ORAL_TABLET | ORAL | 0 refills | Status: DC

## 2017-08-27 NOTE — Patient Instructions (Signed)

## 2017-08-27 NOTE — Progress Notes (Signed)
  Subjective:    Patient ID: Alexis Marquez, female    DOB: 02/22/1971, 47 y.o.   MRN: [id number]  HPI    This nice 47 yo single BF with Labile HTN, HLD, T2_DM (08/2013) presents with 3-4 day prodrome of worsening respiratory sx's with dry cough, scratchy S/T and mucopurulent sinus drainage. No fever, chills, sweats, dyspnea  or rash.   Medication Sig  . amLODipine (NORVASC) 10 MG tablet TAKE 1 TABLET(10 MG) BY MOUTH DAILY  . benazepril (LOTENSIN) 40 MG tablet Take 1 tablet (40 mg total) by mouth daily.  . bisoprolol-hydrochlorothiazide (ZIAC) 10-6.25 MG tablet TAKE 1 TABLET BY MOUTH DAILY  . hydrochlorothiazide (HYDRODIURIL) 25 MG tablet TAKE 1 TABLET(25 MG) BY MOUTH DAILY  . metFORMIN (GLUCOPHAGE-XR) 500 MG 24 hr tablet TAKE 1 TO 2 TABLETS BY MOUTH TWICE DAILY WITH A MEAL FOR DIABETES  . pravastatin (PRAVACHOL) 40 MG tablet TAKE 1 TABLET BY MOUTH EVERY DAY AT BEDTIME FOR CHOLESTEROL  . dexlansoprazole (DEXILANT) 60 MG capsule Take 1 capsule (60 mg total) by mouth daily.  . fluticasone furoate-vilanterol (BREO ELLIPTA) 100-25 MCG/INH AEPB Inhale 1 puff into the lungs daily. Rinse mouth with water after each use  . Iron-FA-B Cmp-C-Biot-Probiotic (FUSION PLUS) CAPS TAKE 1 CAPSULE BY MOUTH FOR IRON DEF ANEMIA  . norgestimate-ethinyl estradiol (ORTHO-CYCLEN,SPRINTEC,PREVIFEM) 0.25-35 MG-MCG tablet Take 1 tablet by mouth daily.   Past Medical History:  Diagnosis Date  . Allergic rhinitis   . Asthma   . DM (diabetes mellitus) (HCC)    borderline  . High cholesterol   . Hypertension   . Iron deficiency anemia   . Low blood potassium    Review of Systems  10 point systems review negative except as above.    Objective:   Physical Exam  BP 122/74   Pulse 76   Temp (!) 97.3 F (36.3 C)   Resp 16   Ht 5' (1.524 m)   Wt 197 lb (89.4 kg)   BMI 38.47 kg/m   In No Distress. Hoarse. Dry cough. No Stridor.   HEENT - Eac's patent. TM's Nl. Sl tender Max sinus areas. NasoOroPharynx 1-2+  injected w/o exudate. Neck - supple. Nl Thyroid. Carotids 2+ & No bruits, nodes, JVD Chest - Bilat scattered dry rales & rhonchi, but no wheezes. Cor - Nl HS. RRR w/o sig MGR. PP 1(+).  Neuro - No obvious Cr N abnormalities.  Nl w/o focal abnormalities. Skin - exposed clear w/o rash cyanosis or icterus    Assessment & Plan:   1. Laryngotracheitis   2. Acute non-recurrent maxillary sinusitis  - predniSONE (DELTASONE) 10 MG tablet; 1 tab 3 x day for 2 days, then 1 tab 2 x day for 2 days, then 1 tab 1 x day for 3 days  Dispense: 13 tablet; Refill: 0  - azithromycin (ZITHROMAX) 250 MG tablet; Take 2 tablets (500 mg) on  Day 1,  followed by 1 tablet (250 mg) once daily on Days 2 through 5.  Dispense: 6 each; Refill: 1  - promethazine-dextromethorphan (PROMETHAZINE-DM) 6.25-15 MG/5ML syrup; Take 1 to 2 tsp enery 4 hours if needed for cough  Dispense: 360 mL; Refill: 1  - Advised hyperglycemic precautions on Prednisone and cautioned to monitor CBG's closely.

## 2017-09-02 ENCOUNTER — Other Ambulatory Visit: Payer: Self-pay | Admitting: Physician Assistant

## 2017-09-02 DIAGNOSIS — I10 Essential (primary) hypertension: Secondary | ICD-10-CM

## 2017-09-09 ENCOUNTER — Encounter: Payer: Self-pay | Admitting: Physician Assistant

## 2017-09-09 DIAGNOSIS — E785 Hyperlipidemia, unspecified: Secondary | ICD-10-CM

## 2017-09-09 DIAGNOSIS — I10 Essential (primary) hypertension: Secondary | ICD-10-CM

## 2017-09-09 MED ORDER — PRAVASTATIN SODIUM 40 MG PO TABS: ORAL_TABLET | ORAL | 1 refills | Status: DC

## 2017-09-09 MED ORDER — AMLODIPINE BESYLATE 10 MG PO TABS: ORAL_TABLET | ORAL | 0 refills | Status: DC

## 2017-09-09 MED ORDER — BENAZEPRIL HCL 40 MG PO TABS: ORAL_TABLET | ORAL | 0 refills | Status: DC

## 2017-09-09 MED ORDER — BISOPROLOL-HYDROCHLOROTHIAZIDE 10-6.25 MG PO TABS: 1.0000 | ORAL_TABLET | Freq: Every day | ORAL | 1 refills | Status: DC

## 2017-09-09 MED ORDER — HYDROCHLOROTHIAZIDE 25 MG PO TABS: ORAL_TABLET | ORAL | 1 refills | Status: DC

## 2017-09-12 ENCOUNTER — Ambulatory Visit
Admission: RE | Admit: 2017-09-12 | Discharge: 2017-09-12 | Disposition: A | Discharge status: Home / Self Care | Payer: BLUE CROSS/BLUE SHIELD | Source: Ambulatory Visit | Attending: Internal Medicine | Admitting: Internal Medicine

## 2017-09-12 DIAGNOSIS — Z139 Encounter for screening, unspecified: Secondary | ICD-10-CM

## 2017-09-12 DIAGNOSIS — Z1231 Encounter for screening mammogram for malignant neoplasm of breast: Secondary | ICD-10-CM | POA: Diagnosis not present

## 2017-09-15 ENCOUNTER — Encounter: Payer: Self-pay | Admitting: Physician Assistant

## 2017-09-15 DIAGNOSIS — I10 Essential (primary) hypertension: Secondary | ICD-10-CM

## 2017-09-15 DIAGNOSIS — E785 Hyperlipidemia, unspecified: Secondary | ICD-10-CM

## 2017-09-15 MED ORDER — PRAVASTATIN SODIUM 40 MG PO TABS: ORAL_TABLET | ORAL | 1 refills | Status: DC

## 2017-09-15 MED ORDER — BENAZEPRIL HCL 40 MG PO TABS
ORAL_TABLET | ORAL | 0 refills | Status: DC
Start: 2017-09-15 — End: 2017-12-15

## 2017-09-16 ENCOUNTER — Other Ambulatory Visit: Payer: Self-pay | Admitting: Internal Medicine

## 2017-09-16 DIAGNOSIS — R928 Other abnormal and inconclusive findings on diagnostic imaging of breast: Secondary | ICD-10-CM

## 2017-09-23 ENCOUNTER — Ambulatory Visit
Admission: RE | Admit: 2017-09-23 | Discharge: 2017-09-23 | Disposition: A | Discharge status: Home / Self Care | Payer: BLUE CROSS/BLUE SHIELD | Source: Ambulatory Visit | Attending: Internal Medicine | Admitting: Internal Medicine

## 2017-09-23 ENCOUNTER — Other Ambulatory Visit: Payer: Self-pay | Admitting: Internal Medicine

## 2017-09-23 DIAGNOSIS — R928 Other abnormal and inconclusive findings on diagnostic imaging of breast: Secondary | ICD-10-CM

## 2017-09-23 DIAGNOSIS — R921 Mammographic calcification found on diagnostic imaging of breast: Secondary | ICD-10-CM | POA: Diagnosis not present

## 2017-09-23 DIAGNOSIS — N6312 Unspecified lump in the right breast, upper inner quadrant: Secondary | ICD-10-CM | POA: Diagnosis not present

## 2017-09-23 DIAGNOSIS — N6310 Unspecified lump in the right breast, unspecified quadrant: Secondary | ICD-10-CM

## 2017-09-29 ENCOUNTER — Ambulatory Visit
Admission: RE | Admit: 2017-09-29 | Discharge: 2017-09-29 | Disposition: A | Discharge status: Home / Self Care | Payer: BLUE CROSS/BLUE SHIELD | Source: Ambulatory Visit | Attending: Internal Medicine | Admitting: Internal Medicine

## 2017-09-29 DIAGNOSIS — N6310 Unspecified lump in the right breast, unspecified quadrant: Secondary | ICD-10-CM

## 2017-09-29 DIAGNOSIS — D241 Benign neoplasm of right breast: Secondary | ICD-10-CM | POA: Diagnosis not present

## 2017-09-29 DIAGNOSIS — N6341 Unspecified lump in right breast, subareolar: Secondary | ICD-10-CM | POA: Diagnosis not present

## 2017-10-10 ENCOUNTER — Ambulatory Visit: Payer: Self-pay | Admitting: Physician Assistant

## 2017-10-17 NOTE — Progress Notes (Deleted)
Assessment and Plan:   Hypertension -Continue medication, monitor blood pressure at home. Continue DASH diet.  Reminder to go to the ER if any CP, SOB, nausea, dizziness, severe HA, changes vision/speech, left arm numbness and tingling and jaw pain. She is on amlodipine, benazepril and ziac, add on low dose HCTZ Will monitor potassium/kidney follow up 1 month  Obesity Obesity with co morbidities- long discussion about weight loss, diet, and exercise   Iron deficiency anemia, unspecified Continue iron/vit C, check labs Willing to start on low dose BCP with iron Add low dose asa with estrogen Follow up GYN   Continue diet and meds as discussed. Further disposition pending results of labs. Over 30 minutes of exam, counseling, chart review, and critical decision making was performed  HPI 47 y.o. female  presents for 3 month follow up on hypertension, cholesterol, prediabetes, and vitamin D deficiency.   Her blood pressure has been controlled at home, could not tolerate the clonidine patch, has been off the patch, today their BP is    Had recent negative right breast biopsy.  She also had low iron/CBC, will recheck that today, has been on one at night fusion. Denies black stools, blood in stool.  Has very heavy menses, has not been to GYN.  Lab Results  Component Value Date   IRON 64 06/11/2017   TIBC 432 06/11/2017   FERRITIN 12 09/12/2015   Lab Results  Component Value Date   WBC 4.9 06/11/2017   HGB 11.1 (L) 06/11/2017   HCT 33.7 (L) 06/11/2017   MCV 80.6 06/11/2017   PLT 331 06/11/2017    She does not workout. She denies chest pain, dizziness. She has asthma, she is just on xopenix, no maintenence inhaler, has been worse x 2 weeks.  She is on cholesterol medication and denies myalgias. Her cholesterol is at goal. The cholesterol last visit was:   Lab Results  Component Value Date   CHOL 152 06/11/2017   HDL 48 (L) 06/11/2017   LDLCALC 98 06/10/2016   TRIG 165 (H)  06/11/2017   CHOLHDL 3.2 06/11/2017    She has been working on diet and exercise for prediabetes, she is on metformin for insulin resistance and denies paresthesia of the feet, polydipsia, polyuria and visual disturbances. Last A1C in the office was:  Lab Results  Component Value Date   HGBA1C 5.3 06/10/2016   Patient is on Vitamin D supplement.   Lab Results  Component Value Date   VD25OH 25 (L) 06/11/2017     BMI is There is no height or weight on file to calculate BMI., she is working on diet and exercise. Wt Readings from Last 3 Encounters:  08/27/17 197 lb (89.4 kg)  06/11/17 193 lb 9.6 oz (87.8 kg)  12/13/16 197 lb (89.4 kg)     Current Medications:  Current Outpatient Medications on File Prior to Visit  Medication Sig Dispense Refill  . amLODipine (NORVASC) 10 MG tablet TAKE 1 TABLET(10 MG) BY MOUTH DAILY 90 tablet 0  . benazepril (LOTENSIN) 40 MG tablet TAKE 1 TABLET(40 MG) BY MOUTH DAILY 90 tablet 0  . bisoprolol-hydrochlorothiazide (ZIAC) 10-6.25 MG tablet Take 1 tablet by mouth daily. 90 tablet 1  . hydrochlorothiazide (HYDRODIURIL) 25 MG tablet TAKE 1 TABLET(25 MG) BY MOUTH DAILY 90 tablet 1  . metFORMIN (GLUCOPHAGE-XR) 500 MG 24 hr tablet TAKE 1 TO 2 TABLETS BY MOUTH TWICE DAILY WITH A MEAL FOR DIABETES 120 tablet 0  . pravastatin (PRAVACHOL) 40 MG tablet  TAKE 1 TABLET BY MOUTH EVERY DAY AT BEDTIME FOR CHOLESTEROL 90 tablet 1   No current facility-administered medications on file prior to visit.    Medical History:  Past Medical History:  Diagnosis Date  . Allergic rhinitis   . Asthma   . DM (diabetes mellitus) (HCC)    borderline  . High cholesterol   . Hypertension   . Iron deficiency anemia   . Low blood potassium    Allergies:  Allergies  Allergen Reactions  . Tussionex Pennkinetic Er [Hydrocod Polst-Cpm Polst Er]     causes headache     Review of Systems:  Review of Systems  Constitutional: Negative for chills, diaphoresis, fever,  malaise/fatigue and weight loss.  HENT: Negative for congestion, ear discharge, ear pain, hearing loss, nosebleeds, sore throat and tinnitus.   Eyes: Negative.   Respiratory: Negative for cough, hemoptysis, sputum production, shortness of breath, wheezing and stridor.   Cardiovascular: Negative.  Negative for chest pain.  Gastrointestinal: Negative.   Genitourinary: Negative.   Musculoskeletal: Negative.   Skin: Negative.   Neurological: Negative.  Negative for weakness and headaches.  Psychiatric/Behavioral: Negative.     Family history- Review and unchanged Social history- Review and unchanged Physical Exam: There were no vitals taken for this visit. Wt Readings from Last 3 Encounters:  08/27/17 197 lb (89.4 kg)  06/11/17 193 lb 9.6 oz (87.8 kg)  12/13/16 197 lb (89.4 kg)   General Appearance: Well nourished, in no apparent distress. Eyes: PERRLA, EOMs, conjunctiva no swelling or erythema Sinuses: No Frontal/maxillary tenderness ENT/Mouth: Ext aud canals clear, TMs without erythema, bulging. No erythema, swelling, or exudate on post pharynx.  Tonsils not swollen or erythematous. Hearing normal.  Neck: Supple, thyroid normal.  Respiratory: Respiratory effort normal, decreased breath sounds bilaterally with mild expiratory wheeze without rales, rhonchi, or stridor.  Cardio: RRR with no MRGs. Brisk peripheral pulses without edema.  Abdomen: Soft, + BS,  Non tender, no guarding, rebound, hernias, masses. Lymphatics: Non tender without lymphadenopathy.  Musculoskeletal: Full ROM, 5/5 strength, Normal gait Skin: Warm, dry without rashes, lesions, ecchymosis.  Neuro: Cranial nerves intact. Normal muscle tone, no cerebellar symptoms. Psych: Awake and oriented X 3, normal affect, Insight and Judgment appropriate.    Quentin Mulling, PA-C 7:37 AM [facility]

## 2017-10-20 ENCOUNTER — Ambulatory Visit: Payer: Self-pay | Admitting: Physician Assistant

## 2017-12-08 ENCOUNTER — Other Ambulatory Visit: Payer: Self-pay | Admitting: Physician Assistant

## 2017-12-15 ENCOUNTER — Other Ambulatory Visit: Payer: Self-pay | Admitting: Physician Assistant

## 2017-12-15 DIAGNOSIS — I10 Essential (primary) hypertension: Secondary | ICD-10-CM

## 2018-01-03 DIAGNOSIS — L237 Allergic contact dermatitis due to plants, except food: Secondary | ICD-10-CM | POA: Diagnosis not present

## 2018-01-03 DIAGNOSIS — I10 Essential (primary) hypertension: Secondary | ICD-10-CM | POA: Diagnosis not present

## 2018-01-11 ENCOUNTER — Encounter: Payer: Self-pay | Admitting: Physician Assistant

## 2018-01-13 ENCOUNTER — Other Ambulatory Visit: Payer: Self-pay | Admitting: Internal Medicine

## 2018-01-13 DIAGNOSIS — I10 Essential (primary) hypertension: Secondary | ICD-10-CM

## 2018-01-15 ENCOUNTER — Encounter (INDEPENDENT_AMBULATORY_CARE_PROVIDER_SITE_OTHER): Payer: Self-pay

## 2018-01-30 ENCOUNTER — Other Ambulatory Visit: Payer: Self-pay | Admitting: Internal Medicine

## 2018-01-30 DIAGNOSIS — I10 Essential (primary) hypertension: Secondary | ICD-10-CM

## 2018-02-09 NOTE — Progress Notes (Signed)
FOLLOW UP  Assessment and Plan:   Hypertension Continue meds; will work on weight loss Monitor blood pressure at home; patient to call if consistently greater than 130/80 Continue DASH diet.   Reminder to go to the ER if any CP, SOB, nausea, dizziness, severe HA, changes vision/speech, left arm numbness and tingling and jaw pain.  Cholesterol Currently at goal; continue statin; diet for triglycerides discussed Continue low cholesterol diet and exercise.  Check lipid panel.   Other abnormal glucose Continue medication: currently on metformin 500 mg BID, discussed tapering down in light of improved A1C Continue diet and exercise.  Perform daily foot/skin check, notify office of any concerning changes.  Check A1C  Obesity with co morbidities Long discussion about weight loss, diet, and exercise Recommended diet heavy in fruits and veggies and low in animal meats, cheeses, and dairy products, appropriate calorie intake Discussed ideal weight for height and initial weight goal (198lb) Will follow up in 3 months  Vitamin D Def Below goal at last check Continue to recommend supplementation to maintain goal of 70-100 Check Vit D level   Continue diet and meds as discussed. Further disposition pending results of labs. Discussed med's effects and SE's.   Over 30 minutes of exam, counseling, chart review, and critical decision making was performed.   Future Appointments  Date Time Provider Department Center  06/22/2018 10:00 AM Quentin Mulling, PA-C [facility] None    ----------------------------------------------------------------------------------------------------------------------  HPI 47 y.o. female  presents for 3 month follow up on hypertension, cholesterol, diabetes, weight and vitamin D deficiency.   BMI is Body mass index is 40.39 kg/m., she has not been working on diet and exercise.  Wt Readings from Last 3 Encounters:  02/10/18 206 lb 12.8 oz (93.8 kg)  08/27/17  197 lb (89.4 kg)  06/11/17 193 lb 9.6 oz (87.8 kg)   Her blood pressure has been controlled at home (once a week, running 136/74), today their BP is BP: 136/90  She does not workout. She denies chest pain, shortness of breath, dizziness.   She is on cholesterol medication and denies myalgias. Her cholesterol is at goal. The cholesterol last visit was:   Lab Results  Component Value Date   CHOL 152 06/11/2017   HDL 48 (L) 06/11/2017   LDLCALC 78 06/11/2017   TRIG 165 (H) 06/11/2017   CHOLHDL 3.2 06/11/2017    She has not been working on diet and exercise for glucose management (on metformin 500 mg BID), and denies foot ulcerations, increased appetite, nausea, paresthesia of the feet, polydipsia, polyuria, visual disturbances, vomiting and weight loss. Last A1C in the office was:  Lab Results  Component Value Date   HGBA1C 5.3 06/10/2016   Patient is on Vitamin D supplement.   Lab Results  Component Value Date   VD25OH 25 (L) 06/11/2017        Current Medications:  Current Outpatient Medications on File Prior to Visit  Medication Sig  . amLODipine (NORVASC) 10 MG tablet TAKE 1 TABLET BY MOUTH EVERY DAY  . benazepril (LOTENSIN) 40 MG tablet TAKE 1 TABLET DAILY FOR BP - NEED OV BEFORE FURTHER REFILLS  . hydrochlorothiazide (HYDRODIURIL) 25 MG tablet TAKE 1 TABLET(25 MG) BY MOUTH DAILY  . pravastatin (PRAVACHOL) 40 MG tablet TAKE 1 TABLET BY MOUTH EVERY DAY AT BEDTIME FOR CHOLESTEROL   No current facility-administered medications on file prior to visit.      Allergies:  Allergies  Allergen Reactions  . Tussionex Pennkinetic Er [Hydrocod Polst-Cpm Polst  Er]     causes headache     Medical History:  Past Medical History:  Diagnosis Date  . Allergic rhinitis   . Asthma   . DM (diabetes mellitus) (HCC)    borderline  . High cholesterol   . Hypertension   . Iron deficiency anemia   . Low blood potassium    Family history- Reviewed and unchanged Social history-  Reviewed and unchanged   Review of Systems:  Review of Systems  Constitutional: Negative for malaise/fatigue and weight loss.  HENT: Negative for hearing loss and tinnitus.   Eyes: Negative for blurred vision and double vision.  Respiratory: Negative for cough, shortness of breath and wheezing.   Cardiovascular: Negative for chest pain, palpitations, orthopnea, claudication and leg swelling.  Gastrointestinal: Negative for abdominal pain, blood in stool, constipation, diarrhea, heartburn, melena, nausea and vomiting.  Genitourinary: Negative.   Musculoskeletal: Negative for joint pain and myalgias.  Skin: Negative for rash.  Neurological: Negative for dizziness, tingling, sensory change, weakness and headaches.  Endo/Heme/Allergies: Negative for polydipsia.  Psychiatric/Behavioral: Negative.   All other systems reviewed and are negative.     Physical Exam: BP 136/90   Pulse 86   Temp (!) 97.3 F (36.3 C)   Resp 16   Ht 5' (1.524 m)   Wt 206 lb 12.8 oz (93.8 kg)   SpO2 97%   BMI 40.39 kg/m  Wt Readings from Last 3 Encounters:  02/10/18 206 lb 12.8 oz (93.8 kg)  08/27/17 197 lb (89.4 kg)  06/11/17 193 lb 9.6 oz (87.8 kg)   General Appearance: Well nourished, in no apparent distress. Eyes: PERRLA, EOMs, conjunctiva no swelling or erythema Sinuses: No Frontal/maxillary tenderness ENT/Mouth: Ext aud canals clear, TMs without erythema, bulging. No erythema, swelling, or exudate on post pharynx.  Tonsils not swollen or erythematous. Hearing normal.  Neck: Supple, thyroid normal.  Respiratory: Respiratory effort normal, BS equal bilaterally without rales, rhonchi, wheezing or stridor.  Cardio: RRR with no MRGs. Brisk peripheral pulses without edema.  Abdomen: Soft, + BS.  Non tender, no guarding, rebound, hernias, masses. Lymphatics: Non tender without lymphadenopathy.  Musculoskeletal: Full ROM, 5/5 strength, Normal gait Skin: Warm, dry without rashes, lesions, ecchymosis.   Neuro: Cranial nerves intact. No cerebellar symptoms.  Psych: Awake and oriented X 3, normal affect, Insight and Judgment appropriate.    Dan MakerAshley C Jonavan Vanhorn, NP 8:56 AM [facility]

## 2018-02-10 ENCOUNTER — Ambulatory Visit: Payer: BLUE CROSS/BLUE SHIELD | Admitting: Adult Health

## 2018-02-10 ENCOUNTER — Other Ambulatory Visit: Payer: Self-pay

## 2018-02-10 ENCOUNTER — Encounter: Payer: Self-pay | Admitting: Adult Health

## 2018-02-10 VITALS — BP 136/90 | HR 86 | Temp 97.3°F | Resp 16 | Ht 60.0 in | Wt 203.8 lb

## 2018-02-10 DIAGNOSIS — E559 Vitamin D deficiency, unspecified: Secondary | ICD-10-CM

## 2018-02-10 DIAGNOSIS — I10 Essential (primary) hypertension: Secondary | ICD-10-CM

## 2018-02-10 DIAGNOSIS — Z79899 Other long term (current) drug therapy: Secondary | ICD-10-CM

## 2018-02-10 DIAGNOSIS — D509 Iron deficiency anemia, unspecified: Secondary | ICD-10-CM

## 2018-02-10 DIAGNOSIS — E782 Mixed hyperlipidemia: Secondary | ICD-10-CM

## 2018-02-10 DIAGNOSIS — R7309 Other abnormal glucose: Secondary | ICD-10-CM | POA: Diagnosis not present

## 2018-02-10 MED ORDER — BISOPROLOL-HYDROCHLOROTHIAZIDE 10-6.25 MG PO TABS
1.0000 | ORAL_TABLET | Freq: Every day | ORAL | 1 refills | Status: DC
Start: 2018-02-10 — End: 2018-04-19

## 2018-02-10 MED ORDER — METFORMIN HCL ER 500 MG PO TB24: ORAL_TABLET | ORAL | 0 refills | Status: DC

## 2018-02-10 NOTE — Patient Instructions (Addendum)
  Goal 198 lb    Aim for 7+ servings of fruits and vegetables daily  80+ fluid ounces of water or unsweet tea for healthy kidneys  Limit animal fats in diet for cholesterol and heart health - choose grass fed whenever available  Aim for low stress - take time to unwind and care for your mental health  Aim for 150 min of moderate intensity exercise weekly for heart health, and weights twice weekly for bone health  Aim for 7-9 hours of sleep daily    Drink 1/2 your body weight in fluid ounces of water daily; drink a tall glass of water 30 min before meals  Don't eat until you're stuffed- listen to your stomach and eat until you are 80% full   Try eating off of a salad plate; wait 10 min after finishing before going back for seconds  Start by eating the vegetables on your plate; aim for 96%50% of your meals to be fruits or vegetables  Then eat your protein - lean meats (grass fed if possible), fish, beans, nuts in moderation  Eat your carbs/starch last ONLY if you still are hungry. If you can, stop before finishing it all  Avoid sugar and flour - the closer it looks to it's original form in nature, typically the better it is for you  Splurge in moderation - "assign" days when you get to splurge and have the "bad stuff" - I like to follow a 80% - 20% plan- "good" choices 80 % of the time, "bad" choices in moderation 20% of the time  Simple equation is: Calories out > calories in = weight loss - even if you eat the bad stuff, if you limit portions, you will still lose weight       When it comes to diets, agreement about the perfect plan isn't easy to find, even among the experts. Experts at the [employer] developed an idea known as the Healthy Eating Plate. Just imagine a plate divided into logical, healthy portions.  The emphasis is on diet quality:  Load up on vegetables and fruits - one-half of your plate: Aim for color and variety, and remember that  potatoes don't count.  Go for whole grains - one-quarter of your plate: Whole wheat, barley, wheat berries, quinoa, oats, brown rice, and foods made with them. If you want pasta, go with whole wheat pasta.  Protein power - one-quarter of your plate: Fish, chicken, beans, and nuts are all healthy, versatile protein sources. Limit red meat.  The diet, however, does go beyond the plate, offering a few other suggestions.  Use healthy plant oils, such as olive, canola, soy, corn, sunflower and peanut. Check the labels, and avoid partially hydrogenated oil, which have unhealthy trans fats.  If you're thirsty, drink water. Coffee and tea are good in moderation, but skip sugary drinks and limit milk and dairy products to one or two daily servings.  The type of carbohydrate in the diet is more important than the amount. Some sources of carbohydrates, such as vegetables, fruits, whole grains, and beans--are healthier than others.  Finally, stay active.

## 2018-02-11 ENCOUNTER — Other Ambulatory Visit: Payer: Self-pay | Admitting: Internal Medicine

## 2018-02-11 DIAGNOSIS — R921 Mammographic calcification found on diagnostic imaging of breast: Secondary | ICD-10-CM

## 2018-02-11 LAB — CBC WITH DIFFERENTIAL/PLATELET
BASOS ABS: 30 {cells}/uL (ref 0–200)
Basophils Relative: 0.5 %
EOS ABS: 132 {cells}/uL (ref 15–500)
Eosinophils Relative: 2.2 %
HEMATOCRIT: 33.7 % — AB (ref 35.0–45.0)
HEMOGLOBIN: 10.6 g/dL — AB (ref 11.7–15.5)
LYMPHS ABS: 2208 {cells}/uL (ref 850–3900)
MCH: 24.3 pg — AB (ref 27.0–33.0)
MCHC: 31.5 g/dL — AB (ref 32.0–36.0)
MCV: 77.3 fL — ABNORMAL LOW (ref 80.0–100.0)
MPV: 10.2 fL (ref 7.5–12.5)
Monocytes Relative: 9.5 %
NEUTROS ABS: 3060 {cells}/uL (ref 1500–7800)
Neutrophils Relative %: 51 %
Platelets: 318 10*3/uL (ref 140–400)
RBC: 4.36 10*6/uL (ref 3.80–5.10)
RDW: 14.4 % (ref 11.0–15.0)
Total Lymphocyte: 36.8 %
WBC mixed population: 570 cells/uL (ref 200–950)
WBC: 6 10*3/uL (ref 3.8–10.8)

## 2018-02-11 LAB — COMPLETE METABOLIC PANEL WITH GFR
AG Ratio: 1.5 (calc) (ref 1.0–2.5)
ALBUMIN MSPROF: 4.4 g/dL (ref 3.6–5.1)
ALT: 28 U/L (ref 6–29)
AST: 20 U/L (ref 10–35)
Alkaline phosphatase (APISO): 47 U/L (ref 33–115)
BILIRUBIN TOTAL: 0.5 mg/dL (ref 0.2–1.2)
BUN: 8 mg/dL (ref 7–25)
CHLORIDE: 104 mmol/L (ref 98–110)
CO2: 27 mmol/L (ref 20–32)
Calcium: 9.7 mg/dL (ref 8.6–10.2)
Creat: 0.57 mg/dL (ref 0.50–1.10)
GFR, Est African American: 129 mL/min/{1.73_m2} (ref >=60)
GFR, Est Non African American: 111 mL/min/{1.73_m2} (ref >=60)
GLOBULIN: 2.9 g/dL (ref 1.9–3.7)
Glucose, Bld: 111 mg/dL — ABNORMAL HIGH (ref 65–99)
Potassium: 3.9 mmol/L (ref 3.5–5.3)
SODIUM: 139 mmol/L (ref 135–146)
Total Protein: 7.3 g/dL (ref 6.1–8.1)

## 2018-02-11 LAB — LIPID PANEL
CHOL/HDL RATIO: 3.5 (calc) (ref <5.0)
Cholesterol: 185 mg/dL (ref <200)
HDL: 53 mg/dL (ref >50)
LDL Cholesterol (Calc): 110 mg/dL (calc) — ABNORMAL HIGH
Non-HDL Cholesterol (Calc): 132 mg/dL (calc) — ABNORMAL HIGH (ref <130)
Triglycerides: 113 mg/dL (ref <150)

## 2018-02-11 LAB — IRON,TIBC AND FERRITIN PANEL
%SAT: 21 % (calc) (ref 16–45)
FERRITIN: 8 ng/mL — AB (ref 16–232)
IRON: 92 ug/dL (ref 40–190)
TIBC: 447 mcg/dL (calc) (ref 250–450)

## 2018-02-11 LAB — TEST AUTHORIZATION

## 2018-02-11 LAB — HEMOGLOBIN A1C
EAG (MMOL/L): 6.8 (calc)
HEMOGLOBIN A1C: 5.9 %{Hb} — AB (ref <5.7)
Mean Plasma Glucose: 123 (calc)

## 2018-02-11 LAB — TSH: TSH: 1.45 mIU/L

## 2018-02-11 LAB — VITAMIN D 25 HYDROXY (VIT D DEFICIENCY, FRACTURES): VIT D 25 HYDROXY: 19 ng/mL — AB (ref 30–100)

## 2018-03-01 ENCOUNTER — Other Ambulatory Visit: Payer: Self-pay | Admitting: Internal Medicine

## 2018-03-01 DIAGNOSIS — I10 Essential (primary) hypertension: Secondary | ICD-10-CM

## 2018-03-03 ENCOUNTER — Emergency Department (HOSPITAL_COMMUNITY): Payer: BLUE CROSS/BLUE SHIELD

## 2018-03-03 ENCOUNTER — Encounter (HOSPITAL_COMMUNITY): Payer: Self-pay | Admitting: Emergency Medicine

## 2018-03-03 ENCOUNTER — Emergency Department (HOSPITAL_COMMUNITY)
Admission: EM | Admit: 2018-03-03 | Discharge: 2018-03-03 | Disposition: A | Discharge status: Home / Self Care | Payer: BLUE CROSS/BLUE SHIELD | Attending: Emergency Medicine | Admitting: Emergency Medicine

## 2018-03-03 DIAGNOSIS — I10 Essential (primary) hypertension: Secondary | ICD-10-CM | POA: Diagnosis not present

## 2018-03-03 DIAGNOSIS — M940 Chondrocostal junction syndrome [Tietze]: Secondary | ICD-10-CM | POA: Insufficient documentation

## 2018-03-03 DIAGNOSIS — Z79899 Other long term (current) drug therapy: Secondary | ICD-10-CM | POA: Diagnosis not present

## 2018-03-03 DIAGNOSIS — J45909 Unspecified asthma, uncomplicated: Secondary | ICD-10-CM | POA: Diagnosis not present

## 2018-03-03 DIAGNOSIS — R0789 Other chest pain: Secondary | ICD-10-CM | POA: Diagnosis not present

## 2018-03-03 DIAGNOSIS — R079 Chest pain, unspecified: Secondary | ICD-10-CM | POA: Diagnosis not present

## 2018-03-03 LAB — CBC
HCT: 34.1 % — ABNORMAL LOW (ref 36.0–46.0)
Hemoglobin: 10.5 g/dL — ABNORMAL LOW (ref 12.0–15.0)
MCH: 24.8 pg — ABNORMAL LOW (ref 26.0–34.0)
MCHC: 30.8 g/dL (ref 30.0–36.0)
MCV: 80.4 fL (ref 78.0–100.0)
PLATELETS: 335 10*3/uL (ref 150–400)
RBC: 4.24 MIL/uL (ref 3.87–5.11)
RDW: 15.8 % — AB (ref 11.5–15.5)
WBC: 7 10*3/uL (ref 4.0–10.5)

## 2018-03-03 LAB — BASIC METABOLIC PANEL
Anion gap: 10 (ref 5–15)
BUN: 9 mg/dL (ref 6–20)
CALCIUM: 8.9 mg/dL (ref 8.9–10.3)
CO2: 25 mmol/L (ref 22–32)
CREATININE: 0.71 mg/dL (ref 0.44–1.00)
Chloride: 103 mmol/L (ref 98–111)
GFR calc Af Amer: >60 mL/min (ref >60)
Glucose, Bld: 115 mg/dL — ABNORMAL HIGH (ref 70–99)
Potassium: 3.2 mmol/L — ABNORMAL LOW (ref 3.5–5.1)
Sodium: 138 mmol/L (ref 135–145)

## 2018-03-03 LAB — I-STAT BETA HCG BLOOD, ED (MC, WL, AP ONLY)

## 2018-03-03 LAB — I-STAT TROPONIN, ED: Troponin i, poc: 0 ng/mL (ref 0.00–0.08)

## 2018-03-03 MED ORDER — POTASSIUM CHLORIDE CRYS ER 20 MEQ PO TBCR
40.0000 meq | EXTENDED_RELEASE_TABLET | Freq: Once | ORAL | Status: AC
  Administered 2018-03-03: 40 meq via ORAL
  Filled 2018-03-03: qty 2

## 2018-03-03 MED ORDER — IBUPROFEN 800 MG PO TABS
800.0000 mg | ORAL_TABLET | Freq: Once | ORAL | Status: AC
  Administered 2018-03-03: 800 mg via ORAL
  Filled 2018-03-03: qty 1

## 2018-03-03 MED ORDER — CYCLOBENZAPRINE HCL 10 MG PO TABS
10.0000 mg | ORAL_TABLET | Freq: Once | ORAL | Status: AC
  Administered 2018-03-03: 10 mg via ORAL
  Filled 2018-03-03: qty 1

## 2018-03-03 MED ORDER — CYCLOBENZAPRINE HCL 10 MG PO TABS: 10.0000 mg | ORAL_TABLET | Freq: Two times a day (BID) | ORAL | 0 refills | Status: DC | PRN

## 2018-03-03 MED ORDER — IBUPROFEN 600 MG PO TABS: 600.0000 mg | ORAL_TABLET | Freq: Four times a day (QID) | ORAL | 0 refills | Status: DC | PRN

## 2018-03-03 MED ORDER — IBUPROFEN 600 MG PO TABS: 600.0000 mg | ORAL_TABLET | Freq: Four times a day (QID) | ORAL | 0 refills | Status: AC | PRN

## 2018-03-03 NOTE — ED Triage Notes (Signed)
Pt reports L sided chest pain onset this morning, she equates pain to "a pulled muscle." Denies medical hx

## 2018-03-03 NOTE — ED Notes (Signed)
ED Provider at bedside. 

## 2018-03-03 NOTE — ED Notes (Addendum)
Patient transported to X-ray 

## 2018-03-03 NOTE — ED Notes (Signed)
Pt back to [facility] C Room 24 from x-ray

## 2018-03-03 NOTE — ED Provider Notes (Signed)
[facility] Provider Note   CSN: [id number] Arrival date & time: 03/03/18  [id number]     History   Chief Complaint Chief Complaint  Patient presents with  . Chest Pain    HPI Alexis Sabaliffany N Marquez is a 47 y.o. female.  The history is provided by the patient. No language interpreter was used.  Chest Pain       47 year old female with history of hypertension, asthma, obesity presenting for evaluation of chest pain.  Patient was in the process of getting ready for her work this morning when she developed pain to the left side of her chest.  She described the pain as a sharp sensation, felt like a muscle pull, intense and persist for the past hour.  Pain improves when she lays stills, worsening with any kind of movement.  Pain is nonradiating.  She denies any associated fever, chills, lightheadedness, dizziness, shortness of breath, productive cough, hemoptysis, arm pain, abdominal pain or back pain.  She is not a smoker and denies any significant cardiac history.  No prior history of PE or DVT, no recent surgery, prolonged bedrest, active cancer or hemoptysis.  She denies any specific treatment tried.  Patient states she was fine this morning when she woke up.  Past Medical History:  Diagnosis Date  . Allergic rhinitis   . Asthma   . DM (diabetes mellitus) (HCC)    borderline  . High cholesterol   . Hypertension   . Iron deficiency anemia   . Low blood potassium     Patient Active Problem List   Diagnosis Date Noted  . Morbid obesity (HCC) 06/11/2017  . Asthma 06/09/2015  . Medication management 09/30/2013  . Vitamin D deficiency 09/29/2013  . Menorrhagia with regular cycle 09/27/2013  . Essential hypertension 09/07/2013  . Mixed hyperlipidemia 09/07/2013  . Other abnormal glucose 09/07/2013  . Iron deficiency anemia 04/19/2013    Past Surgical History:  Procedure Laterality Date  . CESAREAN SECTION     x 4  . TUBAL LIGATION  1996  .  UMBILICAL HERNIA REPAIR  1977     OB History   None      Home Medications    Prior to Admission medications   Medication Sig Start Date End Date Taking? Authorizing Provider  amLODipine (NORVASC) 10 MG tablet TAKE 1 TABLET BY MOUTH EVERY DAY 12/08/17   Quentin Mullingollier, Amanda, PA-C  benazepril (LOTENSIN) 40 MG tablet TAKE 1 TABLET DAILY FOR BP - NEED OV BEFORE FURTHER REFILLS 03/01/18   Judd Gaudierorbett, Ashley, NP  bisoprolol-hydrochlorothiazide [facility](ZIAC) 10-6.25 MG tablet Take 1 tablet by mouth daily. 02/10/18   Judd Gaudierorbett, Ashley, NP  hydrochlorothiazide (HYDRODIURIL) 25 MG tablet TAKE 1 TABLET(25 MG) BY MOUTH DAILY 09/09/17   Quentin Mullingollier, Amanda, PA-C  metFORMIN (GLUCOPHAGE-XR) 500 MG 24 hr tablet TAKE 1 TO 2 TABLETS BY MOUTH TWICE DAILY WITH A MEAL FOR DIABETES 02/10/18   Judd Gaudierorbett, Ashley, NP  pravastatin (PRAVACHOL) 40 MG tablet TAKE 1 TABLET BY MOUTH EVERY DAY AT BEDTIME FOR CHOLESTEROL 09/15/17   Quentin Mullingollier, Amanda, PA-C    Family History Family History  Problem Relation Age of Onset  . Diabetes Mother   . Stroke Mother   . Mitral valve prolapse Mother   . Diabetes Father   . Breast cancer Maternal Aunt   . Diabetes Maternal Aunt   . Heart disease Maternal Grandmother        great grandmother  . CAD Neg Hx     Social  History Social History   Tobacco Use  . Smoking status: Never Smoker  . Smokeless tobacco: Never Used  Substance Use Topics  . Alcohol use: No  . Drug use: No     Allergies   Tussionex pennkinetic er [hydrocod polst-cpm polst er]   Review of Systems Review of Systems  Cardiovascular: Positive for chest pain.  All other systems reviewed and are negative.    Physical Exam Updated Vital Signs BP 136/82 (BP Location: Right Wrist)   Pulse 73   Temp 98.5 F (36.9 C) (Oral)   Resp 14   Ht 5' (1.524 m)   Wt 88.5 kg (195 lb)   LMP 02/09/2018 (Within Days)   SpO2 100%   BMI 38.08 kg/m   Physical Exam  Constitutional: She is oriented to person, place, and time. She appears  well-developed and well-nourished. No distress.  Obese female lying in bed in no acute discomfort.  HENT:  Head: Atraumatic.  Eyes: Conjunctivae are normal.  Neck: Neck supple.  Cardiovascular: Normal rate, regular rhythm, intact distal pulses and normal pulses.  Pulmonary/Chest: Effort normal and breath sounds normal. She has no decreased breath sounds. She has no wheezes. She has no rhonchi. She has no rales. She exhibits tenderness (Focal tenderness to left upper chest wall on palpation without any overlying skin changes, crepitus or emphysema.).  Abdominal: Soft. There is no tenderness.  Musculoskeletal: Normal range of motion.       Right lower leg: She exhibits no edema.       Left lower leg: She exhibits no edema.  Neurological: She is alert and oriented to person, place, and time.  Skin: No rash noted.  Psychiatric: She has a normal mood and affect.  Nursing note and vitals reviewed.    ED Treatments / Results  Labs (all labs ordered are listed, but only abnormal results are displayed) Labs Reviewed  BASIC METABOLIC PANEL - Abnormal; Notable for the following components:      Result Value   Potassium 3.2 (*)    Glucose, Bld 115 (*)    All other components within normal limits  CBC - Abnormal; Notable for the following components:   Hemoglobin 10.5 (*)    HCT 34.1 (*)    MCH 24.8 (*)    RDW 15.8 (*)    All other components within normal limits  I-STAT TROPONIN, ED  I-STAT BETA HCG BLOOD, ED ([facility], [facility], [facility] ONLY)    EKG EKG Interpretation  Date/Time:  Tuesday March 03 2018 06:45:55 EDT Ventricular Rate:  73 PR Interval:    QRS Duration: 109 QT Interval:  421 QTC Calculation: 464 R Axis:   59 Text Interpretation:  Sinus rhythm Nonspecific T wave abnormality Confirmed by Cathren Laine ([id number]) on 03/03/2018 8:56:17 AM   ED ECG REPORT   Date: 03/03/2018  Rate: 73  Rhythm: normal sinus rhythm  QRS Axis: normal  Intervals: normal  ST/T Wave abnormalities:  nonspecific T wave changes  Conduction Disutrbances:none  Narrative Interpretation:   Old EKG Reviewed: unchanged  I have personally reviewed the EKG tracing and agree with the computerized printout as noted.   Radiology Dg Chest 2 View  Result Date: 03/03/2018 CLINICAL DATA:  47 year old female with left upper chest and left arm pain EXAM: CHEST - 2 VIEW COMPARISON:  Prior chest x-ray 12/27/2015 FINDINGS: The lungs are clear and negative for focal airspace consolidation, pulmonary edema or suspicious pulmonary nodule. No pleural effusion or pneumothorax. Cardiac and mediastinal contours are within normal  limits. No acute fracture or lytic or blastic osseous lesions. The visualized upper abdominal bowel gas pattern is unremarkable. IMPRESSION: Negative chest x-ray. Electronically Signed   By: Malachy Moan M.D.   On: 03/03/2018 07:42    Procedures Procedures (including critical care time)  Medications Ordered in ED Medications  ibuprofen (ADVIL,MOTRIN) tablet 800 mg (800 mg Oral Given 03/03/18 0714)  cyclobenzaprine (FLEXERIL) tablet 10 mg (10 mg Oral Given 03/03/18 0714)  potassium chloride SA (K-DUR,KLOR-CON) CR tablet 40 mEq (40 mEq Oral Given 03/03/18 0848)     Initial Impression / Assessment and Plan / ED Course  I have reviewed the triage vital signs and the nursing notes.  Pertinent labs & imaging results that were available during my care of the patient were reviewed by me and considered in my medical decision making (see chart for details).     BP (!) 110/91   Pulse 65   Temp 98.5 F (36.9 C) (Oral)   Resp 19   Ht 5' (1.524 m)   Wt 88.5 kg (195 lb)   LMP 02/09/2018 (Within Days)   SpO2 100%   BMI 38.08 kg/m    Final Clinical Impressions(s) / ED Diagnoses   Final diagnoses:  Costochondritis, acute    ED Discharge Orders    None     7:13 AM Patient here with reproducible left-sided chest wall pain atypical for ACS.  Will provide symptomatic treatment.   She is PERC negative, doubt PE.  Work-up initiated.  7:47 AM EKG without concerning changes, normal troponin, labs are mostly reassuring with a mildly hypokalemic finding with a potassium of 3.2 will give supplementation.  Pregnancy test negative chest x-ray unremarkable.  Patient given ibuprofen, and Flexeril for her chest wall pain.  9:03 AM Patient report improvement of symptoms.  At this time she is stable for discharge.  Will prescribe anti-inflammatory medication and muscle relaxant to use as needed.  Return precautions discussed.  Outpatient follow-up with PCP recommended.   Fayrene Helper, PA-C 03/03/18 1610    Cathren Laine, MD 03/03/18 1255

## 2018-03-04 ENCOUNTER — Other Ambulatory Visit: Payer: Self-pay | Admitting: Adult Health

## 2018-03-18 ENCOUNTER — Other Ambulatory Visit: Payer: Self-pay | Admitting: Internal Medicine

## 2018-03-18 ENCOUNTER — Other Ambulatory Visit: Payer: Self-pay | Admitting: *Deleted

## 2018-03-18 MED ORDER — METFORMIN HCL ER 500 MG PO TB24: ORAL_TABLET | ORAL | 0 refills | Status: DC

## 2018-03-23 ENCOUNTER — Other Ambulatory Visit: Payer: Self-pay | Admitting: Physician Assistant

## 2018-03-23 DIAGNOSIS — I10 Essential (primary) hypertension: Secondary | ICD-10-CM

## 2018-03-28 ENCOUNTER — Other Ambulatory Visit: Payer: Self-pay | Admitting: Physician Assistant

## 2018-04-07 ENCOUNTER — Ambulatory Visit
Admission: RE | Admit: 2018-04-07 | Discharge: 2018-04-07 | Disposition: A | Discharge status: Home / Self Care | Payer: BLUE CROSS/BLUE SHIELD | Source: Ambulatory Visit | Attending: Internal Medicine | Admitting: Internal Medicine

## 2018-04-07 DIAGNOSIS — R922 Inconclusive mammogram: Secondary | ICD-10-CM | POA: Diagnosis not present

## 2018-04-07 DIAGNOSIS — R921 Mammographic calcification found on diagnostic imaging of breast: Secondary | ICD-10-CM

## 2018-04-19 DIAGNOSIS — I10 Essential (primary) hypertension: Secondary | ICD-10-CM

## 2018-04-19 MED ORDER — BISOPROLOL-HYDROCHLOROTHIAZIDE 10-6.25 MG PO TABS: 1.0000 | ORAL_TABLET | Freq: Every day | ORAL | 1 refills | Status: DC

## 2018-04-19 MED ORDER — BENAZEPRIL HCL 40 MG PO TABS: 40.0000 mg | ORAL_TABLET | Freq: Every day | ORAL | 0 refills | Status: DC

## 2018-05-29 DIAGNOSIS — R300 Dysuria: Secondary | ICD-10-CM | POA: Diagnosis not present

## 2018-06-21 NOTE — Progress Notes (Signed)
Complete Physical  Assessment and Plan:  Encounter for general adult medical examination with abnormal findings 1 year  Essential hypertension - continue medications, DASH diet, exercise and monitor at home. Call if greater than 130/80.  -     CBC with Differential/Platelet -     CMP/GFR -     TSH -     Urinalysis, Routine w reflex microscopic -     Microalbumin / creatinine urine ratio -     EKG 12-Lead  Morbid obesity (HCC) - long discussion about weight loss, diet, and exercise -recommended diet heavy in fruits and veggies and low in animal meats, cheeses, and dairy products -     TSH  Mixed hyperlipidemia -continue medications, check lipids, decrease fatty foods, increase activity.  -     Lipid panel  Prediabetes Controlled on metformin  Discussed disease and risks Discussed diet/exercise, weight management  A1C  Medication management -     Magnesium  Iron deficiency anemia, unspecified iron deficiency anemia type -     Iron,Total/Total Iron Binding Cap -     Vitamin B12  Vitamin D deficiency -     VITAMIN D 25 Hydroxy (Vit-D Deficiency, Fractures)  Uncomplicated asthma, unspecified asthma severity, unspecified whether persistent Well controlled, albuterol PRN but rarely uses, avoid triggers  Morbid obesity (HCC) - long discussion about weight loss, diet, and exercise -recommended diet heavy in fruits and veggies and low in animal meats, cheeses, and dairy products  Encounter for screening colonoscopy She will check with insurance about coverage for <50  -     Ambulatory referral to Gastroenterology  Discussed med's effects and SE's. Screening labs and tests as requested with regular follow-up as recommended.  No future appointments.  HPI  47 y.o. AA female  presents for a complete physical. She has Iron deficiency anemia; Essential hypertension; Mixed hyperlipidemia; Prediabetes; Menorrhagia with regular cycle; Vitamin D deficiency; Medication  management; Asthma; and Morbid obesity (HCC) on their problem list. She has 4 kids, 5 grandkids.   BMI is Body mass index is 40.8 kg/m., she has not been working on diet and exercise. She would be willing to work on weight, goal of <190 lb set, discussed start walking 15 min daily.  Wt Readings from Last 3 Encounters:  06/22/18 202 lb (91.6 kg)  03/03/18 195 lb (88.5 kg)  02/10/18 203 lb 12.8 oz (92.4 kg)   Her blood pressure has been controlled at home, today their BP is BP: 130/76.  She does not workout. She denies chest pain, shortness of breath, dizziness.  She has a history of asthma, hasn't had problems recently, may have some difficulty on very hot humid days. She has had some GERD but doing well off of medications.    Has been having occ night sweats, just around her head/neck, not every night, no soaking shirts. Normal Echo 2014, mild LVH, normal EF 55-60%.  She is on cholesterol medication (pravastatin 40 mg daily) and denies myalgias. Her cholesterol is not at goal. The cholesterol last visit was:  Lab Results  Component Value Date   CHOL 185 02/10/2018   HDL 53 02/10/2018   LDLCALC 110 (H) 02/10/2018   TRIG 113 02/10/2018   CHOLHDL 3.5 02/10/2018  . She has been working on diet and exercise for diabetes which has been controlled with oral metformin, she is not on bASA, she is on ACE/ARB and denies foot ulcerations, hyperglycemia, hypoglycemia , increased appetite, nausea, paresthesia of the feet, polydipsia, polyuria, visual disturbances,  vomiting and weight loss. Last A1C in the office was:  Lab Results  Component Value Date   HGBA1C 5.9 (H) 02/10/2018   Lab Results  Component Value Date   GFRAA >60 03/03/2018   Patient is on Vitamin D supplement, has not been taking it   Lab Results  Component Value Date   VD25OH 18 (L) 02/10/2018       Current Medications:  Current Outpatient Medications on File Prior to Visit  Medication Sig Dispense Refill  . amLODipine  (NORVASC) 10 MG tablet TAKE 1 TABLET BY MOUTH EVERY DAY 90 tablet 0  . benazepril (LOTENSIN) 40 MG tablet Take 1 tablet (40 mg total) by mouth daily. 90 tablet 0  . bisoprolol-hydrochlorothiazide (ZIAC) 10-6.25 MG tablet Take 1 tablet by mouth daily. 90 tablet 1  . hydrochlorothiazide (HYDRODIURIL) 25 MG tablet TAKE 1 TABLET BY MOUTH EVERY DAY 90 tablet 1  . ibuprofen (ADVIL,MOTRIN) 600 MG tablet Take 1 tablet (600 mg total) by mouth every 6 (six) hours as needed. 30 tablet 0  . metFORMIN (GLUCOPHAGE-XR) 500 MG 24 hr tablet TAKE 1 TO 2 TABLETS BY MOUTH TWICE DAILY WITH A MEAL FOR DIABETES 360 tablet 0  . pravastatin (PRAVACHOL) 40 MG tablet TAKE 1 TABLET BY MOUTH EVERY DAY AT BEDTIME FOR CHOLESTEROL 90 tablet 1  . cyclobenzaprine (FLEXERIL) 10 MG tablet Take 1 tablet (10 mg total) by mouth 2 (two) times daily as needed for muscle spasms. 20 tablet 0  . ferrous sulfate 325 (65 FE) MG EC tablet Take 325 mg by mouth 2 (two) times daily.     No current facility-administered medications on file prior to visit.     Health Maintenance:   Immunization History  Administered Date(s) Administered  . Influenza Split 06/09/2015  . Influenza, Seasonal, Injecte, Preservative Fre 06/10/2016  . Influenza-Unspecified 05/26/2018  . Pneumococcal-Unspecified 08/13/2007  . Td 11/10/2005, 06/10/2016   Tetanus: 2017 Pneumovax: 2009 Flu vaccine: 04/2018 got at work  Patient's last menstrual period was 06/12/2018 (approximate). Pap 07/2017, sees GYN annually and gets there MGM: 09/2017, had diagnostic R mmg x 2 with suspicious area resolved, due routine screening 09/2018 Colonoscopy: will call insurance to check if will cover <50 and get if they do   Last Eye Exam: Dr. ?, got early 2019 - report requested Last Dental Exam: [facility], last 2019  Patient Care Team: Lucky Cowboy, MD as PCP - General (Internal Medicine) Shea Evans, MD as Consulting Physician (Obstetrics and Gynecology) Louis Meckel, MD (Inactive) as Consulting Physician (Gastroenterology) Reece Packer, MD as Consulting Physician (Oncology) Kathleene Hazel, MD as Consulting Physician (Cardiology)  Medical History:  Past Medical History:  Diagnosis Date  . Allergic rhinitis   . Asthma   . DM (diabetes mellitus) (HCC)    borderline  . High cholesterol   . Hypertension   . Iron deficiency anemia   . Low blood potassium    Allergies Allergies  Allergen Reactions  . Tussionex Pennkinetic Er [Hydrocod Polst-Cpm Polst Er]     causes headache    SURGICAL HISTORY She  has a past surgical history that includes Cesarean section; Umbilical hernia repair (1977); and Tubal ligation (1996). FAMILY HISTORY Her family history includes Breast cancer in her maternal aunt; Diabetes in her father, maternal aunt, and mother; Heart disease in her maternal grandmother; Mitral valve prolapse in her mother; Pancreatic cancer in her maternal uncle; Stroke in her mother. SOCIAL HISTORY She  reports that she has never smoked.  She has never used smokeless tobacco. She reports that she does not drink alcohol or use drugs.  Can't take off July- Sept, works at [employer] that [employer], visas  Review of Systems: Review of Systems  Constitutional: Negative for chills, fever and malaise/fatigue.  HENT: Negative for congestion, ear pain and sore throat.   Eyes: Negative.   Respiratory: Negative for cough, shortness of breath and wheezing.   Cardiovascular: Negative for chest pain, palpitations and leg swelling.  Gastrointestinal: Negative for abdominal pain, blood in stool, constipation, diarrhea, heartburn and melena.  Genitourinary: Negative.   Skin: Negative.   Neurological: Negative for dizziness, sensory change, loss of consciousness and headaches.  Psychiatric/Behavioral: Negative for depression. The patient is not nervous/anxious and does not have insomnia.     Physical Exam: Estimated  body mass index is 40.8 kg/m as calculated from the following:   Height as of this encounter: 4\' 11"  (1.499 m).   Weight as of this encounter: 202 lb (91.6 kg). BP 130/76   Pulse 68   Temp (!) 97.5 F (36.4 C)   Ht 4\' 11"  (1.499 m)   Wt 202 lb (91.6 kg)   LMP 06/12/2018 (Approximate)   SpO2 98%   BMI 40.80 kg/m   General Appearance: Well nourished well developed, in no apparent distress.  Eyes: PERRLA, EOMs, conjunctiva no swelling or erythema ENT/Mouth: Ear canals normal without obstruction, swelling, erythema, or discharge.  TMs normal bilaterally with no erythema, bulging, retraction, or loss of landmark.  Oropharynx moist and clear with no exudate, erythema, or swelling.   Neck: Supple, thyroid normal. No bruits.  No cervical adenopathy Respiratory: Respiratory effort normal, Breath sounds clear A&P without wheeze, rhonchi, rales.   Cardio: RRR without murmurs, rubs or gallops. Brisk peripheral pulses without edema.  Chest: symmetric, with normal excursions Breasts: defer to GYN Abdomen: Soft, nontender, no guarding, rebound, hernias, masses, or organomegaly.  Lymphatics: Non tender without lymphadenopathy.  Musculoskeletal: Full ROM all peripheral extremities,5/5 strength, and normal gait.  Skin: Warm, dry without rashes, lesions, ecchymosis. Neuro: Awake and oriented X 3, Cranial nerves intact, reflexes equal bilaterally. Normal muscle tone, no cerebellar symptoms. Sensation intact.  Psych:  normal affect, Insight and Judgment appropriate.   EKG: WNL no changes.  Over 40 minutes of exam, counseling, chart review and critical decision making was performed  Dan Maker 3:30 PM [facility]

## 2018-06-22 ENCOUNTER — Ambulatory Visit: Payer: BLUE CROSS/BLUE SHIELD | Admitting: Adult Health

## 2018-06-22 ENCOUNTER — Encounter: Payer: Self-pay | Admitting: Adult Health

## 2018-06-22 ENCOUNTER — Encounter: Payer: Self-pay | Admitting: Physician Assistant

## 2018-06-22 VITALS — BP 130/76 | HR 68 | Temp 97.5°F | Ht 59.0 in | Wt 202.0 lb

## 2018-06-22 DIAGNOSIS — Z79899 Other long term (current) drug therapy: Secondary | ICD-10-CM

## 2018-06-22 DIAGNOSIS — Z1329 Encounter for screening for other suspected endocrine disorder: Secondary | ICD-10-CM | POA: Diagnosis not present

## 2018-06-22 DIAGNOSIS — Z13220 Encounter for screening for lipoid disorders: Secondary | ICD-10-CM | POA: Diagnosis not present

## 2018-06-22 DIAGNOSIS — R7303 Prediabetes: Secondary | ICD-10-CM

## 2018-06-22 DIAGNOSIS — E559 Vitamin D deficiency, unspecified: Secondary | ICD-10-CM | POA: Diagnosis not present

## 2018-06-22 DIAGNOSIS — N920 Excessive and frequent menstruation with regular cycle: Secondary | ICD-10-CM

## 2018-06-22 DIAGNOSIS — Z0000 Encounter for general adult medical examination without abnormal findings: Secondary | ICD-10-CM

## 2018-06-22 DIAGNOSIS — Z130 Encounter for screening for diseases of the blood and blood-forming organs and certain disorders involving the immune mechanism: Secondary | ICD-10-CM

## 2018-06-22 DIAGNOSIS — I10 Essential (primary) hypertension: Secondary | ICD-10-CM

## 2018-06-22 DIAGNOSIS — Z136 Encounter for screening for cardiovascular disorders: Secondary | ICD-10-CM | POA: Diagnosis not present

## 2018-06-22 DIAGNOSIS — J45909 Unspecified asthma, uncomplicated: Secondary | ICD-10-CM

## 2018-06-22 DIAGNOSIS — Z1389 Encounter for screening for other disorder: Secondary | ICD-10-CM

## 2018-06-22 DIAGNOSIS — D509 Iron deficiency anemia, unspecified: Secondary | ICD-10-CM

## 2018-06-22 DIAGNOSIS — Z0001 Encounter for general adult medical examination with abnormal findings: Secondary | ICD-10-CM

## 2018-06-22 DIAGNOSIS — Z131 Encounter for screening for diabetes mellitus: Secondary | ICD-10-CM | POA: Diagnosis not present

## 2018-06-22 DIAGNOSIS — E782 Mixed hyperlipidemia: Secondary | ICD-10-CM

## 2018-06-22 MED ORDER — ALBUTEROL SULFATE HFA 108 (90 BASE) MCG/ACT IN AERS: 2.0000 | INHALATION_SPRAY | RESPIRATORY_TRACT | 0 refills | Status: AC | PRN

## 2018-06-22 NOTE — Patient Instructions (Addendum)
Please get on daily baby aspirin   Alexis Marquez , Thank you for taking time to come for your Annual Wellness Visit. I appreciate your ongoing commitment to your health goals. Please review the following plan we discussed and let me know if I can assist you in the future.   These are the goals we discussed: Goals    . Weight (lb) < 190 lb (86.2 kg)       This is a list of the screening recommended for you and due dates:  Health Maintenance  Topic Date Due  . Pap Smear  07/12/2020  . Tetanus Vaccine  06/10/2026  . Flu Shot  Completed  . HIV Screening  Discontinued     Know what a healthy weight is for you (roughly BMI <25) and aim to maintain this  Aim for 7+ servings of fruits and vegetables daily  65-80+ fluid ounces of water or unsweet tea for healthy kidneys  Limit to max 1 drink of alcohol per day; avoid smoking/tobacco  Limit animal fats in diet for cholesterol and heart health - choose grass fed whenever available  Avoid highly processed foods, and foods high in saturated/trans fats  Aim for low stress - take time to unwind and care for your mental health  Aim for 150 min of moderate intensity exercise weekly for heart health, and weights twice weekly for bone health  Aim for 7-9 hours of sleep daily       When it comes to diets, agreement about the perfect plan isn't easy to find, even among the experts. Experts at the [facility] [employer] developed an idea known as the Healthy Eating Plate. Just imagine a plate divided into logical, healthy portions.  The emphasis is on diet quality:  Load up on vegetables and fruits - one-half of your plate: Aim for color and variety, and remember that potatoes don't count.  Go for whole grains - one-quarter of your plate: Whole wheat, barley, wheat berries, quinoa, oats, brown rice, and foods made with them. If you want pasta, go with whole wheat pasta.  Protein power - one-quarter of your plate: Fish, chicken,  beans, and nuts are all healthy, versatile protein sources. Limit red meat.  The diet, however, does go beyond the plate, offering a few other suggestions.  Use healthy plant oils, such as olive, canola, soy, corn, sunflower and peanut. Check the labels, and avoid partially hydrogenated oil, which have unhealthy trans fats.  If you're thirsty, drink water. Coffee and tea are good in moderation, but skip sugary drinks and limit milk and dairy products to one or two daily servings.  The type of carbohydrate in the diet is more important than the amount. Some sources of carbohydrates, such as vegetables, fruits, whole grains, and beans--are healthier than others.  Finally, stay active.

## 2018-06-23 LAB — COMPLETE METABOLIC PANEL WITH GFR
AG Ratio: 1.2 (calc) (ref 1.0–2.5)
ALBUMIN MSPROF: 4.2 g/dL (ref 3.6–5.1)
ALKALINE PHOSPHATASE (APISO): 48 U/L (ref 33–115)
ALT: 35 U/L — ABNORMAL HIGH (ref 6–29)
AST: 25 U/L (ref 10–35)
BILIRUBIN TOTAL: 0.3 mg/dL (ref 0.2–1.2)
BUN / CREAT RATIO: 10 (calc) (ref 6–22)
BUN: 6 mg/dL — AB (ref 7–25)
CHLORIDE: 100 mmol/L (ref 98–110)
CO2: 30 mmol/L (ref 20–32)
Calcium: 9.4 mg/dL (ref 8.6–10.2)
Creat: 0.58 mg/dL (ref 0.50–1.10)
GFR, Est African American: 128 mL/min/{1.73_m2} (ref >=60)
GFR, Est Non African American: 111 mL/min/{1.73_m2} (ref >=60)
Globulin: 3.5 g/dL (calc) (ref 1.9–3.7)
Glucose, Bld: 87 mg/dL (ref 65–99)
POTASSIUM: 3.5 mmol/L (ref 3.5–5.3)
SODIUM: 136 mmol/L (ref 135–146)
Total Protein: 7.7 g/dL (ref 6.1–8.1)

## 2018-06-23 LAB — URINALYSIS W MICROSCOPIC + REFLEX CULTURE
BACTERIA UA: NONE SEEN /HPF
BILIRUBIN URINE: NEGATIVE
Glucose, UA: NEGATIVE
Hgb urine dipstick: NEGATIVE
Hyaline Cast: NONE SEEN /LPF
KETONES UR: NEGATIVE
LEUKOCYTE ESTERASE: NEGATIVE
NITRITES URINE, INITIAL: NEGATIVE
Protein, ur: NEGATIVE
RBC / HPF: NONE SEEN /HPF (ref 0–2)
SPECIFIC GRAVITY, URINE: 1.02 (ref 1.001–1.03)
SQUAMOUS EPITHELIAL / LPF: NONE SEEN /HPF (ref <=5)

## 2018-06-23 LAB — CBC WITH DIFFERENTIAL/PLATELET
BASOS ABS: 27 {cells}/uL (ref 0–200)
Basophils Relative: 0.4 %
EOS ABS: 221 {cells}/uL (ref 15–500)
Eosinophils Relative: 3.3 %
HEMATOCRIT: 31 % — AB (ref 35.0–45.0)
HEMOGLOBIN: 10 g/dL — AB (ref 11.7–15.5)
LYMPHS ABS: 2278 {cells}/uL (ref 850–3900)
MCH: 24.7 pg — AB (ref 27.0–33.0)
MCHC: 32.3 g/dL (ref 32.0–36.0)
MCV: 76.5 fL — AB (ref 80.0–100.0)
MPV: 9.8 fL (ref 7.5–12.5)
Monocytes Relative: 8.6 %
Neutro Abs: 3598 cells/uL (ref 1500–7800)
Neutrophils Relative %: 53.7 %
Platelets: 411 10*3/uL — ABNORMAL HIGH (ref 140–400)
RBC: 4.05 10*6/uL (ref 3.80–5.10)
RDW: 14 % (ref 11.0–15.0)
Total Lymphocyte: 34 %
WBC: 6.7 10*3/uL (ref 3.8–10.8)
WBCMIX: 576 {cells}/uL (ref 200–950)

## 2018-06-23 LAB — LIPID PANEL
Cholesterol: 139 mg/dL (ref <200)
HDL: 49 mg/dL — ABNORMAL LOW (ref >50)
LDL Cholesterol (Calc): 73 mg/dL (calc)
Non-HDL Cholesterol (Calc): 90 mg/dL (calc) (ref <130)
Total CHOL/HDL Ratio: 2.8 (calc) (ref <5.0)
Triglycerides: 87 mg/dL (ref <150)

## 2018-06-23 LAB — MICROALBUMIN / CREATININE URINE RATIO
CREATININE, URINE: 148 mg/dL (ref 20–275)
MICROALB UR: 1.5 mg/dL
Microalb Creat Ratio: 10 mcg/mg creat (ref <30)

## 2018-06-23 LAB — VITAMIN B12: VITAMIN B 12: 581 pg/mL (ref 200–1100)

## 2018-06-23 LAB — IRON, TOTAL/TOTAL IRON BINDING CAP
%SAT: 6 % — AB (ref 16–45)
IRON: 26 ug/dL — AB (ref 40–190)
TIBC: 466 mcg/dL (calc) — ABNORMAL HIGH (ref 250–450)

## 2018-06-23 LAB — TSH: TSH: 1.51 mIU/L

## 2018-06-23 LAB — VITAMIN D 25 HYDROXY (VIT D DEFICIENCY, FRACTURES): VIT D 25 HYDROXY: 14 ng/mL — AB (ref 30–100)

## 2018-06-23 LAB — HEMOGLOBIN A1C
HEMOGLOBIN A1C: 5.9 %{Hb} — AB (ref <5.7)
MEAN PLASMA GLUCOSE: 123 (calc)
eAG (mmol/L): 6.8 (calc)

## 2018-06-23 LAB — NO CULTURE INDICATED

## 2018-06-23 LAB — MAGNESIUM: Magnesium: 2 mg/dL (ref 1.5–2.5)

## 2018-07-13 ENCOUNTER — Other Ambulatory Visit: Payer: Self-pay | Admitting: Internal Medicine

## 2018-07-13 ENCOUNTER — Other Ambulatory Visit: Payer: Self-pay | Admitting: Physician Assistant

## 2018-08-04 ENCOUNTER — Other Ambulatory Visit: Payer: Self-pay | Admitting: Adult Health

## 2018-09-15 ENCOUNTER — Other Ambulatory Visit: Payer: Self-pay | Admitting: Internal Medicine

## 2018-09-15 DIAGNOSIS — Z1231 Encounter for screening mammogram for malignant neoplasm of breast: Secondary | ICD-10-CM

## 2018-09-24 DIAGNOSIS — Z6839 Body mass index (BMI) 39.0-39.9, adult: Secondary | ICD-10-CM | POA: Diagnosis not present

## 2018-09-24 DIAGNOSIS — Z01419 Encounter for gynecological examination (general) (routine) without abnormal findings: Secondary | ICD-10-CM | POA: Diagnosis not present

## 2018-09-24 DIAGNOSIS — Z113 Encounter for screening for infections with a predominantly sexual mode of transmission: Secondary | ICD-10-CM | POA: Diagnosis not present

## 2018-09-24 DIAGNOSIS — Z1151 Encounter for screening for human papillomavirus (HPV): Secondary | ICD-10-CM | POA: Diagnosis not present

## 2018-09-28 NOTE — Progress Notes (Deleted)
FOLLOW UP  Assessment and Plan:   Hypertension Continue meds; will work on weight loss Monitor blood pressure at home; patient to call if consistently greater than 130/80 Continue DASH diet.   Reminder to go to the ER if any CP, SOB, nausea, dizziness, severe HA, changes vision/speech, left arm numbness and tingling and jaw pain.  Cholesterol Currently at goal; continue statin; diet for triglycerides discussed Continue low cholesterol diet and exercise.  Check lipid panel.   Prediabetes Continue medication: currently on metformin 500 mg BID Continue diet and exercise.  Perform daily foot/skin check, notify office of any concerning changes.  Check A1C  Obesity with co morbidities Long discussion about weight loss, diet, and exercise Recommended diet heavy in fruits and veggies and low in animal meats, cheeses, and dairy products, appropriate calorie intake Discussed ideal weight for height and initial weight goal (190lb) Will follow up in 3 months  Vitamin D Def Below goal at last check Continue to recommend supplementation to maintain goal of 60-100 Check Vit D level   Continue diet and meds as discussed. Further disposition pending results of labs. Discussed med's effects and SE's.   Over 30 minutes of exam, counseling, chart review, and critical decision making was performed.   Future Appointments  Date Time Provider Department Center  09/30/2018  8:45 AM Judd Gaudier, NP [facility] None  10/12/2018  2:00 PM [facility]  07/01/2019  9:00 AM Judd Gaudier, NP [facility] None    ----------------------------------------------------------------------------------------------------------------------  HPI 48 y.o. female  presents for 6 month follow up on hypertension, cholesterol, prediabetes, morbid obesity and vitamin D deficiency.   *** weight loss meds? ***  Goal weight <190lb  BMI is There is no height or weight on file to calculate BMI.,  she has not been working on diet and exercise.  Wt Readings from Last 3 Encounters:  06/22/18 202 lb (91.6 kg)  03/03/18 195 lb (88.5 kg)  02/10/18 203 lb 12.8 oz (92.4 kg)   Her blood pressure has been controlled at home (once a week, running 136/74), today their BP is    She does not workout. She denies chest pain, shortness of breath, dizziness.   She is on cholesterol medication (pravastatin 40 mg daily) and denies myalgias. Her cholesterol is at goal. The cholesterol last visit was:   Lab Results  Component Value Date   CHOL 139 06/22/2018   HDL 49 (L) 06/22/2018   LDLCALC 73 06/22/2018   TRIG 87 06/22/2018   CHOLHDL 2.8 06/22/2018    She has not been working on diet and exercise for prediabetes (on metformin 500 mg BID), and denies foot ulcerations, increased appetite, nausea, paresthesia of the feet, polydipsia, polyuria, visual disturbances, vomiting and weight loss. Last A1C in the office was:  Lab Results  Component Value Date   HGBA1C 5.9 (H) 06/22/2018   Patient is*** on Vitamin D supplement.   Lab Results  Component Value Date   VD25OH 14 (L) 06/22/2018        Current Medications:  Current Outpatient Medications on File Prior to Visit  Medication Sig  . albuterol (VENTOLIN HFA) 108 (90 Base) MCG/ACT inhaler Inhale 2 puffs into the lungs every 4 (four) hours as needed for wheezing or shortness of breath.  Marland Kitchen amLODipine (NORVASC) 10 MG tablet TAKE 1 TABLET BY MOUTH EVERY DAY  . benazepril (LOTENSIN) 40 MG tablet Take 1 tablet (40 mg total) by mouth daily.  . bisoprolol-hydrochlorothiazide (ZIAC) 10-6.25 MG tablet Take  1 tablet by mouth daily.  . ferrous sulfate 325 (65 FE) MG EC tablet Take 325 mg by mouth 2 (two) times daily.  . hydrochlorothiazide (HYDRODIURIL) 25 MG tablet TAKE 1 TABLET BY MOUTH EVERY DAY  . ibuprofen (ADVIL,MOTRIN) 600 MG tablet Take 1 tablet (600 mg total) by mouth every 6 (six) hours as needed.  . metFORMIN (GLUCOPHAGE-XR) 500 MG 24 hr tablet  TAKE 1 TO 2 TABLETS BY MOUTH TWICE A DAY WITH MEALS  . pravastatin (PRAVACHOL) 40 MG tablet TAKE 1 TABLET BY MOUTH EVERY DAY AT BEDTIME FOR CHOLESTEROL   No current facility-administered medications on file prior to visit.      Allergies:  Allergies  Allergen Reactions  . Tussionex Pennkinetic Er [Hydrocod Polst-Cpm Polst Er]     causes headache     Medical History:  Past Medical History:  Diagnosis Date  . Allergic rhinitis   . Asthma   . DM (diabetes mellitus) (HCC)    borderline  . High cholesterol   . Hypertension   . Iron deficiency anemia   . Low blood potassium    Family history- Reviewed and unchanged Social history- Reviewed and unchanged   Review of Systems:  Review of Systems  Constitutional: Negative for malaise/fatigue and weight loss.  HENT: Negative for hearing loss and tinnitus.   Eyes: Negative for blurred vision and double vision.  Respiratory: Negative for cough, shortness of breath and wheezing.   Cardiovascular: Negative for chest pain, palpitations, orthopnea, claudication and leg swelling.  Gastrointestinal: Negative for abdominal pain, blood in stool, constipation, diarrhea, heartburn, melena, nausea and vomiting.  Genitourinary: Negative.   Musculoskeletal: Negative for joint pain and myalgias.  Skin: Negative for rash.  Neurological: Negative for dizziness, tingling, sensory change, weakness and headaches.  Endo/Heme/Allergies: Negative for polydipsia.  Psychiatric/Behavioral: Negative.   All other systems reviewed and are negative.     Physical Exam: There were no vitals taken for this visit. Wt Readings from Last 3 Encounters:  06/22/18 202 lb (91.6 kg)  03/03/18 195 lb (88.5 kg)  02/10/18 203 lb 12.8 oz (92.4 kg)   General Appearance: Well nourished, in no apparent distress. Eyes: PERRLA, EOMs, conjunctiva no swelling or erythema Sinuses: No Frontal/maxillary tenderness ENT/Mouth: Ext aud canals clear, TMs without erythema,  bulging. No erythema, swelling, or exudate on post pharynx.  Tonsils not swollen or erythematous. Hearing normal.  Neck: Supple, thyroid normal.  Respiratory: Respiratory effort normal, BS equal bilaterally without rales, rhonchi, wheezing or stridor.  Cardio: RRR with no MRGs. Brisk peripheral pulses without edema.  Abdomen: Soft, + BS.  Non tender, no guarding, rebound, hernias, masses. Lymphatics: Non tender without lymphadenopathy.  Musculoskeletal: Full ROM, 5/5 strength, Normal gait Skin: Warm, dry without rashes, lesions, ecchymosis.  Neuro: Cranial nerves intact. No cerebellar symptoms.  Psych: Awake and oriented X 3, normal affect, Insight and Judgment appropriate.    Dan Maker, NP 3:13 PM [facility]

## 2018-09-30 ENCOUNTER — Ambulatory Visit: Payer: Self-pay | Admitting: Adult Health

## 2018-10-02 ENCOUNTER — Other Ambulatory Visit: Payer: Self-pay | Admitting: Physician Assistant

## 2018-10-02 DIAGNOSIS — I10 Essential (primary) hypertension: Secondary | ICD-10-CM

## 2018-10-05 ENCOUNTER — Other Ambulatory Visit: Payer: Self-pay | Admitting: Adult Health

## 2018-10-05 DIAGNOSIS — I10 Essential (primary) hypertension: Secondary | ICD-10-CM

## 2018-10-12 ENCOUNTER — Ambulatory Visit
Admission: RE | Admit: 2018-10-12 | Discharge: 2018-10-12 | Disposition: A | Discharge status: Home / Self Care | Payer: BLUE CROSS/BLUE SHIELD | Source: Ambulatory Visit | Attending: Internal Medicine | Admitting: Internal Medicine

## 2018-10-12 DIAGNOSIS — Z1231 Encounter for screening mammogram for malignant neoplasm of breast: Secondary | ICD-10-CM | POA: Diagnosis not present

## 2018-10-14 DIAGNOSIS — R87610 Atypical squamous cells of undetermined significance on cytologic smear of cervix (ASC-US): Secondary | ICD-10-CM | POA: Diagnosis not present

## 2018-10-16 NOTE — Progress Notes (Signed)
FOLLOW UP  Assessment and Plan:   Hypertension Well controlled at this time with medications Monitor blood pressure at home; patient to call if consistently greater than 130/80 Continue DASH diet.   Reminder to go to the ER if any CP, SOB, nausea, dizziness, severe HA, changes vision/speech, left arm numbness and tingling and jaw pain.  Cholesterol Currently at goal; continue statin; diet for triglycerides discussed Continue low cholesterol diet and exercise.  Check lipid panel.   Prediabetes Continue medication: currently on metformin 500 mg BID Continue diet and exercise.  Perform daily foot/skin check, notify office of any concerning changes.  Check A1C  Obesity with co morbidities Long discussion about weight loss, diet, and exercise Recommended diet heavy in fruits and veggies and low in animal meats, cheeses, and dairy products, appropriate calorie intake Discussed ideal weight for height and initial weight goal (190lb) She plans to start walking and make better choices when eating out, cut down on fried foods Will follow up in 3 months  Vitamin D Def Below goal at last check; she has not started supplement; suggested 5000 IU daily Continue to recommend supplementation to maintain goal of 60-100 Defer Vit D level   Continue diet and meds as discussed. Further disposition pending results of labs. Discussed med's effects and SE's.   Over 30 minutes of exam, counseling, chart review, and critical decision making was performed.   Future Appointments  Date Time Provider Department Center  07/01/2019  9:00 AM Judd Gaudier, NP [facility] None    ----------------------------------------------------------------------------------------------------------------------  HPI 48 y.o. female  presents for 6 month follow up on hypertension, cholesterol, prediabetes, morbid obesity and vitamin D deficiency.   She is a Dietitian.   BMI is Body mass  index is 40.76 kg/m., she has not been working on diet and exercise over the holiday. She has set a goal  Wt Readings from Last 3 Encounters:  10/19/18 201 lb 12.8 oz (91.5 kg)  06/22/18 202 lb (91.6 kg)  03/03/18 195 lb (88.5 kg)   Her blood pressure has been controlled at home (once a week, running 120s/75,etc), today their BP is BP: 128/80  She does not workout. She denies chest pain, shortness of breath, dizziness.   She is on cholesterol medication (pravastatin 40 mg daily) and denies myalgias. Her cholesterol is at goal. The cholesterol last visit was:   Lab Results  Component Value Date   CHOL 139 06/22/2018   HDL 49 (L) 06/22/2018   LDLCALC 73 06/22/2018   TRIG 87 06/22/2018   CHOLHDL 2.8 06/22/2018    She has not been working on diet and exercise for prediabetes (on metformin 500 mg BID), and denies foot ulcerations, increased appetite, nausea, paresthesia of the feet, polydipsia, polyuria, visual disturbances, vomiting and weight loss. Last A1C in the office was:  Lab Results  Component Value Date   HGBA1C 5.9 (H) 06/22/2018   Patient admits she has not started on Vitamin D supplement but is willing to do so:   Lab Results  Component Value Date   VD25OH 14 (L) 06/22/2018        Current Medications:  Current Outpatient Medications on File Prior to Visit  Medication Sig  . albuterol (VENTOLIN HFA) 108 (90 Base) MCG/ACT inhaler Inhale 2 puffs into the lungs every 4 (four) hours as needed for wheezing or shortness of breath.  Marland Kitchen amLODipine (NORVASC) 10 MG tablet TAKE 1 TABLET BY MOUTH EVERY DAY  . benazepril (LOTENSIN) 40 MG  tablet TAKE 1 TABLET BY MOUTH EVERY DAY  . bisoprolol-hydrochlorothiazide (ZIAC) 10-6.25 MG tablet Take 1 tablet by mouth daily.  . hydrochlorothiazide (HYDRODIURIL) 25 MG tablet TAKE 1 TABLET BY MOUTH EVERY DAY  . ibuprofen (ADVIL,MOTRIN) 600 MG tablet Take 1 tablet (600 mg total) by mouth every 6 (six) hours as needed.  . metFORMIN (GLUCOPHAGE-XR)  500 MG 24 hr tablet TAKE 1 TO 2 TABLETS BY MOUTH TWICE A DAY WITH MEALS  . pravastatin (PRAVACHOL) 40 MG tablet TAKE 1 TABLET BY MOUTH EVERY DAY AT BEDTIME FOR CHOLESTEROL  . ferrous sulfate 325 (65 FE) MG EC tablet Take 325 mg by mouth 2 (two) times daily.   No current facility-administered medications on file prior to visit.      Allergies:  Allergies  Allergen Reactions  . Tussionex Pennkinetic Er [Hydrocod Polst-Cpm Polst Er]     causes headache     Medical History:  Past Medical History:  Diagnosis Date  . Allergic rhinitis   . Asthma   . DM (diabetes mellitus) (HCC)    borderline  . High cholesterol   . Hypertension   . Iron deficiency anemia   . Low blood potassium    Family history- Reviewed and unchanged Social history- Reviewed and unchanged   Review of Systems:  Review of Systems  Constitutional: Negative for malaise/fatigue and weight loss.  HENT: Negative for hearing loss and tinnitus.   Eyes: Negative for blurred vision and double vision.  Respiratory: Negative for cough, shortness of breath and wheezing.   Cardiovascular: Negative for chest pain, palpitations, orthopnea, claudication and leg swelling.  Gastrointestinal: Negative for abdominal pain, blood in stool, constipation, diarrhea, heartburn, melena, nausea and vomiting.  Genitourinary: Negative.   Musculoskeletal: Negative for joint pain and myalgias.  Skin: Negative for rash.  Neurological: Negative for dizziness, tingling, sensory change, weakness and headaches.  Endo/Heme/Allergies: Negative for polydipsia.  Psychiatric/Behavioral: Negative.   All other systems reviewed and are negative.    Physical Exam: BP 128/80   Pulse 68   Temp (!) 97.5 F (36.4 C)   Wt 201 lb 12.8 oz (91.5 kg)   LMP 09/29/2018   SpO2 97%   BMI 40.76 kg/m  Wt Readings from Last 3 Encounters:  10/19/18 201 lb 12.8 oz (91.5 kg)  06/22/18 202 lb (91.6 kg)  03/03/18 195 lb (88.5 kg)   General Appearance: Well  nourished, in no apparent distress. Eyes: PERRLA, EOMs, conjunctiva no swelling or erythema Sinuses: No Frontal/maxillary tenderness ENT/Mouth: Ext aud canals clear, TMs without erythema, bulging. No erythema, swelling, or exudate on post pharynx.  Tonsils not swollen or erythematous. Hearing normal.  Neck: Supple, thyroid normal.  Respiratory: Respiratory effort normal, BS equal bilaterally without rales, rhonchi, wheezing or stridor.  Cardio: RRR with no MRGs. Brisk peripheral pulses without edema.  Abdomen: Soft, + BS.  Non tender, no guarding, rebound, hernias, masses. Lymphatics: Non tender without lymphadenopathy.  Musculoskeletal: Full ROM, 5/5 strength, Normal gait Skin: Warm, dry without rashes, lesions, ecchymosis.  Neuro: Cranial nerves intact. No cerebellar symptoms.  Psych: Awake and oriented X 3, normal affect, Insight and Judgment appropriate.    Dan Maker, NP 4:46 PM [facility]

## 2018-10-19 ENCOUNTER — Ambulatory Visit (INDEPENDENT_AMBULATORY_CARE_PROVIDER_SITE_OTHER): Payer: BLUE CROSS/BLUE SHIELD | Admitting: Adult Health

## 2018-10-19 ENCOUNTER — Encounter: Payer: Self-pay | Admitting: Adult Health

## 2018-10-19 VITALS — BP 128/80 | HR 68 | Temp 97.5°F | Wt 201.8 lb

## 2018-10-19 DIAGNOSIS — E782 Mixed hyperlipidemia: Secondary | ICD-10-CM | POA: Diagnosis not present

## 2018-10-19 DIAGNOSIS — I10 Essential (primary) hypertension: Secondary | ICD-10-CM

## 2018-10-19 DIAGNOSIS — Z79899 Other long term (current) drug therapy: Secondary | ICD-10-CM

## 2018-10-19 DIAGNOSIS — D509 Iron deficiency anemia, unspecified: Secondary | ICD-10-CM

## 2018-10-19 DIAGNOSIS — E559 Vitamin D deficiency, unspecified: Secondary | ICD-10-CM | POA: Diagnosis not present

## 2018-10-19 DIAGNOSIS — R7309 Other abnormal glucose: Secondary | ICD-10-CM

## 2018-10-19 NOTE — Patient Instructions (Addendum)
Goals    . DIET - REDUCE FAST FOOD INTAKE     Try to make better choices when eating out - limit fried foods, choose side salad, fruit, baked over tried, etc.     . Exercise 3x per week (20-30 min per time)    . Weight (lb) < 190 lb (86.2 kg)       Look to start on 5000 IU of vitamin D (cholecalciferol) daily - cheapest is from [employer] or [employer], or can go on amazon    Aim for 7+ servings of fruits and vegetables daily  65-80+ fluid ounces of water or unsweet tea for healthy kidneys  Limit to max 1 drink of alcohol per day; avoid smoking/tobacco  Limit animal fats in diet for cholesterol and heart health - choose grass fed whenever available  Avoid highly processed foods, and foods high in saturated/trans fats  Aim for low stress - take time to unwind and care for your mental health  Aim for 150 min of moderate intensity exercise weekly for heart health, and weights twice weekly for bone health  Aim for 7-9 hours of sleep daily   SMALL CHANGES  We want weight loss that will last so you should lose 0.5-2 pounds a week.  THAT IS IT! Please pick THREE things a month to change. Once it is a habit check off the item. Then pick another three items off the list to become habits.  If you are already doing a habit on the list GREAT!  Cross that item off! o Don't drink your calories. Ie, alcohol, soda, fruit juice, and sweet tea.  o Drink more water. Drink a glass when you feel hungry or before each meal.  o Eat breakfast - Complex carb and protein (likeDannon light and fit yogurt, oatmeal, fruit, eggs, [place] bacon). o Measure your cereal.  Eat no more than one cup a day. (ie [place]) o Eat an apple a day. o Add a vegetable a day. o Try a new vegetable a month. o Use Pam! Stop using oil or butter to cook. o Don't finish your plate or use smaller plates. o Share your dessert. o Eat sugar free Jello for dessert or frozen grapes. o Don't eat 2-3 hours before bed. o Switch to whole wheat  bread, pasta, and brown rice. o Make healthier choices when you eat out. No fries! o Pick baked chicken, NOT fried. o Don't forget to SLOW DOWN when you eat. It is not going anywhere.  o Take the stairs. o Park far away in the parking lot o [employer] (or weights) for 10 minutes while watching TV. o Walk at work for 10 minutes during break. o Walk outside 1 time a week with your friend, kids, dog, or significant other. o Start a walking group at church. o Walk the mall as much as you can tolerate.  o Keep a food diary. o Weigh yourself daily. o Walk for 15 minutes 3 days per week. o Cook at home more often and eat out less.  If life happens and you go back to old habits, it is okay.  Just start over. You can do it!   If you experience chest pain, get short of breath, or tired during the exercise, please stop immediately and inform your doctor.

## 2018-10-20 ENCOUNTER — Encounter: Payer: Self-pay | Admitting: Adult Health

## 2018-10-20 ENCOUNTER — Other Ambulatory Visit: Payer: Self-pay | Admitting: Adult Health

## 2018-10-20 ENCOUNTER — Telehealth: Payer: Self-pay

## 2018-10-20 DIAGNOSIS — D509 Iron deficiency anemia, unspecified: Secondary | ICD-10-CM

## 2018-10-20 DIAGNOSIS — K760 Fatty (change of) liver, not elsewhere classified: Secondary | ICD-10-CM | POA: Insufficient documentation

## 2018-10-20 DIAGNOSIS — R945 Abnormal results of liver function studies: Secondary | ICD-10-CM

## 2018-10-20 LAB — CBC WITH DIFFERENTIAL/PLATELET
Absolute Monocytes: 518 cells/uL (ref 200–950)
BASOS PCT: 0.3 %
Basophils Absolute: 21 cells/uL (ref 0–200)
EOS PCT: 2.9 %
Eosinophils Absolute: 206 cells/uL (ref 15–500)
HCT: 31.9 % — ABNORMAL LOW (ref 35.0–45.0)
HEMOGLOBIN: 9.8 g/dL — AB (ref 11.7–15.5)
Lymphs Abs: 2350 cells/uL (ref 850–3900)
MCH: 23 pg — ABNORMAL LOW (ref 27.0–33.0)
MCHC: 30.7 g/dL — ABNORMAL LOW (ref 32.0–36.0)
MCV: 74.9 fL — ABNORMAL LOW (ref 80.0–100.0)
MONOS PCT: 7.3 %
MPV: 10.3 fL (ref 7.5–12.5)
NEUTROS ABS: 4004 {cells}/uL (ref 1500–7800)
Neutrophils Relative %: 56.4 %
Platelets: 348 10*3/uL (ref 140–400)
RBC: 4.26 10*6/uL (ref 3.80–5.10)
RDW: 15.5 % — ABNORMAL HIGH (ref 11.0–15.0)
Total Lymphocyte: 33.1 %
WBC: 7.1 10*3/uL (ref 3.8–10.8)

## 2018-10-20 LAB — HEMOGLOBIN A1C
EAG (MMOL/L): 6.6 (calc)
HEMOGLOBIN A1C: 5.8 %{Hb} — AB (ref <5.7)
MEAN PLASMA GLUCOSE: 120 (calc)

## 2018-10-20 LAB — LIPID PANEL
CHOL/HDL RATIO: 3.6 (calc) (ref <5.0)
CHOLESTEROL: 164 mg/dL (ref <200)
HDL: 46 mg/dL — AB (ref >=50)
LDL Cholesterol (Calc): 96 mg/dL (calc)
NON-HDL CHOLESTEROL (CALC): 118 mg/dL (ref <130)
Triglycerides: 123 mg/dL (ref <150)

## 2018-10-20 LAB — COMPLETE METABOLIC PANEL WITH GFR
AG RATIO: 1.2 (calc) (ref 1.0–2.5)
ALT: 44 U/L — AB (ref 6–29)
AST: 30 U/L (ref 10–35)
Albumin: 4.2 g/dL (ref 3.6–5.1)
Alkaline phosphatase (APISO): 51 U/L (ref 31–125)
BILIRUBIN TOTAL: 0.4 mg/dL (ref 0.2–1.2)
BUN: 10 mg/dL (ref 7–25)
CALCIUM: 9.3 mg/dL (ref 8.6–10.2)
CHLORIDE: 98 mmol/L (ref 98–110)
CO2: 29 mmol/L (ref 20–32)
Creat: 0.76 mg/dL (ref 0.50–1.10)
GFR, EST NON AFRICAN AMERICAN: 94 mL/min/{1.73_m2} (ref >=60)
GFR, Est African American: 109 mL/min/{1.73_m2} (ref >=60)
GLOBULIN: 3.4 g/dL (ref 1.9–3.7)
Glucose, Bld: 93 mg/dL (ref 65–99)
POTASSIUM: 3.3 mmol/L — AB (ref 3.5–5.3)
Sodium: 135 mmol/L (ref 135–146)
Total Protein: 7.6 g/dL (ref 6.1–8.1)

## 2018-10-20 LAB — MAGNESIUM: Magnesium: 1.9 mg/dL (ref 1.5–2.5)

## 2018-10-20 LAB — TSH: TSH: 0.8 mIU/L

## 2018-10-20 NOTE — Telephone Encounter (Signed)
-----   Message from Judd Gaudier, NP sent at 10/20/2018  9:19 AM EDT ----- Regarding: Please send hemoccult Please send the patient a 3 step home hemoccult due to persistent anemia trending down. Please also call to emphasize this needs to be done ASAP, hold iron supplement until completed (and also to read hemoccult card instructions closely prior to completion. Thanks!

## 2018-10-20 NOTE — Telephone Encounter (Signed)
Left message to return my call.  

## 2018-10-22 NOTE — Telephone Encounter (Signed)
Patient notified and will complete Hemoccult ASAP and send back.

## 2018-10-22 NOTE — Telephone Encounter (Signed)
2nd attempt to reach patient. Left VM to return call

## 2018-10-30 ENCOUNTER — Other Ambulatory Visit: Payer: Self-pay

## 2018-10-30 DIAGNOSIS — I10 Essential (primary) hypertension: Secondary | ICD-10-CM

## 2018-10-30 MED ORDER — BENAZEPRIL HCL 40 MG PO TABS: ORAL_TABLET | ORAL | 0 refills | Status: DC

## 2018-10-31 ENCOUNTER — Other Ambulatory Visit: Payer: Self-pay | Admitting: Physician Assistant

## 2018-10-31 DIAGNOSIS — I10 Essential (primary) hypertension: Secondary | ICD-10-CM

## 2018-11-30 ENCOUNTER — Other Ambulatory Visit: Payer: Self-pay | Admitting: Adult Health

## 2018-11-30 DIAGNOSIS — I10 Essential (primary) hypertension: Secondary | ICD-10-CM

## 2018-11-30 DIAGNOSIS — E785 Hyperlipidemia, unspecified: Secondary | ICD-10-CM

## 2018-11-30 MED ORDER — HYDROCHLOROTHIAZIDE 25 MG PO TABS: 25.0000 mg | ORAL_TABLET | Freq: Every day | ORAL | 1 refills | Status: DC

## 2018-11-30 MED ORDER — PRAVASTATIN SODIUM 40 MG PO TABS: ORAL_TABLET | ORAL | 1 refills | Status: DC

## 2019-02-07 ENCOUNTER — Other Ambulatory Visit: Payer: Self-pay | Admitting: Adult Health

## 2019-02-07 ENCOUNTER — Other Ambulatory Visit: Payer: Self-pay | Admitting: Internal Medicine

## 2019-02-07 DIAGNOSIS — I10 Essential (primary) hypertension: Secondary | ICD-10-CM

## 2019-03-16 ENCOUNTER — Other Ambulatory Visit: Payer: Self-pay | Admitting: Adult Health

## 2019-03-16 DIAGNOSIS — I10 Essential (primary) hypertension: Secondary | ICD-10-CM

## 2019-03-16 MED ORDER — METFORMIN HCL ER 500 MG PO TB24: ORAL_TABLET | ORAL | 0 refills | Status: DC

## 2019-03-16 MED ORDER — BENAZEPRIL HCL 40 MG PO TABS: ORAL_TABLET | ORAL | 1 refills | Status: DC

## 2019-03-16 MED ORDER — AMLODIPINE BESYLATE 10 MG PO TABS: ORAL_TABLET | ORAL | 1 refills | Status: DC

## 2019-06-10 ENCOUNTER — Other Ambulatory Visit: Payer: Self-pay | Admitting: Adult Health

## 2019-06-10 DIAGNOSIS — I10 Essential (primary) hypertension: Secondary | ICD-10-CM

## 2019-06-10 DIAGNOSIS — E785 Hyperlipidemia, unspecified: Secondary | ICD-10-CM

## 2019-07-01 ENCOUNTER — Encounter: Payer: Self-pay | Admitting: Adult Health

## 2019-07-21 NOTE — Progress Notes (Signed)
Complete Physical  Assessment and Plan:  Encounter for general adult medical examination with abnormal findings 1 year  Essential hypertension - continue medications, DASH diet, exercise and monitor at home. Call if greater than 130/80.  -     CBC with Differential/Platelet -     CMP/GFR -     TSH -     Urinalysis, Routine w reflex microscopic -     Microalbumin / creatinine urine ratio -     EKG 12-Lead  Morbid obesity (HCC) - BMI 39 with htn, hyperlipidemia, prediabetes - long discussion about weight loss, diet, and exercise -recommended diet heavy in fruits and veggies and low in animal meats, cheeses, and dairy products -     TSH  Mixed hyperlipidemia -continue medications, check lipids, decrease fatty foods, increase activity.  -     Lipid panel  Prediabetes Controlled on metformin  Discussed disease and risks Discussed diet/exercise, weight management  A1C  Medication management -     Magnesium  Menorrhagia with regular cycle Follows with GYN Screen iron deficiency anemia  Iron deficiency anemia, unspecified iron deficiency anemia type Reports history of similar with neg GI workup, was referred to hematology Check CBC, iron, check hemoccult today due to anemia at last check  She was taking iron supplement but has stopped Refer to GI if + hemoccult -     Iron,Total/Total Iron Binding Cap -     CBC -     Hemoccult   Vitamin D deficiency -     VITAMIN D 25 Hydroxy (Vit-D Deficiency, Fractures)  Uncomplicated asthma, unspecified asthma severity, unspecified whether persistent Well controlled, albuterol PRN but rarely uses, avoid triggers  Elevated LFTs Mild, persistent; has never had hepatitis screened; with patient permission will do so today Order [place] if negative and persistent elevations Recommend decrease alcohol, decrease tylenol, and encouraged weight loss.   R sciatica, recurrent - negative back pain, straight leg raise Prednisone was  prescribed,NSAIDs, RICE, and exercise given If not better follow up in office or will refer to PT/orthopedics. Agricultural engineer distributed. Stretching exercises discussed. Follow up if not resolving or with any new symptoms  Orders Placed This Encounter  Procedures  . CBC with Diff  . COMPLETE METABOLIC PANEL WITH GFR  . Magnesium  . Lipid Profile  . TSH  . Hemoglobin A1c ([facility])  . Vitamin D (25 hydroxy)  . Iron,Total/Total Iron Binding Cap  . Microalbumin / Creatinine Urine Ratio  . Urinalysis, Routine w reflex microscopic  . Hepatitis Acute Panel  . POC Hemoccult Bld/Stl (3-Cd Home Screen)  . EKG 12-Lead    Discussed med's effects and SE's. Screening labs and tests as requested with regular follow-up as recommended. May schedule sooner if indicated by lab results.   Future Appointments  Date Time Provider Department Center  01/20/2020  8:45 AM Judd Gaudier, NP [facility] None  07/24/2020  9:00 AM Judd Gaudier, NP [facility] None    HPI  48 y.o. AA female  presents for a complete physical. She has Iron deficiency anemia; Essential hypertension; Mixed hyperlipidemia; Other abnormal glucose (prediabetes) ; Menorrhagia with regular cycle; Vitamin D deficiency; Medication management; Asthma; Morbid obesity (HCC); Elevated LFTs; and Right sided sciatica on their problem list.   She has 4 kids, 5 grandkids. Single and happy. She works as a Location manager.   She reports recurrent sciatica sx of R side; intermittent shooting pain down extremity; denies back pain, weakness. Has tried tylenol with some improvement.   She has  a history of asthma, hasn't had problems recently, may have some difficulty on very hot humid days.   She has had some GERD, back on nexium OTC night PRN, plans to taper soon.   BMI is Body mass index is 39.99 kg/m., she has not been working on diet and exercise. She works physically active job 5 days a week, on her feet with lots of lifting. She  would be willing to work on weight, goal of <190 lb set, discussed start walking 15 min daily.  Wt Readings from Last 3 Encounters:  07/22/19 198 lb (89.8 kg)  10/19/18 201 lb 12.8 oz (91.5 kg)  06/22/18 202 lb (91.6 kg)   Her blood pressure has been controlled at home, today their BP is BP: 130/74.  She does not workout. She denies chest pain, shortness of breath, dizziness. Normal Echo 2014, mild LVH, normal EF 55-60%.  She is on cholesterol medication (pravastatin 40 mg daily) and denies myalgias. Her cholesterol is at goal. The cholesterol last visit was:  Lab Results  Component Value Date   CHOL 164 10/19/2018   HDL 46 (L) 10/19/2018   LDLCALC 96 10/19/2018   TRIG 123 10/19/2018   CHOLHDL 3.6 10/19/2018  . She has been working on diet and exercise for prediabetes which has been controlled with oral metformin 500 mg BID, she is not on bASA, she is on ACE/ARB and denies foot ulcerations, hyperglycemia, hypoglycemia , increased appetite, nausea, paresthesia of the feet, polydipsia, polyuria, visual disturbances, vomiting and weight loss. Last A1C in the office was:  Lab Results  Component Value Date   HGBA1C 5.8 (H) 10/19/2018   Lab Results  Component Value Date   GFRAA 109 10/19/2018   Patient is on Vitamin D supplement, has not been taking it  Lab Results  Component Value Date   VD25OH 14 (L) 06/22/2018      She has iron deficiency anemia, was on iron supplement but stopped, was recommended hemoccult but never completed; she reports has had similar event with iron deficiency in the past, saw GI Dr. Deatra Ina with reportedly normal workup, ended up seeing hematology.  Lab Results  Component Value Date   WBC 7.1 10/19/2018   HGB 9.8 (L) 10/19/2018   HCT 31.9 (L) 10/19/2018   MCV 74.9 (L) 10/19/2018   PLT 348 10/19/2018   Lab Results  Component Value Date   IRON 26 (L) 06/22/2018   TIBC 466 (H) 06/22/2018   FERRITIN 8 (L) 02/10/2018   Lab Results  Component Value Date    RETICCTPCT 3.2 (H) 09/17/2013     Current Medications:  Current Outpatient Medications on File Prior to Visit  Medication Sig Dispense Refill  . albuterol (VENTOLIN HFA) 108 (90 Base) MCG/ACT inhaler Inhale 2 puffs into the lungs every 4 (four) hours as needed for wheezing or shortness of breath. 1 Inhaler 0  . amLODipine (NORVASC) 10 MG tablet Take 1 tablet every Morning for BP 90 tablet 1  . benazepril (LOTENSIN) 40 MG tablet Take 1 tablet every night for BP & Diabetic Kidney Protection 90 tablet 1  . bisoprolol-hydrochlorothiazide (ZIAC) 10-6.25 MG tablet Take 1 tablet every Morning for BP 90 tablet 1  . Esomeprazole Magnesium (NEXIUM PO) Take 1 capsule by mouth daily. Taking OTC at night PRN    . hydrochlorothiazide (HYDRODIURIL) 25 MG tablet Take 1 tablet Daily for BP, Fluid Retention & Ankle Swelling 90 tablet 1  . ibuprofen (ADVIL,MOTRIN) 600 MG tablet Take 1 tablet (600 mg  total) by mouth every 6 (six) hours as needed. 30 tablet 0  . metFORMIN (GLUCOPHAGE-XR) 500 MG 24 hr tablet TAKE 1 TO 2 TABLETS BY MOUTH TWICE A DAY WITH MEALS 360 tablet 0  . pravastatin (PRAVACHOL) 40 MG tablet Take 1 tablet at Bedtime for Cholesterol 90 tablet 1  . ferrous sulfate 325 (65 FE) MG EC tablet Take 325 mg by mouth 2 (two) times daily.     No current facility-administered medications on file prior to visit.    Health Maintenance:   Immunization History  Administered Date(s) Administered  . Influenza Split 06/09/2015  . Influenza, Seasonal, Injecte, Preservative Fre 06/10/2016  . Influenza-Unspecified 05/26/2018, 05/13/2019  . Pneumococcal-Unspecified 08/13/2007  . Td 11/10/2005, 06/10/2016   Tetanus: 2017 Pneumovax: 2009 Flu vaccine: 05/2019 got at work  Pap 07/2018, sees GYN annually and gets there, due this month, pt states will schedule MGM: 09/2017, had diagnostic R mmg x 2 with suspicious area resolved, normal screening 10/2018 Colonoscopy: patient states insurance will cover this  year if needed  Last Eye Exam: Dr. ?, got early 2019, wears glasses, will schedule early 2021 Last [facility]: [facility], last 2019, will schedule   Patient Care Team: Lucky CowboyMcKeown, William, MD as PCP - General (Internal Medicine) Shea EvansMody, Vaishali, MD as Consulting Physician (Obstetrics and Gynecology) Louis MeckelKaplan, Robert D, MD (Inactive) as Consulting Physician (Gastroenterology) Reece PackerLivesay, Lennis P, MD as Consulting Physician (Oncology) Kathleene HazelMcAlhany, Christopher D, MD as Consulting Physician (Cardiology)  Medical History:  Past Medical History:  Diagnosis Date  . Allergic rhinitis   . Asthma   . DM (diabetes mellitus) (HCC)    borderline  . High cholesterol   . Hypertension   . Iron deficiency anemia   . Low blood potassium    Allergies Allergies  Allergen Reactions  . Tussionex Pennkinetic Er [Hydrocod Polst-Cpm Polst Er]     causes headache    SURGICAL HISTORY She  has a past surgical history that includes Cesarean section; Umbilical hernia repair (1977); and Tubal ligation (1996). FAMILY HISTORY Her family history includes Breast cancer in her maternal aunt; Diabetes in her father, maternal aunt, and mother; Heart disease in her maternal grandmother; Mitral valve prolapse in her mother; Pancreatic cancer in her maternal uncle; Stroke in her mother. SOCIAL HISTORY She  reports that she has never smoked. She has never used smokeless tobacco. She reports that she does not drink alcohol or use drugs.  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  HENT: Negative for congestion, ear pain and sore throat.   Eyes: Negative.   Respiratory: Negative for cough, shortness of breath and wheezing.   Cardiovascular: Negative for chest pain, palpitations and leg swelling.  Gastrointestinal: Negative for abdominal pain, blood in stool, constipation,  diarrhea, heartburn and melena.  Genitourinary: Negative.   Musculoskeletal: Negative for back pain, falls and joint pain.       Right intermittent sciatica shooting pain  Skin: Negative.   Neurological: Negative for dizziness, sensory change, loss of consciousness and headaches.  Psychiatric/Behavioral: Negative for depression. The patient is not nervous/anxious and does not have insomnia.     Physical Exam: Estimated body mass index is 39.99 kg/m as calculated from the following:   Height as of this encounter: 4\' 11"  (1.499 m).   Weight as of this encounter: 198 lb (89.8 kg). BP 130/74   Pulse 67  Temp (!) 97.5 F (36.4 C)   Ht 4\' 11"  (1.499 m)   Wt 198 lb (89.8 kg)   SpO2 98%   BMI 39.99 kg/m   General Appearance: Well nourished well developed, obese female, in no apparent distress.  Eyes: PERRLA, EOMs, conjunctiva no swelling or erythema ENT/Mouth: Ear canals normal without obstruction, swelling, erythema, or discharge.  TMs normal bilaterally with no erythema, bulging, retraction, or loss of landmark.  Oropharynx moist and clear with no exudate, erythema, or swelling.   Neck: Supple, thyroid normal. No bruits.  No cervical adenopathy Respiratory: Respiratory effort normal, Breath sounds clear A&P without wheeze, rhonchi, rales.   Cardio: RRR without murmurs, rubs or gallops. Brisk peripheral pulses without edema.  Chest: symmetric, with normal excursions Breasts: defer to GYN Abdomen: Soft, nontender, no guarding, rebound, hernias, masses, or organomegaly.  Lymphatics: Non tender without lymphadenopathy.  Musculoskeletal: Full ROM all peripheral extremities,5/5 strength, and normal gait.  Skin: Warm, dry without rashes, lesions, ecchymosis. Neuro: Awake and oriented X 3, Cranial nerves intact, reflexes equal bilaterally. Normal muscle tone, no cerebellar symptoms. Sensation intact.  Psych:  normal affect, Insight and Judgment appropriate.  GU: defer to GYN  EKG: WNL no  changes.  Over 40 minutes of exam, counseling, chart review and critical decision making was performed  5:54 PM [facility]

## 2019-07-22 ENCOUNTER — Other Ambulatory Visit: Payer: Self-pay

## 2019-07-22 ENCOUNTER — Encounter: Payer: Self-pay | Admitting: Adult Health

## 2019-07-22 ENCOUNTER — Ambulatory Visit: Payer: BLUE CROSS/BLUE SHIELD | Admitting: Adult Health

## 2019-07-22 VITALS — BP 130/74 | HR 67 | Temp 97.5°F | Ht 59.0 in | Wt 198.0 lb

## 2019-07-22 DIAGNOSIS — Z1329 Encounter for screening for other suspected endocrine disorder: Secondary | ICD-10-CM | POA: Diagnosis not present

## 2019-07-22 DIAGNOSIS — Z0001 Encounter for general adult medical examination with abnormal findings: Secondary | ICD-10-CM

## 2019-07-22 DIAGNOSIS — I10 Essential (primary) hypertension: Secondary | ICD-10-CM | POA: Diagnosis not present

## 2019-07-22 DIAGNOSIS — M5432 Sciatica, left side: Secondary | ICD-10-CM | POA: Insufficient documentation

## 2019-07-22 DIAGNOSIS — Z8249 Family history of ischemic heart disease and other diseases of the circulatory system: Secondary | ICD-10-CM | POA: Diagnosis not present

## 2019-07-22 DIAGNOSIS — E559 Vitamin D deficiency, unspecified: Secondary | ICD-10-CM

## 2019-07-22 DIAGNOSIS — Z131 Encounter for screening for diabetes mellitus: Secondary | ICD-10-CM | POA: Diagnosis not present

## 2019-07-22 DIAGNOSIS — Z136 Encounter for screening for cardiovascular disorders: Secondary | ICD-10-CM | POA: Diagnosis not present

## 2019-07-22 DIAGNOSIS — R7989 Other specified abnormal findings of blood chemistry: Secondary | ICD-10-CM

## 2019-07-22 DIAGNOSIS — R7309 Other abnormal glucose: Secondary | ICD-10-CM

## 2019-07-22 DIAGNOSIS — Z1389 Encounter for screening for other disorder: Secondary | ICD-10-CM | POA: Diagnosis not present

## 2019-07-22 DIAGNOSIS — Z0000 Encounter for general adult medical examination without abnormal findings: Secondary | ICD-10-CM

## 2019-07-22 DIAGNOSIS — E782 Mixed hyperlipidemia: Secondary | ICD-10-CM

## 2019-07-22 DIAGNOSIS — Z79899 Other long term (current) drug therapy: Secondary | ICD-10-CM

## 2019-07-22 DIAGNOSIS — D509 Iron deficiency anemia, unspecified: Secondary | ICD-10-CM

## 2019-07-22 DIAGNOSIS — Z1159 Encounter for screening for other viral diseases: Secondary | ICD-10-CM | POA: Diagnosis not present

## 2019-07-22 DIAGNOSIS — Z13220 Encounter for screening for lipoid disorders: Secondary | ICD-10-CM | POA: Diagnosis not present

## 2019-07-22 DIAGNOSIS — M5431 Sciatica, right side: Secondary | ICD-10-CM

## 2019-07-22 DIAGNOSIS — Z130 Encounter for screening for diseases of the blood and blood-forming organs and certain disorders involving the immune mechanism: Secondary | ICD-10-CM | POA: Diagnosis not present

## 2019-07-22 DIAGNOSIS — N920 Excessive and frequent menstruation with regular cycle: Secondary | ICD-10-CM

## 2019-07-22 DIAGNOSIS — J4520 Mild intermittent asthma, uncomplicated: Secondary | ICD-10-CM

## 2019-07-22 MED ORDER — CHOLECALCIFEROL 125 MCG (5000 UT) PO TABS: 1.0000 | ORAL_TABLET | Freq: Every day | ORAL | 1 refills | Status: DC

## 2019-07-22 MED ORDER — PREDNISONE 20 MG PO TABS: ORAL_TABLET | ORAL | 0 refills | Status: DC

## 2019-07-22 NOTE — Patient Instructions (Addendum)
Ms. Alexis Marquez , Thank you for taking time to come for your Annual Wellness Visit. I appreciate your ongoing commitment to your health goals. Please review the following plan we discussed and let me know if I can assist you in the future.   These are the goals we discussed: Goals    . DIET - REDUCE FAST FOOD INTAKE     Try to make better choices when eating out - limit fried foods, choose side salad, fruit, baked over tried, etc.     . Exercise 3x per week (20-30 min per time)    . Weight (lb) < 190 lb (86.2 kg)       This is a list of the screening recommended for you and due dates:  Health Maintenance  Topic Date Due  . Flu Shot  03/13/2019  . Pap Smear  07/12/2020  . Tetanus Vaccine  06/10/2026  . HIV Screening  Discontinued      Sciatica  Sciatica is pain, numbness, weakness, or tingling along the path of the sciatic nerve. The sciatic nerve starts in the lower back and runs down the back of each leg. The nerve controls the muscles in the lower leg and in the back of the knee. It also provides feeling (sensation) to the back of the thigh, the lower leg, and the sole of the foot. Sciatica is a symptom of another medical condition that pinches or puts pressure on the sciatic nerve. Sciatica most often only affects one side of the body. Sciatica usually goes away on its own or with treatment. In some cases, sciatica may come back (recur). What are the causes? This condition is caused by pressure on the sciatic nerve or pinching of the nerve. This may be the result of:  A disk in between the bones of the spine bulging out too far (herniated disk).  Age-related changes in the spinal disks.  A pain disorder that affects a muscle in the buttock.  Extra bone growth near the sciatic nerve.  A break (fracture) of the pelvis.  Pregnancy.  Tumor. This is rare. What increases the risk? The following factors may make you more likely to develop this condition:  Playing sports  that place pressure or stress on the spine.  Having poor strength and flexibility.  A history of back injury or surgery.  Sitting for long periods of time.  Doing activities that involve repetitive bending or lifting.  Obesity. What are the signs or symptoms? Symptoms can vary from mild to very severe, and they may include:  Any of these problems in the lower back, leg, hip, or buttock: ? Mild tingling, numbness, or dull aches. ? Burning sensations. ? Sharp pains.  Numbness in the back of the calf or the sole of the foot.  Leg weakness.  Severe back pain that makes movement difficult. Symptoms may get worse when you cough, sneeze, or laugh, or when you sit or stand for long periods of time. How is this diagnosed? This condition may be diagnosed based on:  Your symptoms and medical history.  A physical exam.  Blood tests.  Imaging tests, such as: ? X-rays. ? MRI. ? CT scan. How is this treated? In many cases, this condition improves on its own without treatment. However, treatment may include:  Reducing or modifying physical activity.  Exercising and stretching.  Icing and applying heat to the affected area.  Medicines that help to: ? Relieve pain and swelling. ? Relax your muscles.  Injections  of medicines that help to relieve pain, irritation, and inflammation around the sciatic nerve (steroids).  Surgery. Follow these instructions at home: Medicines  Take over-the-counter and prescription medicines only as told by your health care provider.  Ask your health care provider if the medicine prescribed to you: ? Requires you to avoid driving or using heavy machinery. ? Can cause constipation. You may need to take these actions to prevent or treat constipation:  Drink enough fluid to keep your urine pale yellow.  Take over-the-counter or prescription medicines.  Eat foods that are high in fiber, such as beans, whole grains, and fresh fruits and  vegetables.  Limit foods that are high in fat and processed sugars, such as fried or sweet foods. Managing pain      If directed, put ice on the affected area. ? Put ice in a plastic bag. ? Place a towel between your skin and the bag. ? Leave the ice on for 20 minutes, 2-3 times a day.  If directed, apply heat to the affected area. Use the heat source that your health care provider recommends, such as a moist heat pack or a heating pad. ? Place a towel between your skin and the heat source. ? Leave the heat on for 20-30 minutes. ? Remove the heat if your skin turns bright red. This is especially important if you are unable to feel pain, heat, or cold. You may have a greater risk of getting burned. Activity   Return to your normal activities as told by your health care provider. Ask your health care provider what activities are safe for you.  Avoid activities that make your symptoms worse.  Take brief periods of rest throughout the day. ? When you rest for longer periods, mix in some mild activity or stretching between periods of rest. This will help to prevent stiffness and pain. ? Avoid sitting for long periods of time without moving. Get up and move around at least one time each hour.  Exercise and stretch regularly, as told by your health care provider.  Do not lift anything that is heavier than 10 lb (4.5 kg) while you have symptoms of sciatica. When you do not have symptoms, you should still avoid heavy lifting, especially repetitive heavy lifting.  When you lift objects, always use proper lifting technique, which includes: ? Bending your knees. ? Keeping the load close to your body. ? Avoiding twisting. General instructions  Maintain a healthy weight. Excess weight puts extra stress on your back.  Wear supportive, comfortable shoes. Avoid wearing high heels.  Avoid sleeping on a mattress that is too soft or too hard. A mattress that is firm enough to support your back  when you sleep may help to reduce your pain.  Keep all follow-up visits as told by your health care provider. This is important. Contact a health care provider if:  You have pain that: ? Wakes you up when you are sleeping. ? Gets worse when you lie down. ? Is worse than you have experienced in the past. ? Lasts longer than 4 weeks.  You have an unexplained weight loss. Get help right away if:  You are not able to control when you urinate or have bowel movements (incontinence).  You have: ? Weakness in your lower back, pelvis, buttocks, or legs that gets worse. ? Redness or swelling of your back. ? A burning sensation when you urinate. Summary  Sciatica is pain, numbness, weakness, or tingling along the path of  the sciatic nerve.  This condition is caused by pressure on the sciatic nerve or pinching of the nerve.  Sciatica can cause pain, numbness, or tingling in the lower back, legs, hips, and buttocks.  Treatment often includes rest, exercise, medicines, and applying ice or heat. This information is not intended to replace advice given to you by your health care provider. Make sure you discuss any questions you have with your health care provider. Document Released: 07/23/2001 Document Revised: 08/17/2018 Document Reviewed: 08/17/2018 [employer] Patient Education  2020 [employer]      Sciatica Rehab Ask your health care provider which exercises are safe for you. Do exercises exactly as told by your health care provider and adjust them as directed. It is normal to feel mild stretching, pulling, tightness, or discomfort as you do these exercises. Stop right away if you feel sudden pain or your pain gets worse. Do not begin these exercises until told by your health care provider. Stretching and range-of-motion exercises These exercises warm up your muscles and joints and improve the movement and flexibility of your hips and back. These exercises also help to relieve pain,  numbness, and tingling. Sciatic nerve glide 1. Sit in a chair with your head facing down toward your chest. Place your hands behind your back. Let your shoulders slump forward. 2. Slowly straighten one of your legs while you tilt your head back as if you are looking toward the ceiling. Only straighten your leg as far as you can without making your symptoms worse. 3. Hold this position for __________ seconds. 4. Slowly return to the starting position. 5. Repeat with your other leg. Repeat __________ times. Complete this exercise __________ times a day. Knee to chest with hip adduction and internal rotation  1. Lie on your back on a firm surface with both legs straight. 2. Bend one of your knees and move it up toward your chest until you feel a gentle stretch in your lower back and buttock. Then, move your knee toward the shoulder that is on the opposite side from your leg. This is hip adduction and internal rotation. ? Hold your leg in this position by holding on to the front of your knee. 3. Hold this position for __________ seconds. 4. Slowly return to the starting position. 5. Repeat with your other leg. Repeat __________ times. Complete this exercise __________ times a day. Prone extension on elbows  1. Lie on your abdomen on a firm surface. A bed may be too soft for this exercise. 2. Prop yourself up on your elbows. 3. Use your arms to help lift your chest up until you feel a gentle stretch in your abdomen and your lower back. ? This will place some of your body weight on your elbows. If this is uncomfortable, try stacking pillows under your chest. ? Your hips should stay down, against the surface that you are lying on. Keep your hip and back muscles relaxed. 4. Hold this position for __________ seconds. 5. Slowly relax your upper body and return to the starting position. Repeat __________ times. Complete this exercise __________ times a day. Strengthening exercises These exercises  build strength and endurance in your back. Endurance is the ability to use your muscles for a long time, even after they get tired. Pelvic tilt This exercise strengthens the muscles that lie deep in the abdomen. 1. Lie on your back on a firm surface. Bend your knees and keep your feet flat on the floor. 2. Tense your abdominal  muscles. Tip your pelvis up toward the ceiling and flatten your lower back into the floor. ? To help with this exercise, you may place a small towel under your lower back and try to push your back into the towel. 3. Hold this position for __________ seconds. 4. Let your muscles relax completely before you repeat this exercise. Repeat __________ times. Complete this exercise __________ times a day. Alternating arm and leg raises  1. Get on your hands and knees on a firm surface. If you are on a hard floor, you may want to use padding, such as an exercise mat, to cushion your knees. 2. Line up your arms and legs. Your hands should be directly below your shoulders, and your knees should be directly below your hips. 3. Lift your left leg behind you. At the same time, raise your right arm and straighten it in front of you. ? Do not lift your leg higher than your hip. ? Do not lift your arm higher than your shoulder. ? Keep your abdominal and back muscles tight. ? Keep your hips facing the ground. ? Do not arch your back. ? Keep your balance carefully, and do not hold your breath. 4. Hold this position for __________ seconds. 5. Slowly return to the starting position. 6. Repeat with your right leg and your left arm. Repeat __________ times. Complete this exercise __________ times a day. Posture and body mechanics Good posture and healthy body mechanics can help to relieve stress in your body's tissues and joints. Body mechanics refers to the movements and positions of your body while you do your daily activities. Posture is part of body mechanics. Good posture means:  Your  spine is in its natural S-curve position (neutral).  Your shoulders are pulled back slightly.  Your head is not tipped forward. Follow these guidelines to improve your posture and body mechanics in your everyday activities. Standing   When standing, keep your spine neutral and your feet about hip width apart. Keep a slight bend in your knees. Your ears, shoulders, and hips should line up.  When you do a task in which you stand in one place for a long time, place one foot up on a stable object that is 2-4 inches (5-10 cm) high, such as a footstool. This helps keep your spine neutral. Sitting   When sitting, keep your spine neutral and keep your feet flat on the floor. Use a footrest, if necessary, and keep your thighs parallel to the floor. Avoid rounding your shoulders, and avoid tilting your head forward.  When working at a desk or a computer, keep your desk at a height where your hands are slightly lower than your elbows. Slide your chair under your desk so you are close enough to maintain good posture.  When working at a computer, place your monitor at a height where you are looking straight ahead and you do not have to tilt your head forward or downward to look at the screen. Resting  When lying down and resting, avoid positions that are most painful for you.  If you have pain with activities such as sitting, bending, stooping, or squatting, lie in a position in which your body does not bend very much. For example, avoid curling up on your side with your arms and knees near your chest (fetal position).  If you have pain with activities such as standing for a long time or reaching with your arms, lie with your spine in a neutral position  and bend your knees slightly. Try the following positions: ? Lying on your side with a pillow between your knees. ? Lying on your back with a pillow under your knees. Lifting   When lifting objects, keep your feet at least shoulder width apart and  tighten your abdominal muscles.  Bend your knees and hips and keep your spine neutral. It is important to lift using the strength of your legs, not your back. Do not lock your knees straight out.  Always ask for help to lift heavy or awkward objects. This information is not intended to replace advice given to you by your health care provider. Make sure you discuss any questions you have with your health care provider. Document Released: 07/29/2005 Document Revised: 11/20/2018 Document Reviewed: 08/20/2018 [employer] Patient Education  2020 [employer].

## 2019-07-23 ENCOUNTER — Other Ambulatory Visit: Payer: Self-pay | Admitting: Adult Health

## 2019-07-23 DIAGNOSIS — D509 Iron deficiency anemia, unspecified: Secondary | ICD-10-CM

## 2019-07-23 DIAGNOSIS — E876 Hypokalemia: Secondary | ICD-10-CM

## 2019-07-23 DIAGNOSIS — D649 Anemia, unspecified: Secondary | ICD-10-CM

## 2019-07-23 LAB — CBC WITH DIFFERENTIAL/PLATELET
Absolute Monocytes: 544 cells/uL (ref 200–950)
Basophils Absolute: 32 cells/uL (ref 0–200)
Basophils Relative: 0.5 %
Eosinophils Absolute: 192 cells/uL (ref 15–500)
Eosinophils Relative: 3 %
HCT: 29.1 % — ABNORMAL LOW (ref 35.0–45.0)
Hemoglobin: 8.7 g/dL — ABNORMAL LOW (ref 11.7–15.5)
Lymphs Abs: 2598 cells/uL (ref 850–3900)
MCH: 21.4 pg — ABNORMAL LOW (ref 27.0–33.0)
MCHC: 29.9 g/dL — ABNORMAL LOW (ref 32.0–36.0)
MCV: 71.5 fL — ABNORMAL LOW (ref 80.0–100.0)
MPV: 9.3 fL (ref 7.5–12.5)
Monocytes Relative: 8.5 %
Neutro Abs: 3034 cells/uL (ref 1500–7800)
Neutrophils Relative %: 47.4 %
Platelets: 382 10*3/uL (ref 140–400)
RBC: 4.07 10*6/uL (ref 3.80–5.10)
RDW: 16.2 % — ABNORMAL HIGH (ref 11.0–15.0)
Total Lymphocyte: 40.6 %
WBC: 6.4 10*3/uL (ref 3.8–10.8)

## 2019-07-23 LAB — COMPLETE METABOLIC PANEL WITH GFR
AG Ratio: 1.2 (calc) (ref 1.0–2.5)
ALT: 42 U/L — ABNORMAL HIGH (ref 6–29)
AST: 28 U/L (ref 10–35)
Albumin: 4.2 g/dL (ref 3.6–5.1)
Alkaline phosphatase (APISO): 54 U/L (ref 31–125)
BUN: 9 mg/dL (ref 7–25)
CO2: 29 mmol/L (ref 20–32)
Calcium: 9.5 mg/dL (ref 8.6–10.2)
Chloride: 100 mmol/L (ref 98–110)
Creat: 0.75 mg/dL (ref 0.50–1.10)
GFR, Est African American: 110 mL/min/{1.73_m2} (ref >=60)
GFR, Est Non African American: 95 mL/min/{1.73_m2} (ref >=60)
Globulin: 3.5 g/dL (calc) (ref 1.9–3.7)
Glucose, Bld: 92 mg/dL (ref 65–99)
Potassium: 3.3 mmol/L — ABNORMAL LOW (ref 3.5–5.3)
Sodium: 137 mmol/L (ref 135–146)
Total Bilirubin: 0.3 mg/dL (ref 0.2–1.2)
Total Protein: 7.7 g/dL (ref 6.1–8.1)

## 2019-07-23 LAB — HEPATITIS PANEL, ACUTE
Hep A IgM: NONREACTIVE
Hep B C IgM: NONREACTIVE
Hepatitis B Surface Ag: NONREACTIVE
Hepatitis C Ab: NONREACTIVE
SIGNAL TO CUT-OFF: 0.01 (ref <1.00)

## 2019-07-23 LAB — MICROALBUMIN / CREATININE URINE RATIO
Creatinine, Urine: 146 mg/dL (ref 20–275)
Microalb Creat Ratio: 6 mcg/mg creat (ref <30)
Microalb, Ur: 0.9 mg/dL

## 2019-07-23 LAB — MAGNESIUM: Magnesium: 2 mg/dL (ref 1.5–2.5)

## 2019-07-23 LAB — VITAMIN D 25 HYDROXY (VIT D DEFICIENCY, FRACTURES): Vit D, 25-Hydroxy: 12 ng/mL — ABNORMAL LOW (ref 30–100)

## 2019-07-23 LAB — LIPID PANEL
Cholesterol: 168 mg/dL (ref <200)
HDL: 53 mg/dL (ref >=50)
LDL Cholesterol (Calc): 96 mg/dL (calc)
Non-HDL Cholesterol (Calc): 115 mg/dL (calc) (ref <130)
Total CHOL/HDL Ratio: 3.2 (calc) (ref <5.0)
Triglycerides: 99 mg/dL (ref <150)

## 2019-07-23 LAB — URINALYSIS, ROUTINE W REFLEX MICROSCOPIC
Bilirubin Urine: NEGATIVE
Glucose, UA: NEGATIVE
Hgb urine dipstick: NEGATIVE
Ketones, ur: NEGATIVE
Leukocytes,Ua: NEGATIVE
Nitrite: NEGATIVE
Protein, ur: NEGATIVE
Specific Gravity, Urine: 1.021 (ref 1.001–1.03)
pH: 6.5 (ref 5.0–8.0)

## 2019-07-23 LAB — HEMOGLOBIN A1C
Hgb A1c MFr Bld: 5.8 % of total Hgb — ABNORMAL HIGH (ref <5.7)
Mean Plasma Glucose: 120 (calc)
eAG (mmol/L): 6.6 (calc)

## 2019-07-23 LAB — IRON, TOTAL/TOTAL IRON BINDING CAP
%SAT: 4 % (calc) — ABNORMAL LOW (ref 16–45)
Iron: 21 ug/dL — ABNORMAL LOW (ref 40–190)
TIBC: 491 mcg/dL (calc) — ABNORMAL HIGH (ref 250–450)

## 2019-07-23 LAB — TSH: TSH: 1 mIU/L

## 2019-07-23 MED ORDER — POTASSIUM CHLORIDE ER 10 MEQ PO TBCR: 10.0000 meq | EXTENDED_RELEASE_TABLET | Freq: Every day | ORAL | 0 refills | Status: DC

## 2019-08-09 ENCOUNTER — Other Ambulatory Visit: Payer: BC Managed Care – PPO

## 2019-08-09 ENCOUNTER — Other Ambulatory Visit: Payer: Self-pay

## 2019-08-09 DIAGNOSIS — D509 Iron deficiency anemia, unspecified: Secondary | ICD-10-CM

## 2019-08-09 DIAGNOSIS — D649 Anemia, unspecified: Secondary | ICD-10-CM

## 2019-08-09 DIAGNOSIS — E876 Hypokalemia: Secondary | ICD-10-CM

## 2019-08-09 DIAGNOSIS — R7989 Other specified abnormal findings of blood chemistry: Secondary | ICD-10-CM | POA: Diagnosis not present

## 2019-08-10 ENCOUNTER — Other Ambulatory Visit: Payer: Self-pay | Admitting: Adult Health

## 2019-08-10 ENCOUNTER — Other Ambulatory Visit: Payer: Self-pay

## 2019-08-10 DIAGNOSIS — D509 Iron deficiency anemia, unspecified: Secondary | ICD-10-CM

## 2019-08-10 DIAGNOSIS — Z1211 Encounter for screening for malignant neoplasm of colon: Secondary | ICD-10-CM

## 2019-08-10 DIAGNOSIS — Z1212 Encounter for screening for malignant neoplasm of rectum: Secondary | ICD-10-CM

## 2019-08-10 LAB — POC HEMOCCULT BLD/STL (HOME/3-CARD/SCREEN)
Card #2 Fecal Occult Blod, POC: NEGATIVE
Card #3 Fecal Occult Blood, POC: NEGATIVE
Fecal Occult Blood, POC: NEGATIVE

## 2019-08-10 LAB — CBC WITH DIFFERENTIAL/PLATELET
Absolute Monocytes: 639 cells/uL (ref 200–950)
Basophils Absolute: 43 cells/uL (ref 0–200)
Basophils Relative: 0.6 %
Eosinophils Absolute: 170 cells/uL (ref 15–500)
Eosinophils Relative: 2.4 %
HCT: 29.6 % — ABNORMAL LOW (ref 35.0–45.0)
Hemoglobin: 8.9 g/dL — ABNORMAL LOW (ref 11.7–15.5)
Lymphs Abs: 2237 cells/uL (ref 850–3900)
MCH: 21.5 pg — ABNORMAL LOW (ref 27.0–33.0)
MCHC: 30.1 g/dL — ABNORMAL LOW (ref 32.0–36.0)
MCV: 71.5 fL — ABNORMAL LOW (ref 80.0–100.0)
MPV: 9.8 fL (ref 7.5–12.5)
Monocytes Relative: 9 %
Neutro Abs: 4012 cells/uL (ref 1500–7800)
Neutrophils Relative %: 56.5 %
Platelets: 434 10*3/uL — ABNORMAL HIGH (ref 140–400)
RBC: 4.14 10*6/uL (ref 3.80–5.10)
RDW: 16.6 % — ABNORMAL HIGH (ref 11.0–15.0)
Total Lymphocyte: 31.5 %
WBC: 7.1 10*3/uL (ref 3.8–10.8)

## 2019-08-10 LAB — FERRITIN: Ferritin: 5 ng/mL — ABNORMAL LOW (ref 16–232)

## 2019-08-10 LAB — BASIC METABOLIC PANEL WITH GFR
BUN/Creatinine Ratio: 10 (calc) (ref 6–22)
BUN: 6 mg/dL — ABNORMAL LOW (ref 7–25)
CO2: 27 mmol/L (ref 20–32)
Calcium: 9.5 mg/dL (ref 8.6–10.2)
Chloride: 101 mmol/L (ref 98–110)
Creat: 0.63 mg/dL (ref 0.50–1.10)
GFR, Est African American: 124 mL/min/{1.73_m2} (ref >=60)
GFR, Est Non African American: 107 mL/min/{1.73_m2} (ref >=60)
Glucose, Bld: 96 mg/dL (ref 65–99)
Potassium: 3.6 mmol/L (ref 3.5–5.3)
Sodium: 139 mmol/L (ref 135–146)

## 2019-08-10 LAB — RETICULOCYTES
ABS Retic: 128340 cells/uL — ABNORMAL HIGH (ref 20000–8000)
Retic Ct Pct: 3.1 %

## 2019-08-10 MED ORDER — FERROUS FUMARATE 324 (106 FE) MG PO TABS: 1.0000 | ORAL_TABLET | Freq: Two times a day (BID) | ORAL | 2 refills | Status: DC

## 2019-09-08 ENCOUNTER — Other Ambulatory Visit: Payer: Self-pay

## 2019-09-08 ENCOUNTER — Encounter: Payer: Self-pay | Admitting: Adult Health

## 2019-09-08 ENCOUNTER — Ambulatory Visit (INDEPENDENT_AMBULATORY_CARE_PROVIDER_SITE_OTHER): Payer: BC Managed Care – PPO | Admitting: Adult Health

## 2019-09-08 VITALS — BP 126/82 | HR 65 | Temp 98.1°F | Wt 202.0 lb

## 2019-09-08 DIAGNOSIS — M5432 Sciatica, left side: Secondary | ICD-10-CM

## 2019-09-08 DIAGNOSIS — D509 Iron deficiency anemia, unspecified: Secondary | ICD-10-CM

## 2019-09-08 DIAGNOSIS — R7989 Other specified abnormal findings of blood chemistry: Secondary | ICD-10-CM

## 2019-09-08 MED ORDER — CYCLOBENZAPRINE HCL 5 MG PO TABS: 5.0000 mg | ORAL_TABLET | Freq: Three times a day (TID) | ORAL | 0 refills | Status: DC | PRN

## 2019-09-08 MED ORDER — MELOXICAM 15 MG PO TABS: ORAL_TABLET | ORAL | 1 refills | Status: DC

## 2019-09-08 NOTE — Patient Instructions (Signed)
Sciatica  Sciatica is pain, numbness, weakness, or tingling along the path of the sciatic nerve. The sciatic nerve starts in the lower back and runs down the back of each leg. The nerve controls the muscles in the lower leg and in the back of the knee. It also provides feeling (sensation) to the back of the thigh, the lower leg, and the sole of the foot. Sciatica is a symptom of another medical condition that pinches or puts pressure on the sciatic nerve. Sciatica most often only affects one side of the body. Sciatica usually goes away on its own or with treatment. In some cases, sciatica may come back (recur). What are the causes? This condition is caused by pressure on the sciatic nerve or pinching of the nerve. This may be the result of:  A disk in between the bones of the spine bulging out too far (herniated disk).  Age-related changes in the spinal disks.  A pain disorder that affects a muscle in the buttock.  Extra bone growth near the sciatic nerve.  A break (fracture) of the pelvis.  Pregnancy.  Tumor. This is rare. What increases the risk? The following factors may make you more likely to develop this condition:  Playing sports that place pressure or stress on the spine.  Having poor strength and flexibility.  A history of back injury or surgery.  Sitting for long periods of time.  Doing activities that involve repetitive bending or lifting.  Obesity. What are the signs or symptoms? Symptoms can vary from mild to very severe, and they may include:  Any of these problems in the lower back, leg, hip, or buttock: ? Mild tingling, numbness, or dull aches. ? Burning sensations. ? Sharp pains.  Numbness in the back of the calf or the sole of the foot.  Leg weakness.  Severe back pain that makes movement difficult. Symptoms may get worse when you cough, sneeze, or laugh, or when you sit or stand for long periods of time. How is this diagnosed? This condition  may be diagnosed based on:  Your symptoms and medical history.  A physical exam.  Blood tests.  Imaging tests, such as: ? X-rays. ? MRI. ? CT scan. How is this treated? In many cases, this condition improves on its own without treatment. However, treatment may include:  Reducing or modifying physical activity.  Exercising and stretching.  Icing and applying heat to the affected area.  Medicines that help to: ? Relieve pain and swelling. ? Relax your muscles.  Injections of medicines that help to relieve pain, irritation, and inflammation around the sciatic nerve (steroids).  Surgery. Follow these instructions at home: Medicines  Take over-the-counter and prescription medicines only as told by your health care provider.  Ask your health care provider if the medicine prescribed to you: ? Requires you to avoid driving or using heavy machinery. ? Can cause constipation. You may need to take these actions to prevent or treat constipation:  Drink enough fluid to keep your urine pale yellow.  Take over-the-counter or prescription medicines.  Eat foods that are high in fiber, such as beans, whole grains, and fresh fruits and vegetables.  Limit foods that are high in fat and processed sugars, such as fried or sweet foods. Managing pain      If directed, put ice on the affected area. ? Put ice in a plastic bag. ? Place a towel between your skin and the bag. ? Leave the ice on for  20 minutes, 2-3 times a day.  If directed, apply heat to the affected area. Use the heat source that your health care provider recommends, such as a moist heat pack or a heating pad. ? Place a towel between your skin and the heat source. ? Leave the heat on for 20-30 minutes. ? Remove the heat if your skin turns bright red. This is especially important if you are unable to feel pain, heat, or cold. You may have a greater risk of getting burned. Activity   Return to your normal activities as  told by your health care provider. Ask your health care provider what activities are safe for you.  Avoid activities that make your symptoms worse.  Take brief periods of rest throughout the day. ? When you rest for longer periods, mix in some mild activity or stretching between periods of rest. This will help to prevent stiffness and pain. ? Avoid sitting for long periods of time without moving. Get up and move around at least one time each hour.  Exercise and stretch regularly, as told by your health care provider.  Do not lift anything that is heavier than 10 lb (4.5 kg) while you have symptoms of sciatica. When you do not have symptoms, you should still avoid heavy lifting, especially repetitive heavy lifting.  When you lift objects, always use proper lifting technique, which includes: ? Bending your knees. ? Keeping the load close to your body. ? Avoiding twisting. General instructions  Maintain a healthy weight. Excess weight puts extra stress on your back.  Wear supportive, comfortable shoes. Avoid wearing high heels.  Avoid sleeping on a mattress that is too soft or too hard. A mattress that is firm enough to support your back when you sleep may help to reduce your pain.  Keep all follow-up visits as told by your health care provider. This is important. Contact a health care provider if:  You have pain that: ? Wakes you up when you are sleeping. ? Gets worse when you lie down. ? Is worse than you have experienced in the past. ? Lasts longer than 4 weeks.  You have an unexplained weight loss. Get help right away if:  You are not able to control when you urinate or have bowel movements (incontinence).  You have: ? Weakness in your lower back, pelvis, buttocks, or legs that gets worse. ? Redness or swelling of your back. ? A burning sensation when you urinate. Summary  Sciatica is pain, numbness, weakness, or tingling along the path of the sciatic nerve.  This  condition is caused by pressure on the sciatic nerve or pinching of the nerve.  Sciatica can cause pain, numbness, or tingling in the lower back, legs, hips, and buttocks.  Treatment often includes rest, exercise, medicines, and applying ice or heat. This information is not intended to replace advice given to you by your health care provider. Make sure you discuss any questions you have with your health care provider. Document Revised: 08/17/2018 Document Reviewed: 08/17/2018 Elsevier Patient Education  2020 [employer]      Sciatica Rehab Ask your health care provider which exercises are safe for you. Do exercises exactly as told by your health care provider and adjust them as directed. It is normal to feel mild stretching, pulling, tightness, or discomfort as you do these exercises. Stop right away if you feel sudden pain or your pain gets worse. Do not begin these exercises until told by your health care provider. Stretching  and range-of-motion exercises These exercises warm up your muscles and joints and improve the movement and flexibility of your hips and back. These exercises also help to relieve pain, numbness, and tingling. Sciatic nerve glide 1. Sit in a chair with your head facing down toward your chest. Place your hands behind your back. Let your shoulders slump forward. 2. Slowly straighten one of your legs while you tilt your head back as if you are looking toward the ceiling. Only straighten your leg as far as you can without making your symptoms worse. 3. Hold this position for __________ seconds. 4. Slowly return to the starting position. 5. Repeat with your other leg. Repeat __________ times. Complete this exercise __________ times a day. Knee to chest with hip adduction and internal rotation  1. Lie on your back on a firm surface with both legs straight. 2. Bend one of your knees and move it up toward your chest until you feel a gentle stretch in your lower back and  buttock. Then, move your knee toward the shoulder that is on the opposite side from your leg. This is hip adduction and internal rotation. ? Hold your leg in this position by holding on to the front of your knee. 3. Hold this position for __________ seconds. 4. Slowly return to the starting position. 5. Repeat with your other leg. Repeat __________ times. Complete this exercise __________ times a day. Prone extension on elbows  1. Lie on your abdomen on a firm surface. A bed may be too soft for this exercise. 2. Prop yourself up on your elbows. 3. Use your arms to help lift your chest up until you feel a gentle stretch in your abdomen and your lower back. ? This will place some of your body weight on your elbows. If this is uncomfortable, try stacking pillows under your chest. ? Your hips should stay down, against the surface that you are lying on. Keep your hip and back muscles relaxed. 4. Hold this position for __________ seconds. 5. Slowly relax your upper body and return to the starting position. Repeat __________ times. Complete this exercise __________ times a day. Strengthening exercises These exercises build strength and endurance in your back. Endurance is the ability to use your muscles for a long time, even after they get tired. Pelvic tilt This exercise strengthens the muscles that lie deep in the abdomen. 1. Lie on your back on a firm surface. Bend your knees and keep your feet flat on the floor. 2. Tense your abdominal muscles. Tip your pelvis up toward the ceiling and flatten your lower back into the floor. ? To help with this exercise, you may place a small towel under your lower back and try to push your back into the towel. 3. Hold this position for __________ seconds. 4. Let your muscles relax completely before you repeat this exercise. Repeat __________ times. Complete this exercise __________ times a day. Alternating arm and leg raises  1. Get on your hands and knees  on a firm surface. If you are on a hard floor, you may want to use padding, such as an exercise mat, to cushion your knees. 2. Line up your arms and legs. Your hands should be directly below your shoulders, and your knees should be directly below your hips. 3. Lift your left leg behind you. At the same time, raise your right arm and straighten it in front of you. ? Do not lift your leg higher than your hip. ? Do not  lift your arm higher than your shoulder. ? Keep your abdominal and back muscles tight. ? Keep your hips facing the ground. ? Do not arch your back. ? Keep your balance carefully, and do not hold your breath. 4. Hold this position for __________ seconds. 5. Slowly return to the starting position. 6. Repeat with your right leg and your left arm. Repeat __________ times. Complete this exercise __________ times a day. Posture and body mechanics Good posture and healthy body mechanics can help to relieve stress in your body's tissues and joints. Body mechanics refers to the movements and positions of your body while you do your daily activities. Posture is part of body mechanics. Good posture means:  Your spine is in its natural S-curve position (neutral).  Your shoulders are pulled back slightly.  Your head is not tipped forward. Follow these guidelines to improve your posture and body mechanics in your everyday activities. Standing   When standing, keep your spine neutral and your feet about hip width apart. Keep a slight bend in your knees. Your ears, shoulders, and hips should line up.  When you do a task in which you stand in one place for a long time, place one foot up on a stable object that is 2-4 inches (5-10 cm) high, such as a footstool. This helps keep your spine neutral. Sitting   When sitting, keep your spine neutral and keep your feet flat on the floor. Use a footrest, if necessary, and keep your thighs parallel to the floor. Avoid rounding your shoulders, and  avoid tilting your head forward.  When working at a desk or a computer, keep your desk at a height where your hands are slightly lower than your elbows. Slide your chair under your desk so you are close enough to maintain good posture.  When working at a computer, place your monitor at a height where you are looking straight ahead and you do not have to tilt your head forward or downward to look at the screen. Resting  When lying down and resting, avoid positions that are most painful for you.  If you have pain with activities such as sitting, bending, stooping, or squatting, lie in a position in which your body does not bend very much. For example, avoid curling up on your side with your arms and knees near your chest (fetal position).  If you have pain with activities such as standing for a long time or reaching with your arms, lie with your spine in a neutral position and bend your knees slightly. Try the following positions: ? Lying on your side with a pillow between your knees. ? Lying on your back with a pillow under your knees. Lifting   When lifting objects, keep your feet at least shoulder width apart and tighten your abdominal muscles.  Bend your knees and hips and keep your spine neutral. It is important to lift using the strength of your legs, not your back. Do not lock your knees straight out.  Always ask for help to lift heavy or awkward objects. This information is not intended to replace advice given to you by your health care provider. Make sure you discuss any questions you have with your health care provider. Document Revised: 11/20/2018 Document Reviewed: 08/20/2018 Elsevier Patient Education  2020 [employer]     Meloxicam tablets What is this medicine? MELOXICAM (mel OX i cam) is a non-steroidal anti-inflammatory drug (NSAID). It is used to reduce swelling and to treat pain. It  may be used for osteoarthritis, rheumatoid arthritis, or juvenile rheumatoid  arthritis. This medicine may be used for other purposes; ask your health care provider or pharmacist if you have questions. COMMON BRAND NAME(S): Mobic What should I tell my health care provider before I take this medicine? They need to know if you have any of these conditions:  bleeding disorders  cigarette smoker  coronary artery bypass graft (CABG) surgery within the past 2 weeks  drink more than 3 alcohol-containing drinks per day  heart disease  high blood pressure  history of stomach bleeding  kidney disease  liver disease  lung or breathing disease, like asthma  stomach or intestine problems  an unusual or allergic reaction to meloxicam, aspirin, other NSAIDs, other medicines, foods, dyes, or preservatives  pregnant or trying to get pregnant  breast-feeding How should I use this medicine? Take this medicine by mouth with a full glass of water. Follow the directions on the prescription label. You can take it with or without food. If it upsets your stomach, take it with food. Take your medicine at regular intervals. Do not take it more often than directed. Do not stop taking except on your doctor's advice. A special MedGuide will be given to you by the pharmacist with each prescription and refill. Be sure to read this information carefully each time. Talk to your pediatrician regarding the use of this medicine in children. While this drug may be prescribed for selected conditions, precautions do apply. Patients over 82 years old may have a stronger reaction and need a smaller dose. Overdosage: If you think you have taken too much of this medicine contact a poison control center or emergency room at once. NOTE: This medicine is only for you. Do not share this medicine with others. What if I miss a dose? If you miss a dose, take it as soon as you can. If it is almost time for your next dose, take only that dose. Do not take double or extra doses. What may interact with  this medicine? Do not take this medicine with any of the following medications:  cidofovir  ketorolac This medicine may also interact with the following medications:  aspirin and aspirin-like medicines  certain medicines for blood pressure, heart disease, irregular heart beat  certain medicines for depression, anxiety, or psychotic disturbances  certain medicines that treat or prevent blood clots like warfarin, enoxaparin, dalteparin, apixaban, dabigatran, rivaroxaban  cyclosporine  diuretics  fluconazole  lithium  methotrexate  other NSAIDs, medicines for pain and inflammation, like ibuprofen and naproxen  pemetrexed This list may not describe all possible interactions. Give your health care provider a list of all the medicines, herbs, non-prescription drugs, or dietary supplements you use. Also tell them if you smoke, drink alcohol, or use illegal drugs. Some items may interact with your medicine. What should I watch for while using this medicine? Tell your doctor or healthcare provider if your symptoms do not start to get better or if they get worse. This medicine may cause serious skin reactions. They can happen weeks to months after starting the medicine. Contact your healthcare provider right away if you notice fevers or flu-like symptoms with a rash. The rash may be red or purple and then turn into blisters or peeling of the skin. Or, you might notice a red rash with swelling of the face, lips or lymph nodes in your neck or under your arms. Do not take other medicines that contain aspirin, ibuprofen, or naproxen with  this medicine. Side effects such as stomach upset, nausea, or ulcers may be more likely to occur. Many medicines available without a prescription should not be taken with this medicine. This medicine can cause ulcers and bleeding in the stomach and intestines at any time during treatment. This can happen with no warning and may cause death. There is increased  risk with taking this medicine for a long time. Smoking, drinking alcohol, older age, and poor health can also increase risks. Call your doctor right away if you have stomach pain or blood in your vomit or stool. This medicine does not prevent heart attack or stroke. In fact, this medicine may increase the chance of a heart attack or stroke. The chance may increase with longer use of this medicine and in people who have heart disease. If you take aspirin to prevent heart attack or stroke, talk with your doctor or healthcare provider. What side effects may I notice from receiving this medicine? Side effects that you should report to your doctor or health care professional as soon as possible:  allergic reactions like skin rash, itching or hives, swelling of the face, lips, or tongue  nausea, vomiting  redness, blistering, peeling, or loosening of the skin, including inside the mouth  signs and symptoms of a blood clot such as breathing problems; changes in vision; chest pain; severe, sudden headache; pain, swelling, warmth in the leg; trouble speaking; sudden numbness or weakness of the face, arm, or leg  signs and symptoms of bleeding such as bloody or black, tarry stools; red or dark-brown urine; spitting up blood or brown material that looks like coffee grounds; red spots on the skin; unusual bruising or bleeding from the eye, gums, or nose  signs and symptoms of liver injury like dark yellow or brown urine; general ill feeling or flu-like symptoms; light-colored stools; loss of appetite; nausea; right upper belly pain; unusually weak or tired; yellowing of the eyes or skin  signs and symptoms of stroke like changes in vision; confusion; trouble speaking or understanding; severe headaches; sudden numbness or weakness of the face, arm, or leg; trouble walking; dizziness; loss of balance or coordination Side effects that usually do not require medical attention (report to your doctor or health care  professional if they continue or are bothersome):  constipation  diarrhea  gas This list may not describe all possible side effects. Call your doctor for medical advice about side effects. You may report side effects to FDA at 1-800-FDA-1088. Where should I keep my medicine? Keep out of the reach of children. Store at room temperature between 15 and 30 degrees C (59 and 86 degrees F). Throw away any unused medicine after the expiration date. NOTE: This sheet is a summary. It may not cover all possible information. If you have questions about this medicine, talk to your doctor, pharmacist, or health care provider.  2020 Elsevier/Gold Standard (2018-10-28 11:21:28)      Cyclobenzaprine tablets What is this medicine? CYCLOBENZAPRINE (sye kloe BEN za preen) is a muscle relaxer. It is used to treat muscle pain, spasms, and stiffness. This medicine may be used for other purposes; ask your health care provider or pharmacist if you have questions. COMMON BRAND NAME(S): Fexmid, Flexeril What should I tell my health care provider before I take this medicine? They need to know if you have any of these conditions:  heart disease, irregular heartbeat, or previous heart attack  liver disease  thyroid problem  an unusual or allergic reaction to  cyclobenzaprine, tricyclic antidepressants, lactose, other medicines, foods, dyes, or preservatives  pregnant or trying to get pregnant  breast-feeding How should I use this medicine? Take this medicine by mouth with a glass of water. Follow the directions on the prescription label. If this medicine upsets your stomach, take it with food or milk. Take your medicine at regular intervals. Do not take it more often than directed. Talk to your pediatrician regarding the use of this medicine in children. Special care may be needed. Overdosage: If you think you have taken too much of this medicine contact a poison control center or emergency room at  once. NOTE: This medicine is only for you. Do not share this medicine with others. What if I miss a dose? If you miss a dose, take it as soon as you can. If it is almost time for your next dose, take only that dose. Do not take double or extra doses. What may interact with this medicine? Do not take this medicine with any of the following medications:  MAOIs like Carbex, Eldepryl, Marplan, Nardil, and Parnate  narcotic medicines for cough  safinamide This medicine may also interact with the following medications:  alcohol  bupropion  antihistamines for allergy, cough and cold  certain medicines for anxiety or sleep  certain medicines for bladder problems like oxybutynin, tolterodine  certain medicines for depression like amitriptyline, fluoxetine, sertraline  certain medicines for Parkinson's disease like benztropine, trihexyphenidyl  certain medicines for seizures like phenobarbital, primidone  certain medicines for stomach problems like dicyclomine, hyoscyamine  certain medicines for travel sickness like scopolamine  general anesthetics like halothane, isoflurane, methoxyflurane, propofol  ipratropium  local anesthetics like lidocaine, pramoxine, tetracaine  medicines that relax muscles for surgery  narcotic medicines for pain  phenothiazines like chlorpromazine, mesoridazine, prochlorperazine, thioridazine  verapamil This list may not describe all possible interactions. Give your health care provider a list of all the medicines, herbs, non-prescription drugs, or dietary supplements you use. Also tell them if you smoke, drink alcohol, or use illegal drugs. Some items may interact with your medicine. What should I watch for while using this medicine? Tell your doctor or health care professional if your symptoms do not start to get better or if they get worse. You may get drowsy or dizzy. Do not drive, use machinery, or do anything that needs mental alertness until  you know how this medicine affects you. Do not stand or sit up quickly, especially if you are an older patient. This reduces the risk of dizzy or fainting spells. Alcohol may interfere with the effect of this medicine. Avoid alcoholic drinks. If you are taking another medicine that also causes drowsiness, you may have more side effects. Give your health care provider a list of all medicines you use. Your doctor will tell you how much medicine to take. Do not take more medicine than directed. Call emergency for help if you have problems breathing or unusual sleepiness. Your mouth may get dry. Chewing sugarless gum or sucking hard candy, and drinking plenty of water may help. Contact your doctor if the problem does not go away or is severe. What side effects may I notice from receiving this medicine? Side effects that you should report to your doctor or health care professional as soon as possible:  allergic reactions like skin rash, itching or hives, swelling of the face, lips, or tongue  breathing problems  chest pain  fast, irregular heartbeat  hallucinations  seizures  unusually weak or tired Side  effects that usually do not require medical attention (report to your doctor or health care professional if they continue or are bothersome):  headache  nausea, vomiting This list may not describe all possible side effects. Call your doctor for medical advice about side effects. You may report side effects to FDA at 1-800-FDA-1088. Where should I keep my medicine? Keep out of the reach of children. Store at room temperature between 15 and 30 degrees C (59 and 86 degrees F). Keep container tightly closed. Throw away any unused medicine after the expiration date. NOTE: This sheet is a summary. It may not cover all possible information. If you have questions about this medicine, talk to your doctor, pharmacist, or health care provider.  2020 Elsevier/Gold Standard (2018-07-01 12:49:26)

## 2019-09-08 NOTE — Progress Notes (Signed)
Assessment and Plan:  Alexis Marquez was seen today for injections.  Diagnoses and all orders for this visit:  Left sided sciatica - negative straight leg Prednisone was not prescribed,NSAIDs, RICE, and exercise given Natural history and expected course discussed. Questions answered. Neurosurgeon distributed. Proper lifting, bending technique discussed. Stretching exercises discussed. Heat to affected area as needed for local pain relief. NSAIDs per medication orders. Muscle relaxants per medication orders.  Declines imaging at this time but may consider lumbar xray if not improving If not better follow up in office or will refer to PT/orthopedics. -     meloxicam (MOBIC) 15 MG tablet; Take one daily with food for 2 weeks, can take with tylenol, can not take with aleve, iburpofen, then as needed daily for pain -     cyclobenzaprine (FLEXERIL) 5 MG tablet; Take 1 tablet (5 mg total) by mouth 3 (three) times daily as needed for muscle spasms. May cause drowsiness, do not take if driving or with alcohol.  Iron deficiency anemia, unspecified iron deficiency anemia type Continue iron supplement BID;  -     CBC with Differential/Platelet  Elevated LFTs Check labs, avoid tylenol, alcohol, weight loss advised.  -     COMPLETE METABOLIC PANEL WITH GFR   Further disposition pending results of labs. Discussed med's effects and SE's.   Over 15 minutes of exam, counseling, chart review, and critical decision making was performed.   Future Appointments  Date Time Provider [place]  09/21/2019  3:30 PM Liane Comber, NP [facility] None  01/20/2020  8:45 AM Liane Comber, NP [facility] None  07/24/2020  9:00 AM Liane Comber, NP [facility] None    ------------------------------------------------------------------------------------------------------------------   HPI BP 126/82   Pulse 65   Temp 98.1 F (36.7 C)   Wt 202 lb (91.6 kg)   SpO2 97%   BMI 40.80 kg/m   49  y.o.female with hx of morbid obesity and R sided sciatica and presents for a "cortisone shot."   She reports 2-3 months history of L sided radiating/sooting pain down left lateral leg; she believes this started after a day at work (works in [employer], educational, believes she bumped her hip and symptoms began shortly after. Endorses lateral hip pain; denies lumbar/back pain, weakness.   She reports symptoms are intermittent, worse after she bends over and straightens up. Does have if stands for long periods as well. She denies pain at rest, intermittent severe shooting pain up to 8/10, consistent, not significantly improved or worse since onset. She tried a prednisone taper with minimal improvement.   She has tried aleve (improves from 8 to 7/85) and heat application (minimal improvement). It bothers her at night if she rolls over, or as she gets out of bed.   Denies loss of bladder or bladder controll; denies injury.    Last lumbar xray 03/03/2009:   Findings: Bone density is within normal limits.  Five lumbar type  vertebral bodies are noted. Vertebral body heights and disc spaces are maintained.  No spondylosis or spondylolisthesis is seen and bony alignment is intact.  Minimal anterior lipping of the vertebral bodies is seen at the L4-L5 levels compatible with very early degenerative change.  No other degenerative changes are noted.  Surgical suture material is identified overlying the anterior abdomen.   IMPRESSION: Very mild degenerative changes of the anterior lower vertebral bodies.  Otherwise negative.  She has hx of recurrent iron deficiency anemia, previously est with hematology;  Lab Results  Component Value Date  WBC 7.1 08/09/2019   HGB 8.9 (L) 08/09/2019   HCT 29.6 (L) 08/09/2019   MCV 71.5 (L) 08/09/2019   PLT 434 (H) 08/09/2019   She was initiated on ferrous fumarate 324 mg BID which worked well for her for previous episodes via hematology;  Lab Results  Component Value  Date   IRON 21 (L) 07/22/2019   TIBC 491 (H) 07/22/2019   FERRITIN 5 (L) 08/09/2019      Past Medical History:  Diagnosis Date  . Allergic rhinitis   . Asthma   . DM (diabetes mellitus) (HCC)    borderline  . High cholesterol   . Hypertension   . Iron deficiency anemia   . Low blood potassium      Allergies  Allergen Reactions  . Tussionex Pennkinetic Er [Hydrocod Polst-Cpm Polst Er]     causes headache    Current Outpatient Medications on File Prior to Visit  Medication Sig  . albuterol (VENTOLIN HFA) 108 (90 Base) MCG/ACT inhaler Inhale 2 puffs into the lungs every 4 (four) hours as needed for wheezing or shortness of breath.  Marland Kitchen amLODipine (NORVASC) 10 MG tablet Take 1 tablet every Morning for BP  . benazepril (LOTENSIN) 40 MG tablet Take 1 tablet every night for BP & Diabetic Kidney Protection  . bisoprolol-hydrochlorothiazide (ZIAC) 10-6.25 MG tablet Take 1 tablet every Morning for BP  . Cholecalciferol 125 MCG (5000 UT) TABS Take 1 tablet (5,000 Units total) by mouth daily.  . Esomeprazole Magnesium (NEXIUM PO) Take 1 capsule by mouth daily. Taking OTC at night PRN  . Ferrous Fumarate (HEMOCYTE) 324 (106 Fe) MG TABS tablet Take 1 tablet (106 mg of iron total) by mouth 2 (two) times daily. Take with vitamin C supplement on an empty stomach.  . ferrous sulfate 325 (65 FE) MG EC tablet Take 325 mg by mouth 2 (two) times daily.  . hydrochlorothiazide (HYDRODIURIL) 25 MG tablet Take 1 tablet Daily for BP, Fluid Retention & Ankle Swelling  . ibuprofen (ADVIL,MOTRIN) 600 MG tablet Take 1 tablet (600 mg total) by mouth every 6 (six) hours as needed.  . metFORMIN (GLUCOPHAGE-XR) 500 MG 24 hr tablet TAKE 1 TO 2 TABLETS BY MOUTH TWICE A DAY WITH MEALS  . potassium chloride (KLOR-CON) 10 MEQ tablet Take 1 tablet (10 mEq total) by mouth daily. For low potassium.  . pravastatin (PRAVACHOL) 40 MG tablet Take 1 tablet at Bedtime for Cholesterol  . predniSONE (DELTASONE) 20 MG tablet 2  tablets daily for 3 days, 1 tablet daily for 4 days.   No current facility-administered medications on file prior to visit.    ROS: all negative except above.   Physical Exam:  BP 126/82   Pulse 65   Temp 98.1 F (36.7 C)   Wt 202 lb (91.6 kg)   SpO2 97%   BMI 40.80 kg/m   General Appearance: Well nourished, obese AA female in no apparent distress. Eyes: PERRLA, conjunctiva no swelling or erythema ENT/Mouth: Mask in place; Hearing normal.  Neck: Supple Respiratory: Respiratory effort normal, BS equal bilaterally without rales, rhonchi, wheezing or stridor.  Cardio: RRR with no MRGs. Brisk peripheral pulses without edema.  Abdomen: Soft, + BS.  Non tender. Lymphatics: Non tender without lymphadenopathy.  Musculoskeletal: Full ROM, Radicular symptoms with lumbar extension following flexion, not with rotation; No midline spine or SI joint tenderness; tenderness over gluteal muscle palpation; radicular pain with internal hip rotation, extension with figure 4, 5/5 strength, normal/non-antalgic gait. Neg straight leg raise.  Skin: Warm, dry without rashes, lesions, ecchymosis.  Neuro: Normal muscle tone, Sensation intact.  Psych: Awake and oriented X 3, normal affect, Insight and Judgment appropriate.     Dan Maker, NP 3:00 PM [facility]

## 2019-09-09 ENCOUNTER — Other Ambulatory Visit: Payer: Self-pay | Admitting: Adult Health

## 2019-09-09 DIAGNOSIS — E876 Hypokalemia: Secondary | ICD-10-CM

## 2019-09-09 DIAGNOSIS — R7989 Other specified abnormal findings of blood chemistry: Secondary | ICD-10-CM

## 2019-09-09 DIAGNOSIS — D509 Iron deficiency anemia, unspecified: Secondary | ICD-10-CM

## 2019-09-09 LAB — COMPLETE METABOLIC PANEL WITH GFR
AG Ratio: 1.5 (calc) (ref 1.0–2.5)
ALT: 30 U/L — ABNORMAL HIGH (ref 6–29)
AST: 20 U/L (ref 10–35)
Albumin: 4 g/dL (ref 3.6–5.1)
Alkaline phosphatase (APISO): 48 U/L (ref 31–125)
BUN: 7 mg/dL (ref 7–25)
CO2: 28 mmol/L (ref 20–32)
Calcium: 9.3 mg/dL (ref 8.6–10.2)
Chloride: 100 mmol/L (ref 98–110)
Creat: 0.59 mg/dL (ref 0.50–1.10)
GFR, Est African American: 127 mL/min/{1.73_m2} (ref >=60)
GFR, Est Non African American: 109 mL/min/{1.73_m2} (ref >=60)
Globulin: 2.7 g/dL (calc) (ref 1.9–3.7)
Glucose, Bld: 116 mg/dL — ABNORMAL HIGH (ref 65–99)
Potassium: 3.3 mmol/L — ABNORMAL LOW (ref 3.5–5.3)
Sodium: 137 mmol/L (ref 135–146)
Total Bilirubin: 0.3 mg/dL (ref 0.2–1.2)
Total Protein: 6.7 g/dL (ref 6.1–8.1)

## 2019-09-09 LAB — CBC WITH DIFFERENTIAL/PLATELET
Absolute Monocytes: 405 cells/uL (ref 200–950)
Basophils Absolute: 22 cells/uL (ref 0–200)
Basophils Relative: 0.5 %
Eosinophils Absolute: 141 cells/uL (ref 15–500)
Eosinophils Relative: 3.2 %
HCT: 33.1 % — ABNORMAL LOW (ref 35.0–45.0)
Hemoglobin: 10.5 g/dL — ABNORMAL LOW (ref 11.7–15.5)
Lymphs Abs: 1976 cells/uL (ref 850–3900)
MCH: 24.8 pg — ABNORMAL LOW (ref 27.0–33.0)
MCHC: 31.7 g/dL — ABNORMAL LOW (ref 32.0–36.0)
MCV: 78.3 fL — ABNORMAL LOW (ref 80.0–100.0)
MPV: 10.6 fL (ref 7.5–12.5)
Monocytes Relative: 9.2 %
Neutro Abs: 1857 cells/uL (ref 1500–7800)
Neutrophils Relative %: 42.2 %
Platelets: 310 10*3/uL (ref 140–400)
RBC: 4.23 10*6/uL (ref 3.80–5.10)
RDW: 22 % — ABNORMAL HIGH (ref 11.0–15.0)
Total Lymphocyte: 44.9 %
WBC: 4.4 10*3/uL (ref 3.8–10.8)

## 2019-09-17 ENCOUNTER — Other Ambulatory Visit: Payer: Self-pay | Admitting: Adult Health

## 2019-09-17 DIAGNOSIS — I10 Essential (primary) hypertension: Secondary | ICD-10-CM

## 2019-09-21 ENCOUNTER — Ambulatory Visit: Payer: BC Managed Care – PPO | Admitting: Adult Health

## 2019-09-24 ENCOUNTER — Other Ambulatory Visit: Payer: Self-pay | Admitting: Adult Health

## 2019-09-24 DIAGNOSIS — I10 Essential (primary) hypertension: Secondary | ICD-10-CM

## 2019-09-24 MED ORDER — BENAZEPRIL HCL 40 MG PO TABS: ORAL_TABLET | ORAL | 1 refills | Status: DC

## 2019-09-25 ENCOUNTER — Other Ambulatory Visit: Payer: Self-pay | Admitting: Adult Health

## 2019-09-25 DIAGNOSIS — D509 Iron deficiency anemia, unspecified: Secondary | ICD-10-CM

## 2019-10-13 ENCOUNTER — Other Ambulatory Visit: Payer: Self-pay | Admitting: Adult Health

## 2019-10-13 DIAGNOSIS — M25552 Pain in left hip: Secondary | ICD-10-CM

## 2019-10-14 ENCOUNTER — Other Ambulatory Visit: Payer: Self-pay | Admitting: Adult Health

## 2019-10-17 ENCOUNTER — Other Ambulatory Visit: Payer: Self-pay | Admitting: Internal Medicine

## 2019-10-17 DIAGNOSIS — I10 Essential (primary) hypertension: Secondary | ICD-10-CM

## 2019-10-18 ENCOUNTER — Other Ambulatory Visit: Payer: Self-pay | Admitting: Adult Health

## 2019-11-03 ENCOUNTER — Encounter: Payer: Self-pay | Admitting: Orthopaedic Surgery

## 2019-11-03 ENCOUNTER — Ambulatory Visit: Payer: Self-pay

## 2019-11-03 ENCOUNTER — Ambulatory Visit (INDEPENDENT_AMBULATORY_CARE_PROVIDER_SITE_OTHER): Payer: BC Managed Care – PPO | Admitting: Orthopaedic Surgery

## 2019-11-03 ENCOUNTER — Other Ambulatory Visit: Payer: Self-pay

## 2019-11-03 DIAGNOSIS — M5432 Sciatica, left side: Secondary | ICD-10-CM | POA: Diagnosis not present

## 2019-11-03 DIAGNOSIS — M5442 Lumbago with sciatica, left side: Secondary | ICD-10-CM | POA: Diagnosis not present

## 2019-11-03 MED ORDER — METHYLPREDNISOLONE 4 MG PO TABS: ORAL_TABLET | ORAL | 0 refills | Status: DC

## 2019-11-03 MED ORDER — MELOXICAM 15 MG PO TABS: 15.0000 mg | ORAL_TABLET | Freq: Every day | ORAL | 1 refills | Status: DC

## 2019-11-03 MED ORDER — METHOCARBAMOL 500 MG PO TABS: 500.0000 mg | ORAL_TABLET | Freq: Four times a day (QID) | ORAL | 1 refills | Status: DC | PRN

## 2019-11-03 NOTE — Progress Notes (Signed)
Office Visit Note   Patient: Alexis Marquez           Date of Birth: 03-22-1971           MRN: [id number] Visit Date: 11/03/2019              Requested by: Liane Comber, NP [address] [place],  [place] [address] PCP: Unk Pinto, MD   Assessment & Plan: Visit Diagnoses:  1. Sciatica, left side   2. Left-sided low back pain with left-sided sciatica, unspecified chronicity     Plan: I went over her x-ray findings and clinical exam findings.  I would like to start her on back extension exercises as well as try a steroid taper combined with meloxicam and Robaxin.  If this starts to worsen for her she will give [place] a call because I would recommend a MRI of the lumbar spine to rule out any nerve compression.  Otherwise, we will see her back in 3 weeks to see how she is doing overall.  I did offer her a work note but she says she is able to still work.  Follow-Up Instructions: Return in about 3 weeks (around 11/24/2019).   Orders:  Orders Placed This Encounter  Procedures  . XR Lumbar Spine 2-3 Views   Meds ordered this encounter  Medications  . methylPREDNISolone (MEDROL) 4 MG tablet    Sig: Medrol dose pack. Take as instructed    Dispense:  21 tablet    Refill:  0  . methocarbamol (ROBAXIN) 500 MG tablet    Sig: Take 1 tablet (500 mg total) by mouth every 6 (six) hours as needed for muscle spasms.    Dispense:  40 tablet    Refill:  1  . meloxicam (MOBIC) 15 MG tablet    Sig: Take 1 tablet (15 mg total) by mouth daily.    Dispense:  30 tablet    Refill:  1      Procedures: No procedures performed   Clinical Data: No additional findings.   Subjective: Chief Complaint  Patient presents with  . Lower Back - Pain  Patient is a very pleasant 49 year old female seen for the first time.  She reports a 37-month history of low back pain to the left side with numbness and tingling that radiates down to her left foot.  She is not a diabetic.  She  said if she has been sitting for long periods of times and been down it gets worse.  She denies any groin pain.  She denies any radicular symptoms down her right side.  She has had no previous hip or back surgery.  She reports similar sciatic symptoms several years ago that resolved after an injection and anti-inflammatories.  She denies any change in bowel bladder function either.  She does do a lot of repetitive activities at her job.  HPI  Review of Systems She currently denies any headache, chest pain, shortness of breath, fever, chills, nausea, vomiting  Objective: Vital Signs: There were no vitals taken for this visit.  Physical Exam She is alert and orient x3 and in no acute distress Ortho Exam Examination of her lumbar spine shows she has actually good flexion and extension and can bend over and touch her toes.  She does have moderate truncal obesity.  She has a mildly positive straight leg raise to the left side and on the right.  There is some subjective numbness in her left foot.  She has  5 out of 5 strength of her bilateral lower extremities and normal reflexes.  Her hip and knee exam bilaterally is normal. Specialty Comments:  No specialty comments available.  Imaging: XR Lumbar Spine 2-3 Views  Result Date: 11/03/2019 2 views of the lumbar spine show no acute findings.  The alignment is well-maintained.  I do feel that there is osteophytes at the upper lumbar spine that may be going into the foramina especially at L2-L3.  Otherwise disc heights and spaces appear to be just slightly narrow at several levels but not at L4-L5 or L5-S1.    PMFS History: Patient Active Problem List   Diagnosis Date Noted  . Right sided sciatica 07/22/2019  . Elevated LFTs 10/20/2018  . Morbid obesity (HCC) 06/11/2017  . Asthma 06/09/2015  . Hypokalemia 08/19/2014  . Medication management 09/30/2013  . Vitamin D deficiency 09/29/2013  . Menorrhagia with regular cycle 09/27/2013  . Essential  hypertension 09/07/2013  . Mixed hyperlipidemia 09/07/2013  . Other abnormal glucose (prediabetes)  09/07/2013  . Iron deficiency anemia 04/19/2013   Past Medical History:  Diagnosis Date  . Allergic rhinitis   . Asthma   . DM (diabetes mellitus) (HCC)    borderline  . High cholesterol   . Hypertension   . Iron deficiency anemia   . Low blood potassium     Family History  Problem Relation Age of Onset  . Diabetes Mother   . Stroke Mother   . Mitral valve prolapse Mother   . Diabetes Father   . Breast cancer Maternal Aunt   . Diabetes Maternal Aunt   . Heart disease Maternal Grandmother        great grandmother  . Pancreatic cancer Maternal Uncle   . CAD Neg Hx     Past Surgical History:  Procedure Laterality Date  . CESAREAN SECTION     x 4  . TUBAL LIGATION  1996  . UMBILICAL HERNIA REPAIR  1977   Social History   Occupational History  . Occupation: Warden/ranger: [employer]  Tobacco Use  . Smoking status: Never Smoker  . Smokeless tobacco: Never Used  Substance and Sexual Activity  . Alcohol use: No  . Drug use: No  . Sexual activity: Not Currently    Birth control/protection: None

## 2019-11-22 ENCOUNTER — Other Ambulatory Visit: Payer: Self-pay | Admitting: Internal Medicine

## 2019-11-22 DIAGNOSIS — Z1231 Encounter for screening mammogram for malignant neoplasm of breast: Secondary | ICD-10-CM

## 2019-11-24 ENCOUNTER — Ambulatory Visit: Payer: BC Managed Care – PPO | Admitting: Orthopaedic Surgery

## 2019-12-01 ENCOUNTER — Ambulatory Visit (INDEPENDENT_AMBULATORY_CARE_PROVIDER_SITE_OTHER): Payer: BC Managed Care – PPO | Admitting: Orthopaedic Surgery

## 2019-12-01 ENCOUNTER — Other Ambulatory Visit: Payer: Self-pay

## 2019-12-01 ENCOUNTER — Encounter: Payer: Self-pay | Admitting: Orthopaedic Surgery

## 2019-12-01 DIAGNOSIS — M5442 Lumbago with sciatica, left side: Secondary | ICD-10-CM

## 2019-12-01 DIAGNOSIS — M5432 Sciatica, left side: Secondary | ICD-10-CM | POA: Diagnosis not present

## 2019-12-01 NOTE — Progress Notes (Signed)
The patient comes in today with lessening low back pain and lessening sciatica.  She still reports some numbness in her feet but overall is made good improvement.  She is a prediabetic.  She does have moderate obesity.  She says her biggest complaint now is working all day long on concrete and her still toed boots.  She does state when she has been allowed to wear tennis shoes that does better for her.  On exam she has 5 out of 5 strength of her bilateral lower extremities and good mobility overall she has negative straight leg raise.  At this point I did give her a note saying that she should be allowed to wear regular tennis shoes at work and avoid steel toed shoes or boots.  I think this will help her.  Since she is doing much better overall follow-up can be as needed.  All questions and concerns were answered and addressed.

## 2019-12-19 ENCOUNTER — Other Ambulatory Visit: Payer: Self-pay | Admitting: Adult Health

## 2019-12-19 DIAGNOSIS — D509 Iron deficiency anemia, unspecified: Secondary | ICD-10-CM

## 2019-12-24 ENCOUNTER — Other Ambulatory Visit: Payer: Self-pay | Admitting: Internal Medicine

## 2019-12-24 DIAGNOSIS — E785 Hyperlipidemia, unspecified: Secondary | ICD-10-CM

## 2019-12-24 DIAGNOSIS — I10 Essential (primary) hypertension: Secondary | ICD-10-CM

## 2020-01-14 ENCOUNTER — Ambulatory Visit: Payer: BC Managed Care – PPO

## 2020-01-18 ENCOUNTER — Other Ambulatory Visit: Payer: Self-pay

## 2020-01-18 ENCOUNTER — Ambulatory Visit
Admission: RE | Admit: 2020-01-18 | Discharge: 2020-01-18 | Disposition: A | Discharge status: Home / Self Care | Payer: BC Managed Care – PPO | Source: Ambulatory Visit | Attending: Internal Medicine | Admitting: Internal Medicine

## 2020-01-18 DIAGNOSIS — Z1231 Encounter for screening mammogram for malignant neoplasm of breast: Secondary | ICD-10-CM | POA: Diagnosis not present

## 2020-01-19 NOTE — Progress Notes (Deleted)
FOLLOW UP  Assessment and Plan:   Hypertension Well controlled at this time with medications Monitor blood pressure at home; patient to call if consistently greater than 130/80 Continue DASH diet.   Reminder to go to the ER if any CP, SOB, nausea, dizziness, severe HA, changes vision/speech, left arm numbness and tingling and jaw pain.  Cholesterol Currently at goal; continue statin;  Continue low cholesterol diet and exercise.  Check lipid panel.   Prediabetes Continue medication: currently on metformin 500 mg BID Continue diet and exercise.  Perform daily foot/skin check, notify office of any concerning changes.  Check A1C  Morbid obesity - BMI *** Long discussion about weight loss, diet, and exercise Recommended diet heavy in fruits and veggies and low in animal meats, cheeses, and dairy products, appropriate calorie intake Discussed ideal weight for height and initial weight goal (190lb) She plans to start walking and make better choices when eating out, cut down on fried foods Will follow up in 3 months  Vitamin D Def Below goal at last check; she has not started supplement; suggested 5000 IU daily Continue to recommend supplementation to maintain goal of 60-100 *** Vit D level   Elevated LFTs MIld persistent; neg hepatitis panel 07/2019; needs [place] ***  Iron def anemia ***  Continue diet and meds as discussed. Further disposition pending results of labs. Discussed med's effects and SE's.   Over 30 minutes of exam, counseling, chart review, and critical decision making was performed.   Future Appointments  Date Time Provider Department Center  01/20/2020  8:45 AM Judd Gaudier, NP [facility] None  07/24/2020  9:00 AM Judd Gaudier, NP [facility] None    ----------------------------------------------------------------------------------------------------------------------  HPI 49 y.o. female  presents for 6 month follow up on hypertension, cholesterol,  prediabetes, morbid obesity and vitamin D deficiency.   She is a Dietitian.   She has intermittent recurrent sciatica sx; most recently on L; has seen Dr. Magnus Ivan, did steroid taper with improvement, also perceives benefit since switching from steel toed shoes to tennis shoes while at work. MRI is planned should her symptoms worsen again.   She has a history of asthma, hasn't had problems recently, may have some difficulty on very hot humid days. Uses PRN albuterol rarely.  She has had some GERD, back on nexium OTC night PRN, plans to taper soon.   BMI is There is no height or weight on file to calculate BMI., she has not been working on diet and exercise over the holiday. She has set a goal  Wt Readings from Last 3 Encounters:  09/08/19 202 lb (91.6 kg)  07/22/19 198 lb (89.8 kg)  10/19/18 201 lb 12.8 oz (91.5 kg)   Her blood pressure has been controlled at home (once a week, running 120s/75,etc), today their BP is    She does not workout. She denies chest pain, shortness of breath, dizziness.   She is on cholesterol medication (pravastatin 40 mg daily) and denies myalgias. Her cholesterol is at goal. The cholesterol last visit was:   Lab Results  Component Value Date   CHOL 168 07/22/2019   HDL 53 07/22/2019   LDLCALC 96 07/22/2019   TRIG 99 07/22/2019   CHOLHDL 3.2 07/22/2019    She has not been working on diet and exercise for prediabetes (on metformin 500 mg BID), and denies foot ulcerations, increased appetite, nausea, paresthesia of the feet, polydipsia, polyuria, visual disturbances, vomiting and weight loss. Last A1C in the office was:  Lab Results  Component Value Date   HGBA1C 5.8 (H) 07/22/2019   Patient admits she has not started on Vitamin D supplement but is willing to do so:   Lab Results  Component Value Date   VD25OH 12 (L) 07/22/2019     She has had mild persistent LFT elevation; negative hepatitis panel in 07/2019;  Lab Results   Component Value Date   ALT 30 (H) 09/08/2019   AST 20 09/08/2019   ALKPHOS 39 06/10/2016   BILITOT 0.3 09/08/2019   She has hx of recurrent iron deficiency anemia suspected due to menorrhagia; negative hemoccult x 3 in 07/2019 She admitted poor tolerance of iron supplement; on review had done well via hematology ferrocite 324 mg tabs and has been taking BID *** Lab Results  Component Value Date   WBC 4.4 09/08/2019   HGB 10.5 (L) 09/08/2019   HCT 33.1 (L) 09/08/2019   MCV 78.3 (L) 09/08/2019   PLT 310 09/08/2019   Lab Results  Component Value Date   IRON 21 (L) 07/22/2019   TIBC 491 (H) 07/22/2019   FERRITIN 5 (L) 08/09/2019     Current Medications:  Current Outpatient Medications on File Prior to Visit  Medication Sig  . albuterol (VENTOLIN HFA) 108 (90 Base) MCG/ACT inhaler Inhale 2 puffs into the lungs every 4 (four) hours as needed for wheezing or shortness of breath.  Marland Kitchen amLODipine (NORVASC) 10 MG tablet Take 1 tablet every Morning for BP  . benazepril (LOTENSIN) 40 MG tablet Take 1 tab daily for blood pressure.  . bisoprolol-hydrochlorothiazide (ZIAC) 10-6.25 MG tablet TAKE 1 TABLET EVERY MORNING FOR BP  . Cholecalciferol 125 MCG (5000 UT) TABS Take 1 tablet (5,000 Units total) by mouth daily.  . cyclobenzaprine (FLEXERIL) 5 MG tablet Take 1 tablet (5 mg total) by mouth 3 (three) times daily as needed for muscle spasms. May cause drowsiness, do not take if driving or with alcohol.  . Esomeprazole Magnesium (NEXIUM PO) Take 1 capsule by mouth daily. Taking OTC at night PRN  . FERROCITE 324 MG TABS tablet TAKE 1 TABLET BY MOUTH TWICE A DAY  . ferrous sulfate 325 (65 FE) MG EC tablet Take 325 mg by mouth 2 (two) times daily.  . hydrochlorothiazide (HYDRODIURIL) 25 MG tablet TAKE 1 TABLET BY MOUTH DAILY FOR BP, FLUID RETENTION & ANKLE SWELLING  . ibuprofen (ADVIL,MOTRIN) 600 MG tablet Take 1 tablet (600 mg total) by mouth every 6 (six) hours as needed.  . meloxicam (MOBIC) 15  MG tablet Take 1 tablet (15 mg total) by mouth daily.  . metFORMIN (GLUCOPHAGE-XR) 500 MG 24 hr tablet TAKE 1 TO 2 TABLETS BY MOUTH TWICE A DAY WITH MEALS  . methocarbamol (ROBAXIN) 500 MG tablet Take 1 tablet (500 mg total) by mouth every 6 (six) hours as needed for muscle spasms.  . methylPREDNISolone (MEDROL) 4 MG tablet Medrol dose pack. Take as instructed  . potassium chloride (KLOR-CON) 10 MEQ tablet TAKE 1 TABLET BY MOUTH DAILY FOR LOW POTASSIUM.  Marland Kitchen pravastatin (PRAVACHOL) 40 MG tablet TAKE 1 TABLET BY MOUTH AT BEDTIME FOR CHOLESTEROL   No current facility-administered medications on file prior to visit.     Allergies:  Allergies  Allergen Reactions  . Tussionex Pennkinetic Er [Hydrocod Polst-Cpm Polst Er]     causes headache     Medical History:  Past Medical History:  Diagnosis Date  . Allergic rhinitis   . Asthma   . DM (diabetes mellitus) ([place])  borderline  . High cholesterol   . Hypertension   . Iron deficiency anemia   . Low blood potassium    Family history- Reviewed and unchanged Social history- Reviewed and unchanged   Review of Systems:  Review of Systems  Constitutional: Negative for malaise/fatigue and weight loss.  HENT: Negative for hearing loss and tinnitus.   Eyes: Negative for blurred vision and double vision.  Respiratory: Negative for cough, shortness of breath and wheezing.   Cardiovascular: Negative for chest pain, palpitations, orthopnea, claudication and leg swelling.  Gastrointestinal: Negative for abdominal pain, blood in stool, constipation, diarrhea, heartburn, melena, nausea and vomiting.  Genitourinary: Negative.   Musculoskeletal: Negative for joint pain and myalgias.  Skin: Negative for rash.  Neurological: Negative for dizziness, tingling, sensory change, weakness and headaches.  Endo/Heme/Allergies: Negative for polydipsia.  Psychiatric/Behavioral: Negative.   All other systems reviewed and are negative.    Physical  Exam: There were no vitals taken for this visit. Wt Readings from Last 3 Encounters:  09/08/19 202 lb (91.6 kg)  07/22/19 198 lb (89.8 kg)  10/19/18 201 lb 12.8 oz (91.5 kg)   General Appearance: Well nourished, in no apparent distress. Eyes: PERRLA, EOMs, conjunctiva no swelling or erythema Sinuses: No Frontal/maxillary tenderness ENT/Mouth: Ext aud canals clear, TMs without erythema, bulging. No erythema, swelling, or exudate on post pharynx.  Tonsils not swollen or erythematous. Hearing normal.  Neck: Supple, thyroid normal.  Respiratory: Respiratory effort normal, BS equal bilaterally without rales, rhonchi, wheezing or stridor.  Cardio: RRR with no MRGs. Brisk peripheral pulses without edema.  Abdomen: Soft, + BS.  Non tender, no guarding, rebound, hernias, masses. Lymphatics: Non tender without lymphadenopathy.  Musculoskeletal: Full ROM, 5/5 strength, Normal gait Skin: Warm, dry without rashes, lesions, ecchymosis.  Neuro: Cranial nerves intact. No cerebellar symptoms.  Psych: Awake and oriented X 3, normal affect, Insight and Judgment appropriate.    Dan Maker, NP 8:48 AM [facility]

## 2020-01-20 ENCOUNTER — Ambulatory Visit: Payer: BC Managed Care – PPO | Admitting: Adult Health

## 2020-01-21 ENCOUNTER — Other Ambulatory Visit: Payer: Self-pay | Admitting: Internal Medicine

## 2020-01-21 DIAGNOSIS — R928 Other abnormal and inconclusive findings on diagnostic imaging of breast: Secondary | ICD-10-CM

## 2020-01-31 ENCOUNTER — Other Ambulatory Visit: Payer: Self-pay | Admitting: Adult Health

## 2020-01-31 DIAGNOSIS — D509 Iron deficiency anemia, unspecified: Secondary | ICD-10-CM

## 2020-02-01 ENCOUNTER — Ambulatory Visit
Admission: RE | Admit: 2020-02-01 | Discharge: 2020-02-01 | Disposition: A | Discharge status: Home / Self Care | Payer: BC Managed Care – PPO | Source: Ambulatory Visit | Attending: Internal Medicine | Admitting: Internal Medicine

## 2020-02-01 ENCOUNTER — Other Ambulatory Visit: Payer: Self-pay

## 2020-02-01 DIAGNOSIS — N6011 Diffuse cystic mastopathy of right breast: Secondary | ICD-10-CM | POA: Diagnosis not present

## 2020-02-01 DIAGNOSIS — R928 Other abnormal and inconclusive findings on diagnostic imaging of breast: Secondary | ICD-10-CM | POA: Diagnosis not present

## 2020-02-01 DIAGNOSIS — N6012 Diffuse cystic mastopathy of left breast: Secondary | ICD-10-CM | POA: Diagnosis not present

## 2020-02-02 NOTE — Progress Notes (Deleted)
FOLLOW UP  Assessment and Plan:   Hypertension Well controlled at this time with medications Monitor blood pressure at home; patient to call if consistently greater than 130/80 Continue DASH diet.   Reminder to go to the ER if any CP, SOB, nausea, dizziness, severe HA, changes vision/speech, left arm numbness and tingling and jaw pain.  Cholesterol Currently at goal; continue statin;  Continue low cholesterol diet and exercise.  Check lipid panel.   Prediabetes Continue medication: currently on metformin 500 mg BID Continue diet and exercise.  Perform daily foot/skin check, notify office of any concerning changes.  Check A1C  Morbid obesity - BMI *** Long discussion about weight loss, diet, and exercise Recommended diet heavy in fruits and veggies and low in animal meats, cheeses, and dairy products, appropriate calorie intake Discussed ideal weight for height and initial weight goal (190lb) She plans to start walking and make better choices when eating out, cut down on fried foods Will follow up in 3 months  Vitamin D Def Below goal at last check; she has not started supplement; suggested 5000 IU daily Continue to recommend supplementation to maintain goal of 60-100 *** Vit D level   Elevated LFTs MIld persistent; neg hepatitis panel 07/2019; needs [place] ***  Iron def anemia ***  Continue diet and meds as discussed. Further disposition pending results of labs. Discussed med's effects and SE's.   Over 30 minutes of exam, counseling, chart review, and critical decision making was performed.   Future Appointments  Date Time Provider Department Center  02/03/2020  4:15 PM Judd Gaudier, NP [facility] None  07/24/2020  9:00 AM Judd Gaudier, NP [facility] None    ----------------------------------------------------------------------------------------------------------------------  HPI 48 y.o. female  presents for 6 month follow up on hypertension, cholesterol,  prediabetes, morbid obesity and vitamin D deficiency.   She is a Dietitian.   She has intermittent recurrent sciatica sx; most recently on L; has seen Dr. Magnus Ivan, did steroid taper with improvement, also perceives benefit since switching from steel toed shoes to tennis shoes while at work. MRI is planned should her symptoms worsen again.   She has a history of asthma, hasn't had problems recently, may have some difficulty on very hot humid days. Uses PRN albuterol rarely.  She has had some GERD, back on nexium OTC night PRN, plans to taper soon.   BMI is There is no height or weight on file to calculate BMI., she has not been working on diet and exercise over the holiday. She has set a goal  Wt Readings from Last 3 Encounters:  09/08/19 202 lb (91.6 kg)  07/22/19 198 lb (89.8 kg)  10/19/18 201 lb 12.8 oz (91.5 kg)   ON HCTZ 25, ziac, norvasc, benazapril and potassium *** try switching HCTZ to spironolactone and stopping potassium supplement? *** Her blood pressure has been controlled at home (once a week, running 120s/75,etc), today their BP is    She does not workout. She denies chest pain, shortness of breath, dizziness.   She is on cholesterol medication (pravastatin 40 mg daily) and denies myalgias. Her cholesterol is at goal. The cholesterol last visit was:   Lab Results  Component Value Date   CHOL 168 07/22/2019   HDL 53 07/22/2019   LDLCALC 96 07/22/2019   TRIG 99 07/22/2019   CHOLHDL 3.2 07/22/2019    She has not been working on diet and exercise for prediabetes (on metformin 500 mg BID), and denies foot ulcerations, increased appetite,  nausea, paresthesia of the feet, polydipsia, polyuria, visual disturbances, vomiting and weight loss. Last A1C in the office was:  Lab Results  Component Value Date   HGBA1C 5.8 (H) 07/22/2019   Patient admits she has not started on Vitamin D supplement but is willing to do so:   Lab Results  Component Value  Date   VD25OH 12 (L) 07/22/2019     She has had mild persistent LFT elevation; negative hepatitis panel in 07/2019;  Lab Results  Component Value Date   ALT 30 (H) 09/08/2019   AST 20 09/08/2019   ALKPHOS 39 06/10/2016   BILITOT 0.3 09/08/2019   She has hx of recurrent iron deficiency anemia suspected due to menorrhagia; negative hemoccult x 3 in 07/2019 She admitted poor tolerance of iron supplement; on review had done well via hematology ferrocite 324 mg tabs and has been taking BID *** Lab Results  Component Value Date   WBC 4.4 09/08/2019   HGB 10.5 (L) 09/08/2019   HCT 33.1 (L) 09/08/2019   MCV 78.3 (L) 09/08/2019   PLT 310 09/08/2019   Lab Results  Component Value Date   IRON 21 (L) 07/22/2019   TIBC 491 (H) 07/22/2019   FERRITIN 5 (L) 08/09/2019     Current Medications:  Current Outpatient Medications on File Prior to Visit  Medication Sig  . albuterol (VENTOLIN HFA) 108 (90 Base) MCG/ACT inhaler Inhale 2 puffs into the lungs every 4 (four) hours as needed for wheezing or shortness of breath.  Marland Kitchen amLODipine (NORVASC) 10 MG tablet Take 1 tablet every Morning for BP  . benazepril (LOTENSIN) 40 MG tablet Take 1 tab daily for blood pressure.  . bisoprolol-hydrochlorothiazide (ZIAC) 10-6.25 MG tablet TAKE 1 TABLET EVERY MORNING FOR BP  . Cholecalciferol 125 MCG (5000 UT) TABS Take 1 tablet (5,000 Units total) by mouth daily.  . cyclobenzaprine (FLEXERIL) 5 MG tablet Take 1 tablet (5 mg total) by mouth 3 (three) times daily as needed for muscle spasms. May cause drowsiness, do not take if driving or with alcohol.  . Esomeprazole Magnesium (NEXIUM PO) Take 1 capsule by mouth daily. Taking OTC at night PRN  . Ferrous Fumarate (FERROCITE) 324 (106 Fe) MG TABS tablet Take 1 tablet 2 x /day for Anemia  . hydrochlorothiazide (HYDRODIURIL) 25 MG tablet TAKE 1 TABLET BY MOUTH DAILY FOR BP, FLUID RETENTION & ANKLE SWELLING  . ibuprofen (ADVIL,MOTRIN) 600 MG tablet Take 1 tablet (600  mg total) by mouth every 6 (six) hours as needed.  . meloxicam (MOBIC) 15 MG tablet Take 1 tablet (15 mg total) by mouth daily.  . metFORMIN (GLUCOPHAGE-XR) 500 MG 24 hr tablet TAKE 1 TO 2 TABLETS BY MOUTH TWICE A DAY WITH MEALS  . methocarbamol (ROBAXIN) 500 MG tablet Take 1 tablet (500 mg total) by mouth every 6 (six) hours as needed for muscle spasms.  . methylPREDNISolone (MEDROL) 4 MG tablet Medrol dose pack. Take as instructed  . potassium chloride (KLOR-CON) 10 MEQ tablet TAKE 1 TABLET BY MOUTH DAILY FOR LOW POTASSIUM.  Marland Kitchen pravastatin (PRAVACHOL) 40 MG tablet TAKE 1 TABLET BY MOUTH AT BEDTIME FOR CHOLESTEROL   No current facility-administered medications on file prior to visit.     Allergies:  Allergies  Allergen Reactions  . Tussionex Pennkinetic Er [Hydrocod Polst-Cpm Polst Er]     causes headache     Medical History:  Past Medical History:  Diagnosis Date  . Allergic rhinitis   . Asthma   . DM (diabetes  mellitus) (HCC)    borderline  . High cholesterol   . Hypertension   . Iron deficiency anemia   . Low blood potassium    Family history- Reviewed and unchanged Social history- Reviewed and unchanged   Review of Systems:  Review of Systems  Constitutional: Negative for malaise/fatigue and weight loss.  HENT: Negative for hearing loss and tinnitus.   Eyes: Negative for blurred vision and double vision.  Respiratory: Negative for cough, shortness of breath and wheezing.   Cardiovascular: Negative for chest pain, palpitations, orthopnea, claudication and leg swelling.  Gastrointestinal: Negative for abdominal pain, blood in stool, constipation, diarrhea, heartburn, melena, nausea and vomiting.  Genitourinary: Negative.   Musculoskeletal: Negative for joint pain and myalgias.  Skin: Negative for rash.  Neurological: Negative for dizziness, tingling, sensory change, weakness and headaches.  Endo/Heme/Allergies: Negative for polydipsia.  Psychiatric/Behavioral:  Negative.   All other systems reviewed and are negative.    Physical Exam: There were no vitals taken for this visit. Wt Readings from Last 3 Encounters:  09/08/19 202 lb (91.6 kg)  07/22/19 198 lb (89.8 kg)  10/19/18 201 lb 12.8 oz (91.5 kg)   General Appearance: Well nourished, in no apparent distress. Eyes: PERRLA, EOMs, conjunctiva no swelling or erythema Sinuses: No Frontal/maxillary tenderness ENT/Mouth: Ext aud canals clear, TMs without erythema, bulging. No erythema, swelling, or exudate on post pharynx.  Tonsils not swollen or erythematous. Hearing normal.  Neck: Supple, thyroid normal.  Respiratory: Respiratory effort normal, BS equal bilaterally without rales, rhonchi, wheezing or stridor.  Cardio: RRR with no MRGs. Brisk peripheral pulses without edema.  Abdomen: Soft, + BS.  Non tender, no guarding, rebound, hernias, masses. Lymphatics: Non tender without lymphadenopathy.  Musculoskeletal: Full ROM, 5/5 strength, Normal gait Skin: Warm, dry without rashes, lesions, ecchymosis.  Neuro: Cranial nerves intact. No cerebellar symptoms.  Psych: Awake and oriented X 3, normal affect, Insight and Judgment appropriate.    Dan Maker, NP 1:25 PM [facility]

## 2020-02-03 ENCOUNTER — Ambulatory Visit: Payer: BC Managed Care – PPO | Admitting: Adult Health

## 2020-02-06 ENCOUNTER — Other Ambulatory Visit: Payer: Self-pay | Admitting: Adult Health

## 2020-02-15 NOTE — Progress Notes (Deleted)
FOLLOW UP  Assessment and Plan:   Hypertension Well controlled at this time with medications Monitor blood pressure at home; patient to call if consistently greater than 130/80 Continue DASH diet.   Reminder to go to the ER if any CP, SOB, nausea, dizziness, severe HA, changes vision/speech, left arm numbness and tingling and jaw pain.  Cholesterol Currently at goal; continue statin;  Continue low cholesterol diet and exercise.  Check lipid panel.   Prediabetes Continue medication: currently on metformin 500 mg BID Continue diet and exercise.  Perform daily foot/skin check, notify office of any concerning changes.  Check A1C  Morbid obesity - BMI *** Long discussion about weight loss, diet, and exercise Recommended diet heavy in fruits and veggies and low in animal meats, cheeses, and dairy products, appropriate calorie intake Discussed ideal weight for height and initial weight goal (190lb) She plans to start walking and make better choices when eating out, cut down on fried foods Will follow up in 3 months  Vitamin D Def Below goal at last check; she has not started supplement; suggested 5000 IU daily Continue to recommend supplementation to maintain goal of 60-100 *** Vit D level   Elevated LFTs MIld persistent; neg hepatitis panel 07/2019; needs [place] ***  Iron def anemia ***  Continue diet and meds as discussed. Further disposition pending results of labs. Discussed med's effects and SE's.   Over 30 minutes of exam, counseling, chart review, and critical decision making was performed.   Future Appointments  Date Time Provider Department Center  02/16/2020 10:30 AM Judd Gaudier, NP [facility] None  07/24/2020  9:00 AM Judd Gaudier, NP [facility] None    ----------------------------------------------------------------------------------------------------------------------  HPI 49 y.o. female  presents for 6 month follow up on hypertension, cholesterol,  prediabetes, morbid obesity and vitamin D deficiency.   She is a Dietitian.   She has intermittent recurrent sciatica sx; most recently on L; has seen Dr. Magnus Ivan, did steroid taper with improvement, also perceives benefit since switching from steel toed shoes to tennis shoes while at work. MRI is planned should her symptoms worsen again.   She has a history of asthma, hasn't had problems recently, may have some difficulty on very hot humid days. Uses PRN albuterol rarely.  She has had some GERD, back on nexium OTC night PRN, plans to taper soon.   BMI is There is no height or weight on file to calculate BMI., she has not been working on diet and exercise over the holiday. She has set a goal  Wt Readings from Last 3 Encounters:  09/08/19 202 lb (91.6 kg)  07/22/19 198 lb (89.8 kg)  10/19/18 201 lb 12.8 oz (91.5 kg)   ON HCTZ 25, ziac, norvasc, benazapril and potassium *** try switching HCTZ to spironolactone and stopping potassium supplement? *** Her blood pressure has been controlled at home (once a week, running 120s/75,etc), today their BP is    She does not workout. She denies chest pain, shortness of breath, dizziness.   She is on cholesterol medication (pravastatin 40 mg daily) and denies myalgias. Her cholesterol is at goal. The cholesterol last visit was:   Lab Results  Component Value Date   CHOL 168 07/22/2019   HDL 53 07/22/2019   LDLCALC 96 07/22/2019   TRIG 99 07/22/2019   CHOLHDL 3.2 07/22/2019    She has not been working on diet and exercise for prediabetes (on metformin 500 mg BID), and denies foot ulcerations, increased appetite, nausea,  paresthesia of the feet, polydipsia, polyuria, visual disturbances, vomiting and weight loss. Last A1C in the office was:  Lab Results  Component Value Date   HGBA1C 5.8 (H) 07/22/2019   Patient admits she has not started on Vitamin D supplement but is willing to do so:   Lab Results  Component Value  Date   VD25OH 12 (L) 07/22/2019     She has had mild persistent LFT elevation; negative hepatitis panel in 07/2019;  Lab Results  Component Value Date   ALT 30 (H) 09/08/2019   AST 20 09/08/2019   ALKPHOS 39 06/10/2016   BILITOT 0.3 09/08/2019   She has hx of recurrent iron deficiency anemia suspected due to menorrhagia; negative hemoccult x 3 in 07/2019 She admitted poor tolerance of iron supplement; on review had done well via hematology ferrocite 324 mg tabs and has been taking BID *** Lab Results  Component Value Date   WBC 4.4 09/08/2019   HGB 10.5 (L) 09/08/2019   HCT 33.1 (L) 09/08/2019   MCV 78.3 (L) 09/08/2019   PLT 310 09/08/2019   Lab Results  Component Value Date   IRON 21 (L) 07/22/2019   TIBC 491 (H) 07/22/2019   FERRITIN 5 (L) 08/09/2019     Current Medications:  Current Outpatient Medications on File Prior to Visit  Medication Sig  . albuterol (VENTOLIN HFA) 108 (90 Base) MCG/ACT inhaler Inhale 2 puffs into the lungs every 4 (four) hours as needed for wheezing or shortness of breath.  Marland Kitchen amLODipine (NORVASC) 10 MG tablet Take 1 tablet every Morning for BP  . benazepril (LOTENSIN) 40 MG tablet Take 1 tab daily for blood pressure.  . bisoprolol-hydrochlorothiazide (ZIAC) 10-6.25 MG tablet TAKE 1 TABLET EVERY MORNING FOR BP  . Cholecalciferol ([employer] D3) 125 MCG (5000 UT) capsule Take 1 capsule Daily for Vitamin  D Deficiency  . cyclobenzaprine (FLEXERIL) 5 MG tablet Take 1 tablet (5 mg total) by mouth 3 (three) times daily as needed for muscle spasms. May cause drowsiness, do not take if driving or with alcohol.  . Esomeprazole Magnesium (NEXIUM PO) Take 1 capsule by mouth daily. Taking OTC at night PRN  . Ferrous Fumarate (FERROCITE) 324 (106 Fe) MG TABS tablet Take 1 tablet 2 x /day for Anemia  . hydrochlorothiazide (HYDRODIURIL) 25 MG tablet TAKE 1 TABLET BY MOUTH DAILY FOR BP, FLUID RETENTION & ANKLE SWELLING  . ibuprofen (ADVIL,MOTRIN) 600 MG tablet Take 1  tablet (600 mg total) by mouth every 6 (six) hours as needed.  . meloxicam (MOBIC) 15 MG tablet Take 1 tablet (15 mg total) by mouth daily.  . metFORMIN (GLUCOPHAGE-XR) 500 MG 24 hr tablet TAKE 1 TO 2 TABLETS BY MOUTH TWICE A DAY WITH MEALS  . methocarbamol (ROBAXIN) 500 MG tablet Take 1 tablet (500 mg total) by mouth every 6 (six) hours as needed for muscle spasms.  . methylPREDNISolone (MEDROL) 4 MG tablet Medrol dose pack. Take as instructed  . potassium chloride (KLOR-CON) 10 MEQ tablet TAKE 1 TABLET BY MOUTH DAILY FOR LOW POTASSIUM.  Marland Kitchen pravastatin (PRAVACHOL) 40 MG tablet TAKE 1 TABLET BY MOUTH AT BEDTIME FOR CHOLESTEROL   No current facility-administered medications on file prior to visit.     Allergies:  Allergies  Allergen Reactions  . Tussionex Pennkinetic Er [Hydrocod Polst-Cpm Polst Er]     causes headache     Medical History:  Past Medical History:  Diagnosis Date  . Allergic rhinitis   . Asthma   . DM (  diabetes mellitus) (HCC)    borderline  . High cholesterol   . Hypertension   . Iron deficiency anemia   . Low blood potassium    Family history- Reviewed and unchanged Social history- Reviewed and unchanged   Review of Systems:  Review of Systems  Constitutional: Negative for malaise/fatigue and weight loss.  HENT: Negative for hearing loss and tinnitus.   Eyes: Negative for blurred vision and double vision.  Respiratory: Negative for cough, shortness of breath and wheezing.   Cardiovascular: Negative for chest pain, palpitations, orthopnea, claudication and leg swelling.  Gastrointestinal: Negative for abdominal pain, blood in stool, constipation, diarrhea, heartburn, melena, nausea and vomiting.  Genitourinary: Negative.   Musculoskeletal: Negative for joint pain and myalgias.  Skin: Negative for rash.  Neurological: Negative for dizziness, tingling, sensory change, weakness and headaches.  Endo/Heme/Allergies: Negative for polydipsia.   Psychiatric/Behavioral: Negative.   All other systems reviewed and are negative.    Physical Exam: There were no vitals taken for this visit. Wt Readings from Last 3 Encounters:  09/08/19 202 lb (91.6 kg)  07/22/19 198 lb (89.8 kg)  10/19/18 201 lb 12.8 oz (91.5 kg)   General Appearance: Well nourished, in no apparent distress. Eyes: PERRLA, EOMs, conjunctiva no swelling or erythema Sinuses: No Frontal/maxillary tenderness ENT/Mouth: Ext aud canals clear, TMs without erythema, bulging. No erythema, swelling, or exudate on post pharynx.  Tonsils not swollen or erythematous. Hearing normal.  Neck: Supple, thyroid normal.  Respiratory: Respiratory effort normal, BS equal bilaterally without rales, rhonchi, wheezing or stridor.  Cardio: RRR with no MRGs. Brisk peripheral pulses without edema.  Abdomen: Soft, + BS.  Non tender, no guarding, rebound, hernias, masses. Lymphatics: Non tender without lymphadenopathy.  Musculoskeletal: Full ROM, 5/5 strength, Normal gait Skin: Warm, dry without rashes, lesions, ecchymosis.  Neuro: Cranial nerves intact. No cerebellar symptoms.  Psych: Awake and oriented X 3, normal affect, Insight and Judgment appropriate.    Dan Maker, NP 8:14 AM [facility]

## 2020-02-16 ENCOUNTER — Ambulatory Visit: Payer: BC Managed Care – PPO | Admitting: Adult Health

## 2020-03-06 IMAGING — MG DIGITAL SCREENING BILATERAL MAMMOGRAM WITH TOMO AND CAD
8 series · 8 of 24 positions shown · non-contrast
Comparison: Previous exam(s).

CLINICAL DATA: Screening.

EXAM:
DIGITAL SCREENING BILATERAL MAMMOGRAM WITH TOMO AND CAD

[L MLO synth-2D]
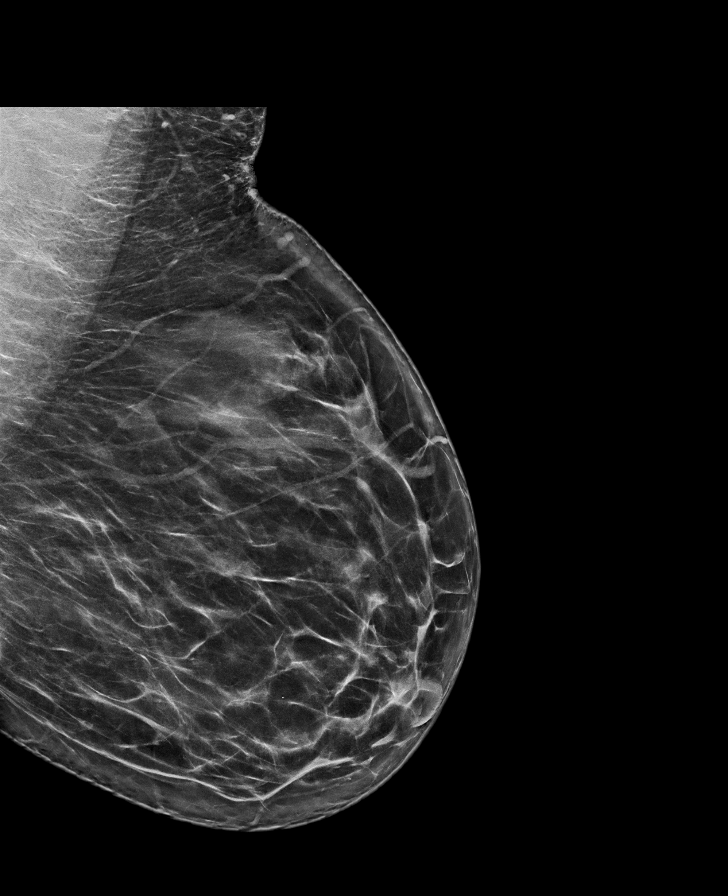

[L CC synth-2D]
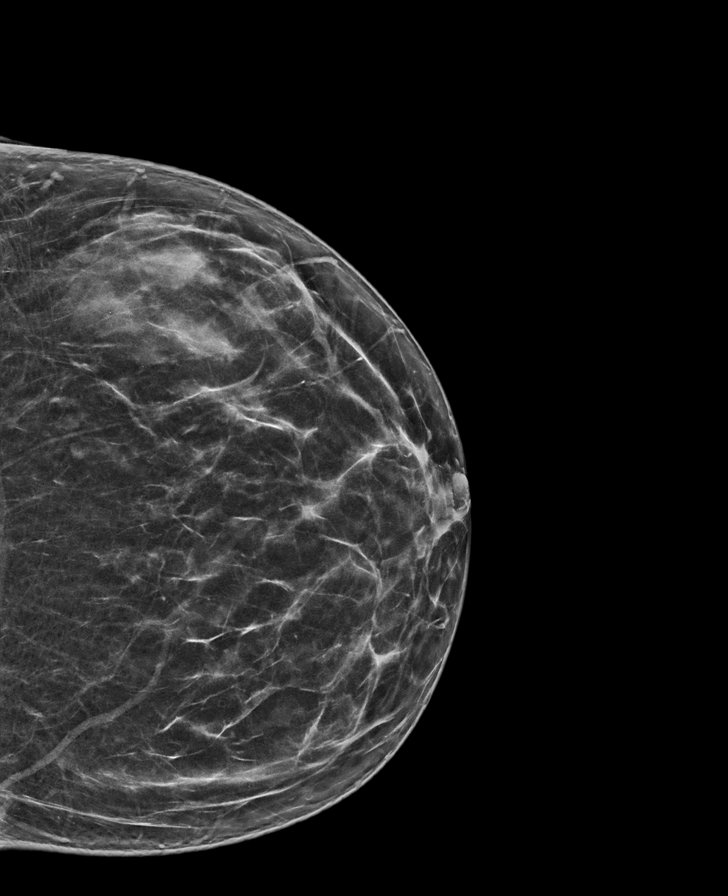

[R MLO synth-2D]
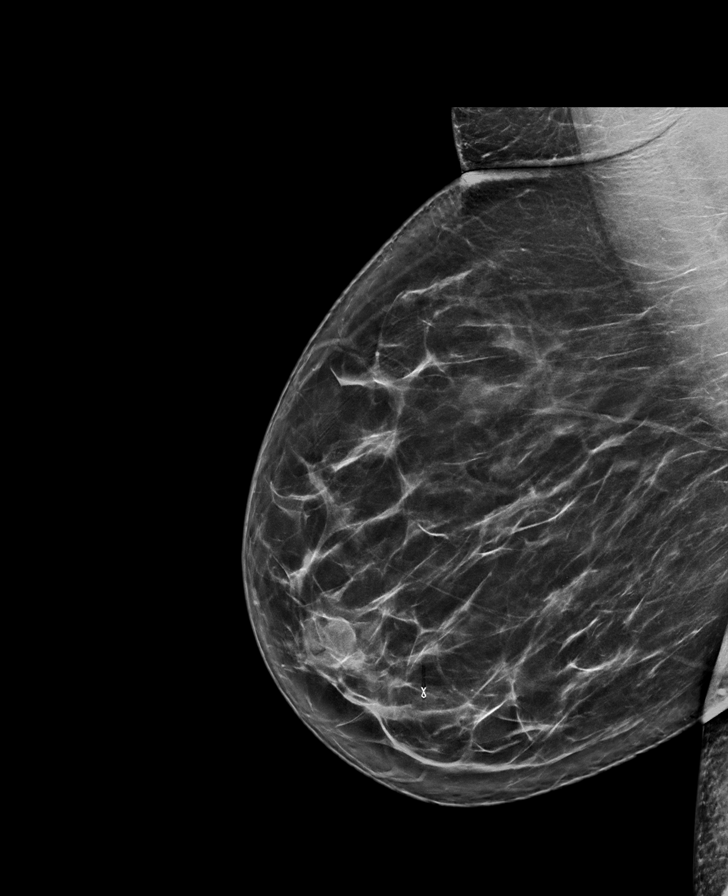

[R CC synth-2D]
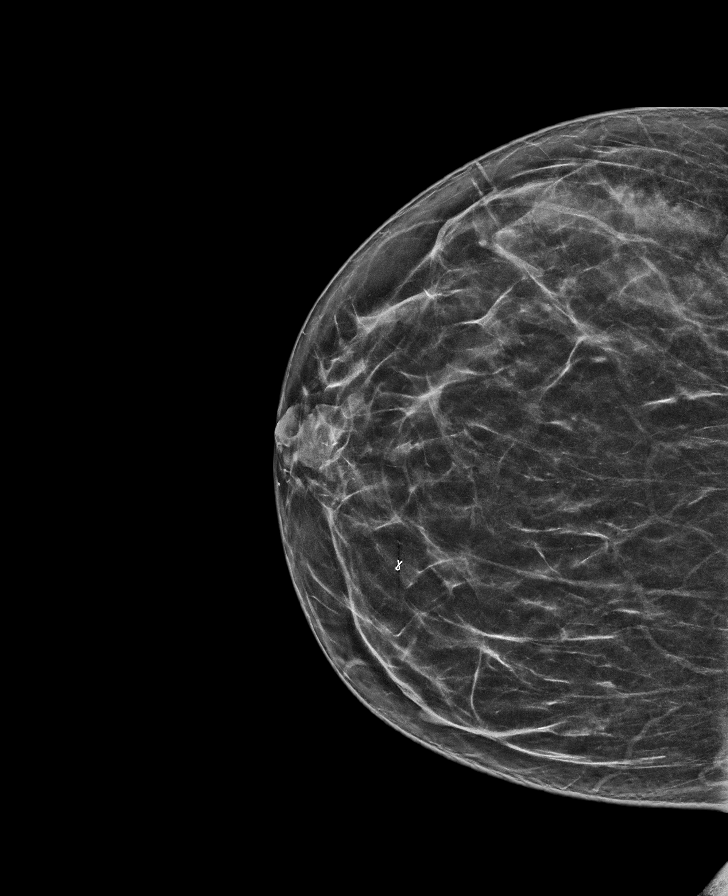

[L MLO tomo · tomo slice 41/82.0]
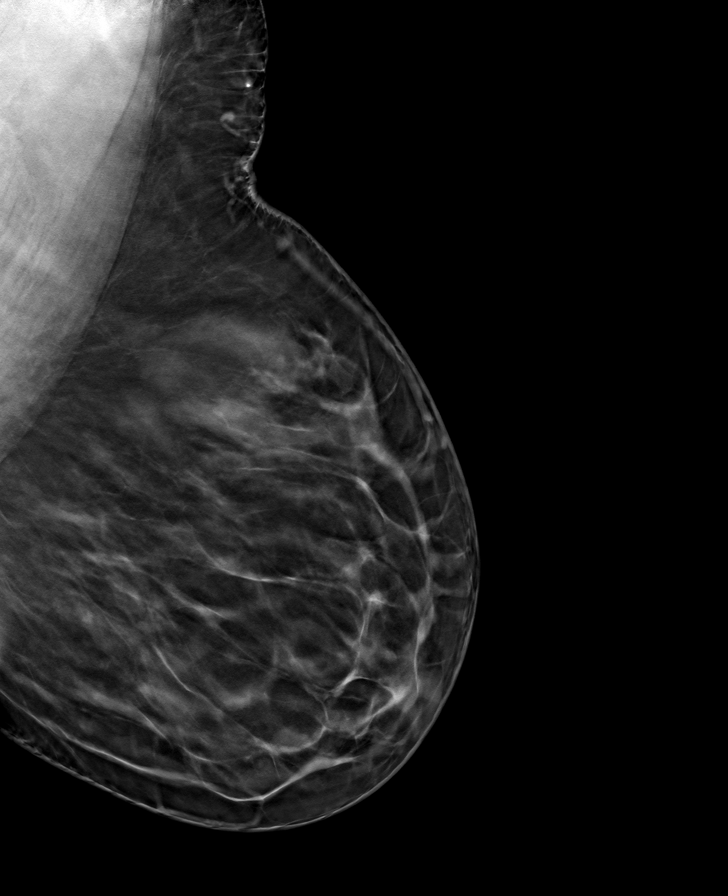

[R MLO tomo · tomo slice 43/84.0]
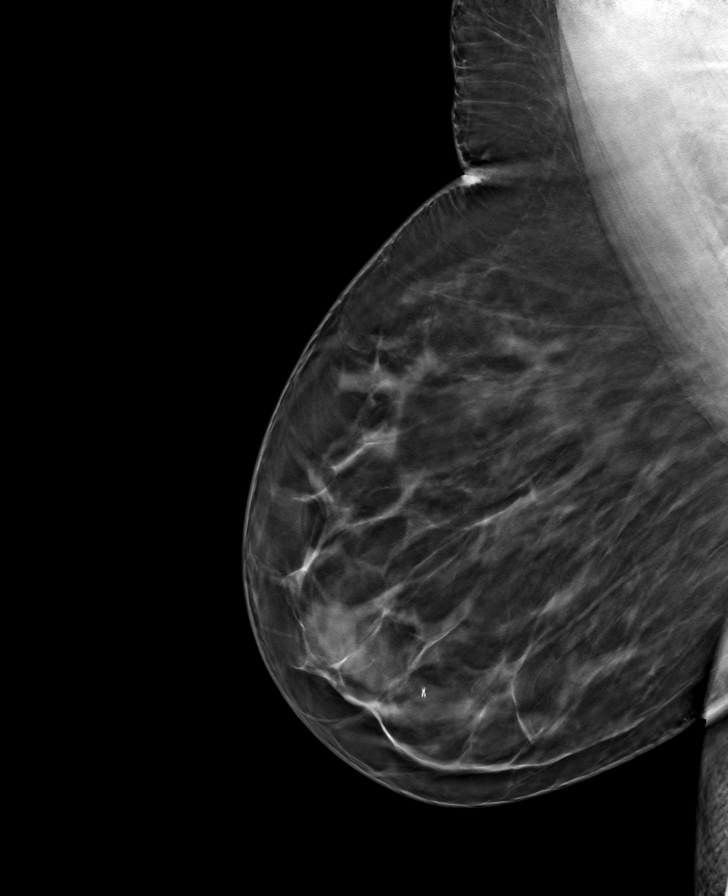

[R CC tomo · tomo slice 34/67.0]
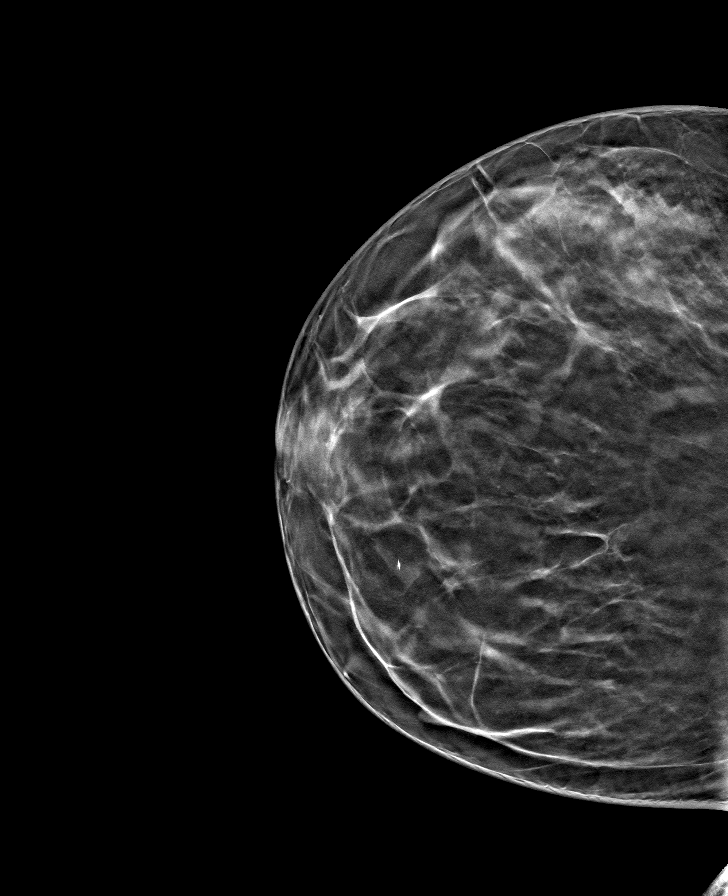

[L CC tomo · tomo slice 33/66.0]
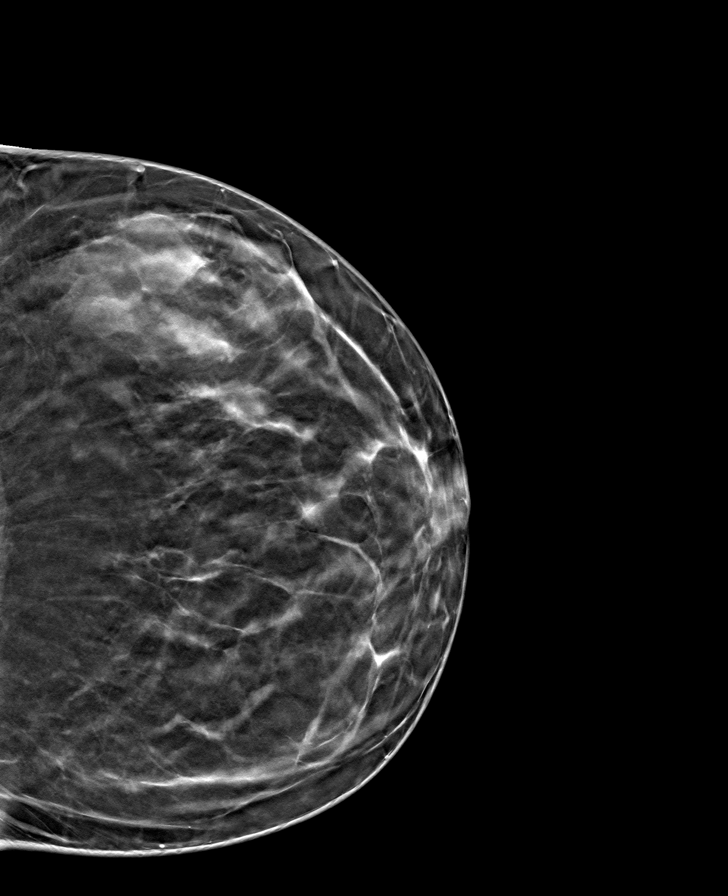

[8 of 24 positions shown; findings below may reference images not displayed]

ACR Breast Density Category b: There are scattered areas of
fibroglandular density.
FINDINGS: There are no findings suspicious for malignancy. Images were
processed with CAD.
IMPRESSION: No mammographic evidence of malignancy. A result letter of this
screening mammogram will be mailed directly to the patient.

RECOMMENDATION:
Screening mammogram in one year. (Code:[id number])

BI-RADS CATEGORY  1: Negative.

## 2020-03-09 ENCOUNTER — Ambulatory Visit: Payer: BC Managed Care – PPO | Admitting: Adult Health

## 2020-03-09 NOTE — Progress Notes (Deleted)
FOLLOW UP  Assessment and Plan:   Hypertension Well controlled at this time with medications Monitor blood pressure at home; patient to call if consistently greater than 130/80 Continue DASH diet.   Reminder to go to the ER if any CP, SOB, nausea, dizziness, severe HA, changes vision/speech, left arm numbness and tingling and jaw pain.  Cholesterol Currently at goal; continue statin;  Continue low cholesterol diet and exercise.  Check lipid panel.   Prediabetes Continue medication: currently on metformin 500 mg BID Continue diet and exercise.  Perform daily foot/skin check, notify office of any concerning changes.  Check A1C  Morbid obesity - BMI *** Long discussion about weight loss, diet, and exercise Recommended diet heavy in fruits and veggies and low in animal meats, cheeses, and dairy products, appropriate calorie intake Discussed ideal weight for height and initial weight goal (190lb) She plans to start walking and make better choices when eating out, cut down on fried foods Will follow up in 3 months  Vitamin D Def Below goal at last check; she has not started supplement; suggested 5000 IU daily Continue to recommend supplementation to maintain goal of 60-100 *** Vit D level   Elevated LFTs MIld persistent; neg hepatitis panel 07/2019; needs [place] ***  Iron def anemia ***  Continue diet and meds as discussed. Further disposition pending results of labs. Discussed med's effects and SE's.   Over 30 minutes of exam, counseling, chart review, and critical decision making was performed.   Future Appointments  Date Time Provider Department Center  03/09/2020  4:00 PM Judd Gaudier, NP [facility] None  07/24/2020  9:00 AM Judd Gaudier, NP [facility] None    ----------------------------------------------------------------------------------------------------------------------  HPI 49 y.o. female  presents for 6 month follow up on hypertension, cholesterol,  prediabetes, morbid obesity and vitamin D deficiency.   She is a Dietitian.   She has intermittent recurrent sciatica sx; most recently on L; has seen Dr. Magnus Ivan, did steroid taper with improvement, also perceives benefit since switching from steel toed shoes to tennis shoes while at work. MRI is planned should her symptoms worsen again.   She has a history of asthma, hasn't had problems recently, may have some difficulty on very hot humid days. Uses PRN albuterol rarely.  She has had some GERD, back on nexium OTC night PRN, plans to taper soon.   BMI is There is no height or weight on file to calculate BMI., she has not been working on diet and exercise over the holiday. She has set a goal  Wt Readings from Last 3 Encounters:  09/08/19 202 lb (91.6 kg)  07/22/19 198 lb (89.8 kg)  10/19/18 201 lb 12.8 oz (91.5 kg)   ON HCTZ 25, ziac, norvasc, benazapril and potassium *** try switching HCTZ to spironolactone and stopping potassium supplement? *** Her blood pressure has been controlled at home (once a week, running 120s/75,etc), today their BP is    She does not workout. She denies chest pain, shortness of breath, dizziness.   She is on cholesterol medication (pravastatin 40 mg daily) and denies myalgias. Her cholesterol is at goal. The cholesterol last visit was:   Lab Results  Component Value Date   CHOL 168 07/22/2019   HDL 53 07/22/2019   LDLCALC 96 07/22/2019   TRIG 99 07/22/2019   CHOLHDL 3.2 07/22/2019    She has not been working on diet and exercise for prediabetes (on metformin 500 mg BID), and denies foot ulcerations, increased appetite,  nausea, paresthesia of the feet, polydipsia, polyuria, visual disturbances, vomiting and weight loss. Last A1C in the office was:  Lab Results  Component Value Date   HGBA1C 5.8 (H) 07/22/2019   Patient admits she has not started on Vitamin D supplement but is willing to do so:   Lab Results  Component Value  Date   VD25OH 12 (L) 07/22/2019     She has had mild persistent LFT elevation; negative hepatitis panel in 07/2019; no imaging *** Lab Results  Component Value Date   ALT 30 (H) 09/08/2019   AST 20 09/08/2019   ALKPHOS 39 06/10/2016   BILITOT 0.3 09/08/2019   She has hx of recurrent iron deficiency anemia suspected due to menorrhagia; negative hemoccult x 3 in 07/2019 She admitted poor tolerance of iron supplement; on review had done well via hematology ferrocite 324 mg tabs and has been taking BID *** Lab Results  Component Value Date   WBC 4.4 09/08/2019   HGB 10.5 (L) 09/08/2019   HCT 33.1 (L) 09/08/2019   MCV 78.3 (L) 09/08/2019   PLT 310 09/08/2019   Lab Results  Component Value Date   IRON 21 (L) 07/22/2019   TIBC 491 (H) 07/22/2019   FERRITIN 5 (L) 08/09/2019     Current Medications:  Current Outpatient Medications on File Prior to Visit  Medication Sig  . albuterol (VENTOLIN HFA) 108 (90 Base) MCG/ACT inhaler Inhale 2 puffs into the lungs every 4 (four) hours as needed for wheezing or shortness of breath.  Marland Kitchen amLODipine (NORVASC) 10 MG tablet Take 1 tablet every Morning for BP  . benazepril (LOTENSIN) 40 MG tablet Take 1 tab daily for blood pressure.  . bisoprolol-hydrochlorothiazide (ZIAC) 10-6.25 MG tablet TAKE 1 TABLET EVERY MORNING FOR BP  . Cholecalciferol ([employer]S D3) 125 MCG (5000 UT) capsule Take 1 capsule Daily for Vitamin  D Deficiency  . cyclobenzaprine (FLEXERIL) 5 MG tablet Take 1 tablet (5 mg total) by mouth 3 (three) times daily as needed for muscle spasms. May cause drowsiness, do not take if driving or with alcohol.  . Esomeprazole Magnesium (NEXIUM PO) Take 1 capsule by mouth daily. Taking OTC at night PRN  . Ferrous Fumarate (FERROCITE) 324 (106 Fe) MG TABS tablet Take 1 tablet 2 x /day for Anemia  . hydrochlorothiazide (HYDRODIURIL) 25 MG tablet TAKE 1 TABLET BY MOUTH DAILY FOR BP, FLUID RETENTION & ANKLE SWELLING  . ibuprofen (ADVIL,MOTRIN) 600 MG  tablet Take 1 tablet (600 mg total) by mouth every 6 (six) hours as needed.  . meloxicam (MOBIC) 15 MG tablet Take 1 tablet (15 mg total) by mouth daily.  . metFORMIN (GLUCOPHAGE-XR) 500 MG 24 hr tablet TAKE 1 TO 2 TABLETS BY MOUTH TWICE A DAY WITH MEALS  . methocarbamol (ROBAXIN) 500 MG tablet Take 1 tablet (500 mg total) by mouth every 6 (six) hours as needed for muscle spasms.  . methylPREDNISolone (MEDROL) 4 MG tablet Medrol dose pack. Take as instructed  . potassium chloride (KLOR-CON) 10 MEQ tablet TAKE 1 TABLET BY MOUTH DAILY FOR LOW POTASSIUM.  Marland Kitchen pravastatin (PRAVACHOL) 40 MG tablet TAKE 1 TABLET BY MOUTH AT BEDTIME FOR CHOLESTEROL   No current facility-administered medications on file prior to visit.     Allergies:  Allergies  Allergen Reactions  . Tussionex Pennkinetic Er [Hydrocod Polst-Cpm Polst Er]     causes headache     Medical History:  Past Medical History:  Diagnosis Date  . Allergic rhinitis   . Asthma   .  DM (diabetes mellitus) (HCC)    borderline  . High cholesterol   . Hypertension   . Iron deficiency anemia   . Low blood potassium    Family history- Reviewed and unchanged Social history- Reviewed and unchanged   Review of Systems:  Review of Systems  Constitutional: Negative for malaise/fatigue and weight loss.  HENT: Negative for hearing loss and tinnitus.   Eyes: Negative for blurred vision and double vision.  Respiratory: Negative for cough, shortness of breath and wheezing.   Cardiovascular: Negative for chest pain, palpitations, orthopnea, claudication and leg swelling.  Gastrointestinal: Negative for abdominal pain, blood in stool, constipation, diarrhea, heartburn, melena, nausea and vomiting.  Genitourinary: Negative.   Musculoskeletal: Negative for joint pain and myalgias.  Skin: Negative for rash.  Neurological: Negative for dizziness, tingling, sensory change, weakness and headaches.  Endo/Heme/Allergies: Negative for polydipsia.   Psychiatric/Behavioral: Negative.   All other systems reviewed and are negative.    Physical Exam: There were no vitals taken for this visit. Wt Readings from Last 3 Encounters:  09/08/19 202 lb (91.6 kg)  07/22/19 198 lb (89.8 kg)  10/19/18 201 lb 12.8 oz (91.5 kg)   General Appearance: Well nourished, in no apparent distress. Eyes: PERRLA, EOMs, conjunctiva no swelling or erythema Sinuses: No Frontal/maxillary tenderness ENT/Mouth: Ext aud canals clear, TMs without erythema, bulging. No erythema, swelling, or exudate on post pharynx.  Tonsils not swollen or erythematous. Hearing normal.  Neck: Supple, thyroid normal.  Respiratory: Respiratory effort normal, BS equal bilaterally without rales, rhonchi, wheezing or stridor.  Cardio: RRR with no MRGs. Brisk peripheral pulses without edema.  Abdomen: Soft, + BS.  Non tender, no guarding, rebound, hernias, masses. Lymphatics: Non tender without lymphadenopathy.  Musculoskeletal: Full ROM, 5/5 strength, Normal gait Skin: Warm, dry without rashes, lesions, ecchymosis.  Neuro: Cranial nerves intact. No cerebellar symptoms.  Psych: Awake and oriented X 3, normal affect, Insight and Judgment appropriate.    Dan Maker, NP 1:48 PM [facility]

## 2020-03-22 ENCOUNTER — Other Ambulatory Visit: Payer: Self-pay | Admitting: Internal Medicine

## 2020-06-23 ENCOUNTER — Other Ambulatory Visit: Payer: Self-pay | Admitting: Adult Health

## 2020-06-23 ENCOUNTER — Other Ambulatory Visit: Payer: Self-pay | Admitting: Internal Medicine

## 2020-06-23 DIAGNOSIS — I10 Essential (primary) hypertension: Secondary | ICD-10-CM

## 2020-06-23 DIAGNOSIS — E785 Hyperlipidemia, unspecified: Secondary | ICD-10-CM

## 2020-07-14 NOTE — Progress Notes (Addendum)
Complete Physical  Assessment and Plan:  Encounter for general adult medical examination with abnormal findings 49 years  Essential hypertension  - continue medications, DASH diet, exercise and monitor at home. Call if greater than 130/80.  - consider switch HCTZ to dyazide if hypokalemia -     CBC with Differential/Platelet -     CMP/GFR -     TSH -     Urinalysis, Routine w reflex microscopic -     Microalbumin / creatinine urine ratio -     EKG 12-Lead  Morbid obesity (HCC) - BMI 39 with htn, hyperlipidemia, prediabetes - long discussion about weight loss, diet, and exercise -recommended diet heavy in fruits and veggies and low in animal meats, cheeses, and dairy products -     TSH  Mixed hyperlipidemia -continue medications, check lipids, decrease fatty foods, increase activity.  -     Lipid panel  Prediabetes Controlled on metformin  Discussed disease and risks Discussed diet/exercise, weight management  A1C  Medication management -     Magnesium  Menorrhagia with regular cycle Follows with GYN Screen iron deficiency anemia  Iron deficiency anemia, unspecified iron deficiency anemia type Reports history of similar with neg GI workup, was referred to hematology Likely secondary to menorrhagia Check CBC, iron She was taking iron supplement but has stopped -     Iron,Total/Total Iron Binding Cap -     CBC -     Hemoccult   Vitamin D deficiency -     VITAMIN D 25 Hydroxy (Vit-D Deficiency, Fractures)  Uncomplicated asthma, unspecified asthma severity, unspecified whether persistent Well controlled, albuterol PRN but rarely uses, avoid triggers  Elevated LFTs Mild, persistent; normal hepatitis 07/2019 [place] if remains elevated today  Recommend decrease alcohol, decrease tylenol, and encouraged weight loss.   Sciatica, recurrent Ortho is following, currently well controlled  Orders Placed This Encounter  Procedures  . CBC with Differential/Platelet  .  COMPLETE METABOLIC PANEL WITH GFR  . Magnesium  . Lipid panel  . TSH  . Hemoglobin A1c  . VITAMIN D 25 Hydroxy (Vit-D Deficiency, Fractures)  . Iron, TIBC and Ferritin Panel  . Microalbumin / creatinine urine ratio  . Urinalysis, Routine w reflex microscopic  . Ambulatory referral to Gastroenterology  . EKG 12-Lead    Discussed med's effects and SE's. Screening labs and tests as requested with regular follow-up as recommended. May schedule sooner if indicated by lab results.   Future Appointments  Date Time Provider Department Center  07/25/2021  9:00 AM Judd Gaudier, NP [facility] None    HPI  49 y.o. AA female  presents for a complete physical. She has Iron deficiency anemia; Essential hypertension; Mixed hyperlipidemia; Other abnormal glucose (prediabetes) ; Menorrhagia with regular cycle; Vitamin D deficiency; Medication management; Hypokalemia; Asthma; Morbid obesity (HCC); Elevated LFTs; and Left sided sciatica on their problem list.   She has 4 kids, 5 grandkids. Single and happy. She works as a Location manager.   She follows with GYN, Dr. Juliene Pina, [facility].   She has had bil sciatica, referred to Dr. Magnus Ivan and had rassuring xrays, did meloxicam, muscle relaxers and reports sx much improved/resolved.   She has a history of asthma, hasn't had problems recently, may have some difficulty on very hot humid days.   She has had some GERD, back on nexium OTC night PRN, plans to taper soon.   BMI is Body mass index is 38.08 kg/m., she has not been working on diet and exercise. She  works physically active job 5 days a week, on her feet with lots of lifting. She reports has reduced soda, down to 0-1/2 bottle. No alcohol.  Wt Readings from Last 3 Encounters:  07/24/20 195 lb (88.5 kg)  09/08/19 202 lb (91.6 kg)  07/22/19 198 lb (89.8 kg)   Her blood pressure has been controlled at home, today their BP is BP: 130/82.  She does not workout. She denies chest pain, shortness  of breath, dizziness. Echo 2014, mild LVH, normal EF 55-60%.  She is on cholesterol medication (pravastatin 40 mg daily) and denies myalgias. Her cholesterol is at goal. The cholesterol last visit was:  Lab Results  Component Value Date   CHOL 168 07/22/2019   HDL 53 07/22/2019   LDLCALC 96 07/22/2019   TRIG 99 07/22/2019   CHOLHDL 3.2 07/22/2019  . She has been working on diet and exercise for prediabetes which has been controlled with oral metformin 500 mg BID, she is not on bASA, she is on ACE/ARB and denies foot ulcerations, hyperglycemia, hypoglycemia , increased appetite, nausea, paresthesia of the feet, polydipsia, polyuria, visual disturbances, vomiting and weight loss. Last A1C in the office was:  Lab Results  Component Value Date   HGBA1C 5.8 (H) 07/22/2019   Lab Results  Component Value Date   GFRAA 127 09/08/2019   Patient is on Vitamin D supplement, has not been taking it in the last 2-3 weeks  Lab Results  Component Value Date   VD25OH 12 (L) 07/22/2019     She has iron deficiency anemia, was on iron supplement but stopped; she reports has had similar event with iron deficiency in the past, saw GI Dr. Arlyce Dice with reportedly normal workup, ended up seeing hematology. She did have negative hemoccult x 3 on 08/10/2019, was initiated on ferrous fumarate 324 mg BID, unfortunately never presented for recheck, reports stopped taking after 3 months or so. She endorses ongoing heavy menstrual cycles, unchanged.  Lab Results  Component Value Date   WBC 4.4 09/08/2019   HGB 10.5 (L) 09/08/2019   HCT 33.1 (L) 09/08/2019   MCV 78.3 (L) 09/08/2019   PLT 310 09/08/2019   Lab Results  Component Value Date   IRON 21 (L) 07/22/2019   TIBC 491 (H) 07/22/2019   FERRITIN 5 (L) 08/09/2019   Lab Results  Component Value Date   RETICCTPCT 3.1 08/09/2019     Current Medications:  Current Outpatient Medications on File Prior to Visit  Medication Sig Dispense Refill  . albuterol  (VENTOLIN HFA) 108 (90 Base) MCG/ACT inhaler Inhale 2 puffs into the lungs every 4 (four) hours as needed for wheezing or shortness of breath. 1 Inhaler 0  . amLODipine (NORVASC) 10 MG tablet TAKE 1 TABLET BY MOUTH EVERY MORNING FOR BLOOD PRESSURE 90 tablet 0  . benazepril (LOTENSIN) 40 MG tablet TAKE 1 TABLET BY MOUTH DAILY FOR BLOOD PRESSURE 90 tablet 0  . cyclobenzaprine (FLEXERIL) 5 MG tablet Take 1 tablet (5 mg total) by mouth 3 (three) times daily as needed for muscle spasms. May cause drowsiness, do not take if driving or with alcohol. 60 tablet 0  . Esomeprazole Magnesium (NEXIUM PO) Take 1 capsule by mouth daily. Taking OTC at night PRN    . hydrochlorothiazide (HYDRODIURIL) 25 MG tablet TAKE 1 TABLET BY MOUTH DAILY FOR BP, FLUID RETENTION & ANKLE SWELLING 90 tablet 1  . ibuprofen (ADVIL,MOTRIN) 600 MG tablet Take 1 tablet (600 mg total) by mouth every 6 (six) hours as  needed. 30 tablet 0  . meloxicam (MOBIC) 15 MG tablet Take 1 tablet (15 mg total) by mouth daily. (Patient taking differently: Take 15 mg by mouth as needed.) 30 tablet 1  . methocarbamol (ROBAXIN) 500 MG tablet Take 1 tablet (500 mg total) by mouth every 6 (six) hours as needed for muscle spasms. 40 tablet 1  . pravastatin (PRAVACHOL) 40 MG tablet TAKE 1 TABLET BY MOUTH AT BEDTIME FOR CHOLESTEROL 90 tablet 0  . Cholecalciferol (CVS D3) 125 MCG (5000 UT) capsule Take 1 capsule Daily for Vitamin  D Deficiency (Patient not taking: Reported on 07/24/2020) 100 capsule 1  . Ferrous Fumarate (FERROCITE) 324 (106 Fe) MG TABS tablet Take 1 tablet 2 x /day for Anemia (Patient not taking: Reported on 07/24/2020) 200 tablet 0  . potassium chloride (KLOR-CON) 10 MEQ tablet TAKE 1 TABLET BY MOUTH DAILY FOR LOW POTASSIUM. (Patient not taking: Reported on 07/24/2020) 90 tablet 0   No current facility-administered medications on file prior to visit.    Health Maintenance:   Immunization History  Administered Date(s) Administered  .  Influenza Split 06/09/2015  . Influenza, Seasonal, Injecte, Preservative Fre 06/10/2016  . Influenza-Unspecified 05/26/2018, 05/13/2019, 05/12/2020  . Moderna Sars-Covid-2 Vaccination 11/01/2019, 11/29/2019  . Pneumococcal-Unspecified 08/13/2007  . Td 11/10/2005, 06/10/2016   Tetanus: 2017 Pneumovax: 2009 Flu vaccine: 05/2020 Covid 19: 2/2, 2021, moderna   Pap 07/2018, sees GYN regularly gets there, due this month, pt states will schedule this month or next month MGM: 01/2020, bil diagnostic for recall was benign  Colonoscopy: patient states insurance will cover this year if needed - GI referral placed   Last Eye Exam: Dr. ?, got early 2019, wears glasses, will schedule in the next 2 weeks  Last Dental Exam: [facility], last 2019, will schedule   Patient Care Team: Lucky CowboyMcKeown, William, MD as PCP - General (Internal Medicine) Shea EvansMody, Vaishali, MD as Consulting Physician (Obstetrics and Gynecology) Louis MeckelKaplan, Robert D, MD (Inactive) as Consulting Physician (Gastroenterology) Reece PackerLivesay, Lennis P, MD as Consulting Physician (Oncology) Kathleene HazelMcAlhany, Christopher D, MD as Consulting Physician (Cardiology)  Medical History:  Past Medical History:  Diagnosis Date  . Allergic rhinitis   . Asthma   . DM (diabetes mellitus) (HCC)    borderline  . High cholesterol   . Hypertension   . Iron deficiency anemia   . Low blood potassium    Allergies Allergies  Allergen Reactions  . Tussionex Pennkinetic Er [Hydrocod Polst-Cpm Polst Er]     causes headache    SURGICAL HISTORY She  has a past surgical history that includes Cesarean section; Umbilical hernia repair (1977); and Tubal ligation (1996). FAMILY HISTORY Her family history includes Breast cancer in her maternal aunt; Diabetes in her father, maternal aunt, and mother; Heart disease in her maternal grandmother; Mitral valve prolapse in her mother; Pancreatic cancer in her maternal uncle; Stroke in her mother. SOCIAL HISTORY She  reports  that she has never smoked. She has never used smokeless tobacco. She reports that she does not drink alcohol and does not use drugs.  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  HENT: Negative for congestion, ear pain and sore throat.   Eyes: Negative.   Respiratory: Negative for cough, shortness of breath and wheezing.   Cardiovascular: Negative for chest pain, palpitations and leg swelling.  Gastrointestinal: Negative for abdominal pain, blood in stool, constipation,  diarrhea, heartburn and melena.  Genitourinary: Negative.   Musculoskeletal: Negative for back pain, falls and joint pain.  Skin: Negative.   Neurological: Negative for dizziness, sensory change, loss of consciousness and headaches.  Psychiatric/Behavioral: Negative for depression. The patient is not nervous/anxious and does not have insomnia.     Physical Exam: Estimated body mass index is 38.08 kg/m as calculated from the following:   Height as of this encounter: 5' (1.524 m).   Weight as of this encounter: 195 lb (88.5 kg). BP 130/82   Pulse (!) 58   Temp (!) 97.3 F (36.3 C)   Ht 5' (1.524 m)   Wt 195 lb (88.5 kg)   SpO2 96%   BMI 38.08 kg/m   General Appearance: Well nourished well developed, obese female, in no apparent distress.  Eyes: PERRLA, EOMs, conjunctiva no swelling or erythema ENT/Mouth: Ear canals normal without obstruction, swelling, erythema, or discharge.  TMs normal bilaterally with no erythema, bulging, retraction, or loss of landmark.  Oropharynx moist and clear with no exudate, erythema, or swelling.   Neck: Supple, thyroid normal. No bruits.  No cervical adenopathy Respiratory: Respiratory effort normal, Breath sounds clear A&P without wheeze, rhonchi, rales.   Cardio: RRR without murmurs, rubs or gallops. Brisk peripheral pulses without edema.   Chest: symmetric, with normal excursions Breasts: defer to GYN Abdomen: Soft, nontender, no guarding, rebound, hernias, masses, or organomegaly.  Lymphatics: Non tender without lymphadenopathy.  Musculoskeletal: Full ROM all peripheral extremities,5/5 strength, and normal gait.  Skin: Warm, dry without rashes, lesions, ecchymosis. Neuro: Awake and oriented X 3, Cranial nerves intact, reflexes equal bilaterally. Normal muscle tone, no cerebellar symptoms. Sensation intact.  Psych:  normal affect, Insight and Judgment appropriate.  GU: defer to GYN  EKG: Sinus arrhythmia  Over 40 minutes of exam, counseling, chart review and critical decision making was performed  Dan Maker 9:45 AM [facility]

## 2020-07-20 ENCOUNTER — Other Ambulatory Visit: Payer: Self-pay | Admitting: Adult Health

## 2020-07-24 ENCOUNTER — Ambulatory Visit (INDEPENDENT_AMBULATORY_CARE_PROVIDER_SITE_OTHER): Payer: BC Managed Care – PPO | Admitting: Adult Health

## 2020-07-24 ENCOUNTER — Other Ambulatory Visit: Payer: Self-pay

## 2020-07-24 ENCOUNTER — Encounter: Payer: Self-pay | Admitting: Adult Health

## 2020-07-24 VITALS — BP 130/82 | HR 58 | Temp 97.3°F | Ht 60.0 in | Wt 195.0 lb

## 2020-07-24 DIAGNOSIS — Z1389 Encounter for screening for other disorder: Secondary | ICD-10-CM | POA: Diagnosis not present

## 2020-07-24 DIAGNOSIS — N920 Excessive and frequent menstruation with regular cycle: Secondary | ICD-10-CM

## 2020-07-24 DIAGNOSIS — Z79899 Other long term (current) drug therapy: Secondary | ICD-10-CM | POA: Diagnosis not present

## 2020-07-24 DIAGNOSIS — Z130 Encounter for screening for diseases of the blood and blood-forming organs and certain disorders involving the immune mechanism: Secondary | ICD-10-CM | POA: Diagnosis not present

## 2020-07-24 DIAGNOSIS — Z131 Encounter for screening for diabetes mellitus: Secondary | ICD-10-CM

## 2020-07-24 DIAGNOSIS — Z1211 Encounter for screening for malignant neoplasm of colon: Secondary | ICD-10-CM

## 2020-07-24 DIAGNOSIS — R7309 Other abnormal glucose: Secondary | ICD-10-CM

## 2020-07-24 DIAGNOSIS — I10 Essential (primary) hypertension: Secondary | ICD-10-CM | POA: Diagnosis not present

## 2020-07-24 DIAGNOSIS — J4520 Mild intermittent asthma, uncomplicated: Secondary | ICD-10-CM

## 2020-07-24 DIAGNOSIS — E876 Hypokalemia: Secondary | ICD-10-CM

## 2020-07-24 DIAGNOSIS — Z1329 Encounter for screening for other suspected endocrine disorder: Secondary | ICD-10-CM | POA: Diagnosis not present

## 2020-07-24 DIAGNOSIS — Z0001 Encounter for general adult medical examination with abnormal findings: Secondary | ICD-10-CM

## 2020-07-24 DIAGNOSIS — E559 Vitamin D deficiency, unspecified: Secondary | ICD-10-CM | POA: Diagnosis not present

## 2020-07-24 DIAGNOSIS — R7989 Other specified abnormal findings of blood chemistry: Secondary | ICD-10-CM

## 2020-07-24 DIAGNOSIS — Z0000 Encounter for general adult medical examination without abnormal findings: Secondary | ICD-10-CM | POA: Diagnosis not present

## 2020-07-24 DIAGNOSIS — D509 Iron deficiency anemia, unspecified: Secondary | ICD-10-CM

## 2020-07-24 DIAGNOSIS — E782 Mixed hyperlipidemia: Secondary | ICD-10-CM

## 2020-07-24 DIAGNOSIS — Z13220 Encounter for screening for lipoid disorders: Secondary | ICD-10-CM | POA: Diagnosis not present

## 2020-07-24 DIAGNOSIS — Z136 Encounter for screening for cardiovascular disorders: Secondary | ICD-10-CM | POA: Diagnosis not present

## 2020-07-24 MED ORDER — CVS D3 125 MCG (5000 UT) PO CAPS: ORAL_CAPSULE | ORAL | 1 refills | Status: DC

## 2020-07-24 MED ORDER — METFORMIN HCL ER 500 MG PO TB24: ORAL_TABLET | ORAL | 1 refills | Status: DC

## 2020-07-24 MED ORDER — BISOPROLOL FUMARATE 10 MG PO TABS: 10.0000 mg | ORAL_TABLET | Freq: Every day | ORAL | 1 refills | Status: DC

## 2020-07-24 MED ORDER — OMEPRAZOLE 20 MG PO CPDR: 20.0000 mg | DELAYED_RELEASE_CAPSULE | Freq: Every evening | ORAL | 1 refills | Status: DC | PRN

## 2020-07-24 NOTE — Patient Instructions (Addendum)
  Alexis Marquez , Thank you for taking time to come for your Annual Wellness Visit. I appreciate your ongoing commitment to your health goals. Please review the following plan we discussed and let me know if I can assist you in the future.   These are the goals we discussed: Goals    . DIET - EAT MORE FRUITS AND VEGETABLES     5-7+ servings daily     . DIET - INCREASE WATER INTAKE     65+ fluid ounces daily     . DIET - REDUCE FAST FOOD INTAKE     Try to make better choices when eating out - limit fried foods, choose side salad, fruit, baked over fried, etc.     . Weight (lb) < 190 lb (86.2 kg)       This is a list of the screening recommended for you and due dates:  Health Maintenance  Topic Date Due  . Pap Smear  07/12/2020  . Tetanus Vaccine  06/10/2026  . Flu Shot  Completed  . COVID-19 Vaccine  Completed  .  Hepatitis C: One time screening is recommended by Center for Disease Control  (CDC) for  adults born from [birth year] through 1964.   Completed  . HIV Screening  Discontinued      Know what a healthy weight is for you (roughly BMI <25) and aim to maintain this  Aim for 7+ servings of fruits and vegetables daily  65-80+ fluid ounces of water or unsweet tea for healthy kidneys  Limit to max 1 drink of alcohol per day; avoid smoking/tobacco  Limit animal fats in diet for cholesterol and heart health - choose grass fed whenever available  Avoid highly processed foods, and foods high in saturated/trans fats  Aim for low stress - take time to unwind and care for your mental health  Aim for 150 min of moderate intensity exercise weekly for heart health, and weights twice weekly for bone health  Aim for 7-9 hours of sleep daily       A great goal to work towards is aiming to get in a serving daily of some of the most nutritionally dense foods - G- BOMBS daily

## 2020-07-25 ENCOUNTER — Other Ambulatory Visit: Payer: Self-pay | Admitting: Adult Health

## 2020-07-25 DIAGNOSIS — D509 Iron deficiency anemia, unspecified: Secondary | ICD-10-CM

## 2020-07-25 DIAGNOSIS — R7989 Other specified abnormal findings of blood chemistry: Secondary | ICD-10-CM

## 2020-07-25 LAB — COMPLETE METABOLIC PANEL WITH GFR
AG Ratio: 1.2 (calc) (ref 1.0–2.5)
ALT: 43 U/L — ABNORMAL HIGH (ref 6–29)
AST: 27 U/L (ref 10–35)
Albumin: 4.2 g/dL (ref 3.6–5.1)
Alkaline phosphatase (APISO): 54 U/L (ref 31–125)
BUN/Creatinine Ratio: 9 (calc) (ref 6–22)
BUN: 6 mg/dL — ABNORMAL LOW (ref 7–25)
CO2: 26 mmol/L (ref 20–32)
Calcium: 9.7 mg/dL (ref 8.6–10.2)
Chloride: 102 mmol/L (ref 98–110)
Creat: 0.68 mg/dL (ref 0.50–1.10)
GFR, Est African American: 120 mL/min/{1.73_m2} (ref >=60)
GFR, Est Non African American: 103 mL/min/{1.73_m2} (ref >=60)
Globulin: 3.5 g/dL (calc) (ref 1.9–3.7)
Glucose, Bld: 96 mg/dL (ref 65–99)
Potassium: 3.7 mmol/L (ref 3.5–5.3)
Sodium: 139 mmol/L (ref 135–146)
Total Bilirubin: 0.4 mg/dL (ref 0.2–1.2)
Total Protein: 7.7 g/dL (ref 6.1–8.1)

## 2020-07-25 LAB — MAGNESIUM: Magnesium: 2 mg/dL (ref 1.5–2.5)

## 2020-07-25 LAB — VITAMIN D 25 HYDROXY (VIT D DEFICIENCY, FRACTURES): Vit D, 25-Hydroxy: 31 ng/mL (ref 30–100)

## 2020-07-25 LAB — URINALYSIS, ROUTINE W REFLEX MICROSCOPIC
Bilirubin Urine: NEGATIVE
Glucose, UA: NEGATIVE
Hgb urine dipstick: NEGATIVE
Ketones, ur: NEGATIVE
Leukocytes,Ua: NEGATIVE
Nitrite: NEGATIVE
Protein, ur: NEGATIVE
Specific Gravity, Urine: 1.011 (ref 1.001–1.03)
pH: <=5 (ref 5.0–8.0)

## 2020-07-25 LAB — LIPID PANEL
Cholesterol: 209 mg/dL — ABNORMAL HIGH (ref <200)
HDL: 53 mg/dL (ref >=50)
LDL Cholesterol (Calc): 137 mg/dL (calc) — ABNORMAL HIGH
Non-HDL Cholesterol (Calc): 156 mg/dL (calc) — ABNORMAL HIGH (ref <130)
Total CHOL/HDL Ratio: 3.9 (calc) (ref <5.0)
Triglycerides: 86 mg/dL (ref <150)

## 2020-07-25 LAB — CBC WITH DIFFERENTIAL/PLATELET
Absolute Monocytes: 348 cells/uL (ref 200–950)
Basophils Absolute: 29 cells/uL (ref 0–200)
Basophils Relative: 0.6 %
Eosinophils Absolute: 118 cells/uL (ref 15–500)
Eosinophils Relative: 2.4 %
HCT: 32.7 % — ABNORMAL LOW (ref 35.0–45.0)
Hemoglobin: 10.1 g/dL — ABNORMAL LOW (ref 11.7–15.5)
Lymphs Abs: 2058 cells/uL (ref 850–3900)
MCH: 24.5 pg — ABNORMAL LOW (ref 27.0–33.0)
MCHC: 30.9 g/dL — ABNORMAL LOW (ref 32.0–36.0)
MCV: 79.2 fL — ABNORMAL LOW (ref 80.0–100.0)
MPV: 10.1 fL (ref 7.5–12.5)
Monocytes Relative: 7.1 %
Neutro Abs: 2347 cells/uL (ref 1500–7800)
Neutrophils Relative %: 47.9 %
Platelets: 352 10*3/uL (ref 140–400)
RBC: 4.13 10*6/uL (ref 3.80–5.10)
RDW: 13.3 % (ref 11.0–15.0)
Total Lymphocyte: 42 %
WBC: 4.9 10*3/uL (ref 3.8–10.8)

## 2020-07-25 LAB — IRON,TIBC AND FERRITIN PANEL
%SAT: 5 % (calc) — ABNORMAL LOW (ref 16–45)
Ferritin: 6 ng/mL — ABNORMAL LOW (ref 16–232)
Iron: 24 ug/dL — ABNORMAL LOW (ref 40–190)
TIBC: 468 mcg/dL (calc) — ABNORMAL HIGH (ref 250–450)

## 2020-07-25 LAB — HEMOGLOBIN A1C
Hgb A1c MFr Bld: 6 % of total Hgb — ABNORMAL HIGH (ref <5.7)
Mean Plasma Glucose: 126 mg/dL
eAG (mmol/L): 7 mmol/L

## 2020-07-25 LAB — MICROALBUMIN / CREATININE URINE RATIO
Creatinine, Urine: 40 mg/dL (ref 20–275)
Microalb Creat Ratio: 10 mcg/mg creat (ref <30)
Microalb, Ur: 0.4 mg/dL

## 2020-07-25 LAB — TSH: TSH: 0.88 mIU/L

## 2020-07-25 MED ORDER — FERROUS FUMARATE 324 (106 FE) MG PO TABS: ORAL_TABLET | ORAL | 0 refills | Status: DC

## 2020-08-24 ENCOUNTER — Encounter: Payer: Self-pay | Admitting: Adult Health

## 2020-08-24 ENCOUNTER — Ambulatory Visit
Admission: RE | Admit: 2020-08-24 | Discharge: 2020-08-24 | Disposition: A | Discharge status: Home / Self Care | Payer: BC Managed Care – PPO | Source: Ambulatory Visit | Attending: Adult Health | Admitting: Adult Health

## 2020-08-24 DIAGNOSIS — R7989 Other specified abnormal findings of blood chemistry: Secondary | ICD-10-CM

## 2020-08-24 DIAGNOSIS — K760 Fatty (change of) liver, not elsewhere classified: Secondary | ICD-10-CM | POA: Diagnosis not present

## 2020-08-24 DIAGNOSIS — K7689 Other specified diseases of liver: Secondary | ICD-10-CM | POA: Diagnosis not present

## 2020-10-13 ENCOUNTER — Other Ambulatory Visit: Payer: Self-pay | Admitting: Adult Health

## 2020-10-13 DIAGNOSIS — E785 Hyperlipidemia, unspecified: Secondary | ICD-10-CM

## 2020-10-14 ENCOUNTER — Other Ambulatory Visit: Payer: Self-pay | Admitting: Adult Health

## 2020-10-14 DIAGNOSIS — I10 Essential (primary) hypertension: Secondary | ICD-10-CM

## 2020-10-30 ENCOUNTER — Other Ambulatory Visit: Payer: Self-pay | Admitting: Adult Health

## 2020-10-30 DIAGNOSIS — D509 Iron deficiency anemia, unspecified: Secondary | ICD-10-CM

## 2020-11-03 ENCOUNTER — Other Ambulatory Visit: Payer: Self-pay | Admitting: Adult Health Nurse Practitioner

## 2020-11-03 ENCOUNTER — Other Ambulatory Visit: Payer: Self-pay | Admitting: Adult Health

## 2020-11-03 ENCOUNTER — Other Ambulatory Visit: Payer: Self-pay | Admitting: Internal Medicine

## 2020-11-03 DIAGNOSIS — I10 Essential (primary) hypertension: Secondary | ICD-10-CM

## 2020-11-23 ENCOUNTER — Ambulatory Visit: Payer: BC Managed Care – PPO | Admitting: Adult Health

## 2021-01-18 ENCOUNTER — Encounter: Payer: Self-pay | Admitting: Adult Health

## 2021-01-18 ENCOUNTER — Ambulatory Visit (INDEPENDENT_AMBULATORY_CARE_PROVIDER_SITE_OTHER): Payer: BC Managed Care – PPO | Admitting: Adult Health

## 2021-01-18 ENCOUNTER — Other Ambulatory Visit: Payer: Self-pay

## 2021-01-18 VITALS — BP 122/80 | HR 65 | Temp 97.5°F | Wt 196.0 lb

## 2021-01-18 DIAGNOSIS — R7309 Other abnormal glucose: Secondary | ICD-10-CM | POA: Diagnosis not present

## 2021-01-18 DIAGNOSIS — K760 Fatty (change of) liver, not elsewhere classified: Secondary | ICD-10-CM | POA: Diagnosis not present

## 2021-01-18 DIAGNOSIS — I10 Essential (primary) hypertension: Secondary | ICD-10-CM

## 2021-01-18 DIAGNOSIS — Z79899 Other long term (current) drug therapy: Secondary | ICD-10-CM

## 2021-01-18 DIAGNOSIS — E876 Hypokalemia: Secondary | ICD-10-CM

## 2021-01-18 DIAGNOSIS — E782 Mixed hyperlipidemia: Secondary | ICD-10-CM | POA: Diagnosis not present

## 2021-01-18 DIAGNOSIS — D509 Iron deficiency anemia, unspecified: Secondary | ICD-10-CM | POA: Diagnosis not present

## 2021-01-18 DIAGNOSIS — E559 Vitamin D deficiency, unspecified: Secondary | ICD-10-CM

## 2021-01-18 DIAGNOSIS — J4520 Mild intermittent asthma, uncomplicated: Secondary | ICD-10-CM

## 2021-01-18 MED ORDER — FAMOTIDINE 20 MG PO TABS: ORAL_TABLET | ORAL | 3 refills | Status: DC

## 2021-01-18 MED ORDER — MELOXICAM 15 MG PO TABS: ORAL_TABLET | ORAL | 1 refills | Status: DC

## 2021-01-18 NOTE — Patient Instructions (Addendum)
Goals      DIET - EAT MORE FRUITS AND VEGETABLES     5+ 1/2 cup servings daily  Whole grains  Beans       DIET - REDUCE FAST FOOD INTAKE     Weight (lb) < 185 lb (83.9 kg)       Recommend reaching out to Dr. Arlyce Dice about colonoscopy - guidelines changed, insurance should be covering after age 49.  Phone: [phone]    Sciatica Rehab Ask your health care provider which exercises are safe for you. Do exercises exactly as told by your health care provider and adjust them as directed. It is normal to feel mild stretching, pulling, tightness, or discomfort as you do these exercises. Stop right away if you feel sudden pain or your pain gets worse. Do not begin these exercises until told by your health care provider. Stretching and range-of-motion exercises These exercises warm up your muscles and joints and improve the movement and flexibility of your hips and back. These exercises also help to relieve pain, numbness, and tingling. Sciatic nerve glide Sit in a chair with your head facing down toward your chest. Place your hands behind your back. Let your shoulders slump forward. Slowly straighten one of your legs while you tilt your head back as if you are looking toward the ceiling. Only straighten your leg as far as you can without making your symptoms worse. Hold this position for __________ seconds. Slowly return to the starting position. Repeat with your other leg. Repeat __________ times. Complete this exercise __________ times a day. Knee to chest with hip adduction and internal rotation Lie on your back on a firm surface with both legs straight. Bend one of your knees and move it up toward your chest until you feel a gentle stretch in your lower back and buttock. Then, move your knee toward the shoulder that is on the opposite side from your leg. This is hip adduction and internal rotation. Hold your leg in this position by holding on to the front of your knee. Hold this  position for __________ seconds. Slowly return to the starting position. Repeat with your other leg. Repeat __________ times. Complete this exercise __________ times a day.   Prone extension on elbows Lie on your abdomen on a firm surface. A bed may be too soft for this exercise. Prop yourself up on your elbows. Use your arms to help lift your chest up until you feel a gentle stretch in your abdomen and your lower back. This will place some of your body weight on your elbows. If this is uncomfortable, try stacking pillows under your chest. Your hips should stay down, against the surface that you are lying on. Keep your hip and back muscles relaxed. Hold this position for __________ seconds. Slowly relax your upper body and return to the starting position. Repeat __________ times. Complete this exercise __________ times a day.   Strengthening exercises These exercises build strength and endurance in your back. Endurance is the ability to use your muscles for a long time, even after they get tired. Pelvic tilt This exercise strengthens the muscles that lie deep in the abdomen. Lie on your back on a firm surface. Bend your knees and keep your feet flat on the floor. Tense your abdominal muscles. Tip your pelvis up toward the ceiling and flatten your lower back into the floor. To help with this exercise, you may place a small towel under your lower back and try to push your  back into the towel. Hold this position for __________ seconds. Let your muscles relax completely before you repeat this exercise. Repeat __________ times. Complete this exercise __________ times a day. Alternating arm and leg raises Get on your hands and knees on a firm surface. If you are on a hard floor, you may want to use padding, such as an exercise mat, to cushion your knees. Line up your arms and legs. Your hands should be directly below your shoulders, and your knees should be directly below your hips. Lift your  left leg behind you. At the same time, raise your right arm and straighten it in front of you. Do not lift your leg higher than your hip. Do not lift your arm higher than your shoulder. Keep your abdominal and back muscles tight. Keep your hips facing the ground. Do not arch your back. Keep your balance carefully, and do not hold your breath. Hold this position for __________ seconds. Slowly return to the starting position. Repeat with your right leg and your left arm. Repeat __________ times. Complete this exercise __________ times a day.   Posture and body mechanics Good posture and healthy body mechanics can help to relieve stress in your body's tissues and joints. Body mechanics refers to the movements and positions of your body while you do your daily activities. Posture is part of body mechanics. Good posture means: Your spine is in its natural S-curve position (neutral). Your shoulders are pulled back slightly. Your head is not tipped forward. Follow these guidelines to improve your posture and body mechanics in your everyday activities. Standing When standing, keep your spine neutral and your feet about hip width apart. Keep a slight bend in your knees. Your ears, shoulders, and hips should line up. When you do a task in which you stand in one place for a long time, place one foot up on a stable object that is 2-4 inches (5-10 cm) high, such as a footstool. This helps keep your spine neutral.   Sitting When sitting, keep your spine neutral and keep your feet flat on the floor. Use a footrest, if necessary, and keep your thighs parallel to the floor. Avoid rounding your shoulders, and avoid tilting your head forward. When working at a desk or a computer, keep your desk at a height where your hands are slightly lower than your elbows. Slide your chair under your desk so you are close enough to maintain good posture. When working at a computer, place your monitor at a height where you are  looking straight ahead and you do not have to tilt your head forward or downward to look at the screen.   Resting When lying down and resting, avoid positions that are most painful for you. If you have pain with activities such as sitting, bending, stooping, or squatting, lie in a position in which your body does not bend very much. For example, avoid curling up on your side with your arms and knees near your chest (fetal position). If you have pain with activities such as standing for a long time or reaching with your arms, lie with your spine in a neutral position and bend your knees slightly. Try the following positions: Lying on your side with a pillow between your knees. Lying on your back with a pillow under your knees. Lifting When lifting objects, keep your feet at least shoulder width apart and tighten your abdominal muscles. Bend your knees and hips and keep your spine neutral. It is  important to lift using the strength of your legs, not your back. Do not lock your knees straight out. Always ask for help to lift heavy or awkward objects.   This information is not intended to replace advice given to you by your health care provider. Make sure you discuss any questions you have with your health care provider. Document Revised: 11/20/2018 Document Reviewed: 08/20/2018 Elsevier Patient Education  2021 [employer].

## 2021-01-18 NOTE — Progress Notes (Signed)
49 MONTH FOLLOW P  Assessment and Plan:   Essential hypertension  - continue medications, DASH diet, exercise and monitor at home. Call if greater than 130/80.  - consider switch HCTZ to dyazide if hypokalemia -     CBC with Differential/Platelet -     CMP/GFR -     TSH  Morbid obesity (HCC) - BMI 35+ with htn, hyperlipidemia, prediabetes - long discussion about weight loss, diet, and exercise -recommended diet heavy in fruits and veggies and low in animal meats, cheeses, and dairy products - goal set to reduce fast food, eat more beans, 10 lb weight loss for next OV -     TSH  Mixed hyperlipidemia -LDL goal <100; plan to switch to rosuvastatin -check lipids, decrease fatty foods, increase activity.  -     Lipid panel  Prediabetes Controlled on metformin  Discussed disease and risks Discussed diet/exercise, weight management  A1C  Medication management -     Magnesium  Iron deficiency anemia, unspecified iron deficiency anemia type Reports history of similar with neg GI workup, was referred to hematology Likely secondary to menorrhagia Check CBC, iron She has been taking supplement more regularly  -     Iron, Total/Total Iron Binding Cap -     CBC   Vitamin D deficiency -     VITAMIN D 25 Hydroxy (Vit-D Deficiency, Fractures)   Discussed med's effects and SE's. Labs and tests as requested with regular follow-up as recommended. 30 min spent with patient reviewing chart, counseling, examining and moderate decision making.   Future Appointments  Date Time Provider Department Center  03/26/2021  8:45 AM Judd Gaudier, NP [facility] None  07/25/2021  9:00 AM Judd Gaudier, NP [facility] None    HPI  50 y.o. AA female  presents for 3 month follow up for htn, lipids, prediabetes, morbid obesity, vitamin D def.   She has had bil sciatica, referred to Dr. Magnus Ivan and had rassuring xrays, did meloxicam, muscle relaxers and reports sx much improved/resolved. Has mild  intermittent R knee discomfort, manages with rest and ice.   She has a history of asthma, hasn't had problems recently, may have some difficulty on very hot humid days.   She has had some GERD, back on nexium OTC doing well alternating with famotidine   BMI is Body mass index is 38.28 kg/m., she has not been working on diet and exercise. She works physically active job 5 days a week, on her feet with lots of lifting. She reports has reduced soda, down to 0-1/2 bottle. No alcohol. Does admit to fast food, ~3 days per week.  Hepatic steatosis per [place] 08/2020.  Wt Readings from Last 3 Encounters:  01/18/21 196 lb (88.9 kg)  07/24/20 195 lb (88.5 kg)  09/08/19 202 lb (91.6 kg)   Her blood pressure has been controlled at home, today their BP is BP: 122/80.  She does not workout. She denies chest pain, shortness of breath, dizziness. Echo 2014, mild LVH, normal EF 55-60%.  She is on cholesterol medication (pravastatin 40 mg daily) and denies myalgias. Her cholesterol is at goal. The cholesterol last visit was:  Lab Results  Component Value Date   CHOL 209 (H) 07/24/2020   HDL 53 07/24/2020   LDLCALC 137 (H) 07/24/2020   TRIG 86 07/24/2020   CHOLHDL 3.9 07/24/2020  . She has been working on diet and exercise for prediabetes which has been controlled with oral metformin 500 mg BID, she is not on bASA,  she is on ACE/ARB and denies foot ulcerations, hyperglycemia, hypoglycemia , increased appetite, nausea, paresthesia of the feet, polydipsia, polyuria, visual disturbances, vomiting and weight loss. Last A1C in the office was:  Lab Results  Component Value Date   HGBA1C 6.0 (H) 07/24/2020   Lab Results  Component Value Date   GFRAA 120 07/24/2020   Patient is on Vitamin D supplement, has been taking more regularly, 5000 IU ?  Lab Results  Component Value Date   VD25OH 31 07/24/2020     She has iron deficiency anemia, was on iron supplement but stopped; she reports has had similar event  with iron deficiency in the past, saw GI Dr. Arlyce Dice with reportedly normal workup, ended up seeing hematology. She did have negative hemoccult x 3 on 08/10/2019, was initiated on ferrous fumarate 324 mg BID. She endorses ongoing heavy menstrual cycles, unchanged. Hasn't had screening colonoscopy yet due to insurance.  Lab Results  Component Value Date   WBC 4.9 07/24/2020   HGB 10.1 (L) 07/24/2020   HCT 32.7 (L) 07/24/2020   MCV 79.2 (L) 07/24/2020   PLT 352 07/24/2020   Lab Results  Component Value Date   IRON 24 (L) 07/24/2020   TIBC 468 (H) 07/24/2020   FERRITIN 6 (L) 07/24/2020     Current Medications:  Current Outpatient Medications on File Prior to Visit  Medication Sig Dispense Refill   albuterol (VENTOLIN HFA) 108 (90 Base) MCG/ACT inhaler Inhale 2 puffs into the lungs every 4 (four) hours as needed for wheezing or shortness of breath. 1 Inhaler 0   amLODipine (NORVASC) 10 MG tablet TAKE 1 TABLET BY MOUTH EVERY MORNING FOR BLOOD PRESSURE 90 tablet 1   benazepril (LOTENSIN) 40 MG tablet TAKE 1 TABLET BY MOUTH DAILY FOR BLOOD PRESSURE 90 tablet 1   bisoprolol (ZEBETA) 10 MG tablet TAKE 1 TABLET BY MOUTH EVERY DAY 90 tablet 1   Cholecalciferol ([employer] D3) 125 MCG (5000 UT) capsule Take 1 capsule Daily for Vitamin  D Deficiency 90 capsule 1   Esomeprazole Magnesium (NEXIUM PO) Take 1 capsule by mouth daily. Taking OTC at night PRN     Ferrous Fumarate (FERROCITE) 324 (106 Fe) MG TABS tablet TAKE 1 TABLET BY MOUTH TWICE A DAY FOR ANEMIA 200 tablet 2   hydrochlorothiazide (HYDRODIURIL) 25 MG tablet TAKE 1 TABLET BY MOUTH DAILY FOR BP, FLUID RETENTION & ANKLE SWELLING 90 tablet 1   ibuprofen (ADVIL,MOTRIN) 600 MG tablet Take 1 tablet (600 mg total) by mouth every 6 (six) hours as needed. 30 tablet 0   metFORMIN (GLUCOPHAGE-XR) 500 MG 24 hr tablet Take 1 tab twice daily for sugars and weight loss. 180 tablet 1   pravastatin (PRAVACHOL) 40 MG tablet TAKE 1 TABLET BY MOUTH AT BEDTIME FOR  CHOLESTEROL 90 tablet 1   cyclobenzaprine (FLEXERIL) 5 MG tablet Take 1 tablet (5 mg total) by mouth 3 (three) times daily as needed for muscle spasms. May cause drowsiness, do not take if driving or with alcohol. (Patient not taking: Reported on 01/18/2021) 60 tablet 0   methocarbamol (ROBAXIN) 500 MG tablet Take 1 tablet (500 mg total) by mouth every 6 (six) hours as needed for muscle spasms. (Patient not taking: Reported on 01/18/2021) 40 tablet 1   No current facility-administered medications on file prior to visit.    Medical History:  Past Medical History:  Diagnosis Date   Allergic rhinitis    Asthma    DM (diabetes mellitus) (HCC)    borderline  High cholesterol    Hypertension    Iron deficiency anemia    Low blood potassium    Allergies Allergies  Allergen Reactions   Tussionex Pennkinetic Er [Hydrocod Polst-Cpm Polst Er]     causes headache    SURGICAL HISTORY She  has a past surgical history that includes Cesarean section; Umbilical hernia repair (1977); and Tubal ligation (1996). FAMILY HISTORY Her family history includes Breast cancer in her maternal aunt; Diabetes in her father, maternal aunt, and mother; Heart disease in her maternal grandmother; Mitral valve prolapse in her mother; Pancreatic cancer in her maternal uncle; Stroke in her mother. SOCIAL HISTORY She  reports that she has never smoked. She has never used smokeless tobacco. She reports that she does not drink alcohol and does not use drugs.   Review of Systems:  Review of Systems  Constitutional:  Negative for chills, fever and malaise/fatigue.  HENT:  Negative for congestion, ear pain and sore throat.   Eyes: Negative.   Respiratory:  Negative for cough, shortness of breath and wheezing.   Cardiovascular:  Negative for chest pain, palpitations and leg swelling.  Gastrointestinal:  Negative for abdominal pain, blood in stool, constipation, diarrhea, heartburn and melena.  Genitourinary: Negative.    Musculoskeletal:  Positive for joint pain (R knee, mild intermittent). Negative for back pain and falls.  Skin: Negative.   Neurological:  Negative for dizziness, sensory change, loss of consciousness and headaches.  Psychiatric/Behavioral:  Negative for depression. The patient is not nervous/anxious and does not have insomnia.    Physical Exam: Estimated body mass index is 38.28 kg/m as calculated from the following:   Height as of 07/24/20: 5' (1.524 m).   Weight as of this encounter: 196 lb (88.9 kg). BP 122/80   Pulse 65   Temp (!) 97.5 F (36.4 C)   Wt 196 lb (88.9 kg)   SpO2 99%   BMI 38.28 kg/m   General Appearance: Well nourished well developed, obese AA female, in no apparent distress.  Eyes: PERRLA, EOMs, conjunctiva no swelling or erythema ENT/Mouth: Ear canals normal without obstruction, swelling, erythema, or discharge.  TMs normal bilaterally with no erythema, bulging, retraction, or loss of landmark.  Oropharynx moist and clear with no exudate, erythema, or swelling.   Neck: Supple, thyroid normal. No bruits.  No cervical adenopathy Respiratory: Respiratory effort normal, Breath sounds clear A&P without wheeze, rhonchi, rales.   Cardio: RRR without murmurs, rubs or gallops. Brisk peripheral pulses without edema.  Abdomen: Soft, nontender, no guarding, rebound, hernias, masses, or organomegaly.  Lymphatics: Non tender without lymphadenopathy.  Musculoskeletal: Full ROM all peripheral extremities,5/5 strength, and normal gait. No effusion, crepitus, laxity.  Skin: Warm, dry without rashes, lesions, ecchymosis. Neuro: Awake and oriented X 3, Cranial nerves intact, reflexes equal bilaterally. Normal muscle tone, no cerebellar symptoms. Sensation intact.  Psych:  normal affect, Insight and Judgment appropriate.    Dan Maker 4:36 PM [facility]

## 2021-01-19 ENCOUNTER — Other Ambulatory Visit: Payer: Self-pay | Admitting: Adult Health

## 2021-01-19 DIAGNOSIS — D509 Iron deficiency anemia, unspecified: Secondary | ICD-10-CM

## 2021-01-19 LAB — CBC WITH DIFFERENTIAL/PLATELET
Absolute Monocytes: 598 cells/uL (ref 200–950)
Basophils Absolute: 39 cells/uL (ref 0–200)
Basophils Relative: 0.6 %
Eosinophils Absolute: 156 cells/uL (ref 15–500)
Eosinophils Relative: 2.4 %
HCT: 36.4 % (ref 35.0–45.0)
Hemoglobin: 11.6 g/dL — ABNORMAL LOW (ref 11.7–15.5)
Lymphs Abs: 2503 cells/uL (ref 850–3900)
MCH: 26.8 pg — ABNORMAL LOW (ref 27.0–33.0)
MCHC: 31.9 g/dL — ABNORMAL LOW (ref 32.0–36.0)
MCV: 84.1 fL (ref 80.0–100.0)
MPV: 10.1 fL (ref 7.5–12.5)
Monocytes Relative: 9.2 %
Neutro Abs: 3205 cells/uL (ref 1500–7800)
Neutrophils Relative %: 49.3 %
Platelets: 280 10*3/uL (ref 140–400)
RBC: 4.33 10*6/uL (ref 3.80–5.10)
RDW: 12.6 % (ref 11.0–15.0)
Total Lymphocyte: 38.5 %
WBC: 6.5 10*3/uL (ref 3.8–10.8)

## 2021-01-19 LAB — COMPLETE METABOLIC PANEL WITH GFR
AG Ratio: 1.2 (calc) (ref 1.0–2.5)
ALT: 31 U/L — ABNORMAL HIGH (ref 6–29)
AST: 18 U/L (ref 10–35)
Albumin: 4.1 g/dL (ref 3.6–5.1)
Alkaline phosphatase (APISO): 49 U/L (ref 31–125)
BUN: 10 mg/dL (ref 7–25)
CO2: 28 mmol/L (ref 20–32)
Calcium: 9.7 mg/dL (ref 8.6–10.2)
Chloride: 101 mmol/L (ref 98–110)
Creat: 0.65 mg/dL (ref 0.50–1.10)
GFR, Est African American: 121 mL/min/{1.73_m2} (ref >=60)
GFR, Est Non African American: 104 mL/min/{1.73_m2} (ref >=60)
Globulin: 3.3 g/dL (calc) (ref 1.9–3.7)
Glucose, Bld: 96 mg/dL (ref 65–99)
Potassium: 3.7 mmol/L (ref 3.5–5.3)
Sodium: 137 mmol/L (ref 135–146)
Total Bilirubin: 0.4 mg/dL (ref 0.2–1.2)
Total Protein: 7.4 g/dL (ref 6.1–8.1)

## 2021-01-19 LAB — LIPID PANEL
Cholesterol: 168 mg/dL (ref <200)
HDL: 46 mg/dL — ABNORMAL LOW (ref >=50)
LDL Cholesterol (Calc): 101 mg/dL (calc) — ABNORMAL HIGH
Non-HDL Cholesterol (Calc): 122 mg/dL (calc) (ref <130)
Total CHOL/HDL Ratio: 3.7 (calc) (ref <5.0)
Triglycerides: 109 mg/dL (ref <150)

## 2021-01-19 LAB — HEMOGLOBIN A1C
Hgb A1c MFr Bld: 5.3 % of total Hgb (ref <5.7)
Mean Plasma Glucose: 105 mg/dL
eAG (mmol/L): 5.8 mmol/L

## 2021-01-19 LAB — VITAMIN D 25 HYDROXY (VIT D DEFICIENCY, FRACTURES): Vit D, 25-Hydroxy: 57 ng/mL (ref 30–100)

## 2021-01-19 LAB — IRON,TIBC AND FERRITIN PANEL
%SAT: 21 % (calc) (ref 16–45)
Ferritin: 26 ng/mL (ref 16–232)
Iron: 73 ug/dL (ref 40–190)
TIBC: 356 mcg/dL (calc) (ref 250–450)

## 2021-01-19 LAB — MAGNESIUM: Magnesium: 2 mg/dL (ref 1.5–2.5)

## 2021-01-19 LAB — TSH: TSH: 1.17 mIU/L

## 2021-01-19 MED ORDER — FERROUS FUMARATE 324 (106 FE) MG PO TABS: ORAL_TABLET | ORAL | 1 refills | Status: AC

## 2021-03-26 ENCOUNTER — Ambulatory Visit: Payer: BC Managed Care – PPO | Admitting: Adult Health

## 2021-03-26 DIAGNOSIS — E559 Vitamin D deficiency, unspecified: Secondary | ICD-10-CM

## 2021-03-26 DIAGNOSIS — I10 Essential (primary) hypertension: Secondary | ICD-10-CM

## 2021-03-26 DIAGNOSIS — E782 Mixed hyperlipidemia: Secondary | ICD-10-CM

## 2021-03-26 DIAGNOSIS — R7309 Other abnormal glucose: Secondary | ICD-10-CM

## 2021-03-26 DIAGNOSIS — E876 Hypokalemia: Secondary | ICD-10-CM

## 2021-03-26 DIAGNOSIS — Z79899 Other long term (current) drug therapy: Secondary | ICD-10-CM

## 2021-03-26 DIAGNOSIS — D509 Iron deficiency anemia, unspecified: Secondary | ICD-10-CM

## 2021-04-20 ENCOUNTER — Other Ambulatory Visit: Payer: Self-pay | Admitting: Adult Health

## 2021-04-21 ENCOUNTER — Other Ambulatory Visit: Payer: Self-pay | Admitting: Adult Health

## 2021-04-21 MED ORDER — BISOPROLOL FUMARATE 10 MG PO TABS: 10.0000 mg | ORAL_TABLET | Freq: Every day | ORAL | 1 refills | Status: AC

## 2021-05-07 ENCOUNTER — Other Ambulatory Visit: Payer: Self-pay | Admitting: Adult Health

## 2021-05-07 DIAGNOSIS — I10 Essential (primary) hypertension: Secondary | ICD-10-CM

## 2021-05-13 ENCOUNTER — Other Ambulatory Visit: Payer: Self-pay | Admitting: Adult Health Nurse Practitioner

## 2021-07-24 NOTE — Progress Notes (Signed)
Complete Physical  Assessment and Plan:  Encounter for Annual Physical Exam with abnormal findings Due annually  Health Maintenance reviewed Healthy lifestyle reviewed and goals set  Essential hypertension  - continue medications, DASH diet, exercise and monitor at home. Call if greater than 130/80.  - consider switch HCTZ to dyazide if hypokalemia  -     CBC with Differential/Platelet -     CMP/GFR -     TSH -     Urinalysis, Routine w reflex microscopic -     Microalbumin / creatinine urine ratio -     EKG 12-Lead  Morbid obesity (HCC) - BMI 38 with htn, hyperlipidemia, prediabetes - long discussion about weight loss, diet, and exercise -recommended diet heavy in fruits and veggies and low in animal meats, cheeses, and dairy products - weight next weight goal <185 lb, limit processed carbs, encouraged high fiber whole foods, weigh weekly to monitor -     TSH  Mixed hyperlipidemia -continue medications, check lipids, decrease fatty foods, increase activity.  -     Lipid panel  Prediabetes Controlled on metformin  Discussed disease and risks Discussed diet/exercise, weight management  A1C  Medication management -     Magnesium  Menorrhagia with regular cycle Follows with GYN, improving per patient  Iron deficiency anemia, unspecified iron deficiency anemia type Reports history of similar with neg GI workup, was referred to hematology Likely secondary to menorrhagia Check CBC, iron - reduce iron as able, anticipate tapering off after menopause -     Iron, Total/Total Iron Binding Cap, Ferritin -     CBC  Vitamin D deficiency -     VITAMIN D 25 Hydroxy (Vit-D Deficiency, Fractures) - defer, restart supplement  Uncomplicated asthma, unspecified asthma severity, unspecified whether persistent Well controlled, albuterol PRN but rarely uses, avoid triggers  Fatty liver -  Mild, persistent; normal hepatitis 07/2019 Recommend decrease alcohol, decrease tylenol, and  encouraged weight loss.   Sciatica, recurrent Ortho is following, having flare, refill meds, sciatica exercises given  Colon cancer screening - GI referral placed after discussion.   Orders Placed This Encounter  Procedures   CBC with Differential/Platelet   COMPLETE METABOLIC PANEL WITH GFR   Magnesium   Lipid panel   TSH   Hemoglobin A1c   Iron, TIBC and Ferritin Panel   Microalbumin / creatinine urine ratio   Urinalysis, Routine w reflex microscopic   Ambulatory referral to Gastroenterology   EKG 12-Lead     Discussed med's effects and SE's. Screening labs and tests as requested with regular follow-up as recommended. May schedule sooner if indicated by lab results.   Future Appointments  Date Time Provider Department Center  07/25/2022  9:00 AM Judd Gaudier, NP [facility] None    HPI  50 y.o. AA female  presents for a complete physical. She has Iron deficiency anemia; Essential hypertension; Mixed hyperlipidemia; Other abnormal glucose (prediabetes) ; Menorrhagia with regular cycle; Vitamin D deficiency; Medication management; Hypokalemia; Asthma; Morbid obesity (HCC); Hepatic steatosis; and Left sided sciatica on their problem list.   She has 4 kids, 6 grandkids. Single and happy. She works as a Location manager.   She follows with GYN, Dr. Juliene Pina, [facility].   She has had bil sciatica, referred to Dr. Magnus Ivan and had rassuring xrays, did meloxicam, muscle relaxers. Was much better but having more sx recently in the morning, improves once she starts moving. Has mild intermittent R knee discomfort, manages with rest and ice.   She has  a history of asthma, hasn't had problems recently, may have some difficulty on very hot humid days.   She has had some GERD, now on famotidine 20 mg, having some breakthrough with once daily, will having some burning with trigger foods.   BMI is Body mass index is 38.47 kg/m., she has not been working on diet and exercise.  She  works physically active job 5 days a week, on her feet with lots of lifting. She reports has reduced soda, down to 0-1/2 bottle, tries to drink mostly water. No alcohol. Does admit to fast food, ~3 days per week.  Hepatic steatosis per [place] 08/2020.  Wt Readings from Last 3 Encounters:  07/25/21 197 lb (89.4 kg)  01/18/21 196 lb (88.9 kg)  07/24/20 195 lb (88.5 kg)   Her blood pressure has been controlled at home (130s/80s), today their BP is BP: 138/84.  She does not workout. She denies chest pain, shortness of breath, dizziness. Echo 2014, mild LVH, normal EF 55-60%.  She is on cholesterol medication (pravastatin 40 mg daily) and denies myalgias. Her cholesterol is at goal. The cholesterol last visit was:  Lab Results  Component Value Date   CHOL 168 01/18/2021   HDL 46 (L) 01/18/2021   LDLCALC 101 (H) 01/18/2021   TRIG 109 01/18/2021   CHOLHDL 3.7 01/18/2021  . She has been working on diet and exercise for prediabetes which has been controlled with oral metformin 500 mg BID, she is not on bASA, she is on ACE/ARB and denies foot ulcerations, hyperglycemia, hypoglycemia , increased appetite, nausea, paresthesia of the feet, polydipsia, polyuria, visual disturbances, vomiting and weight loss. Last A1C in the office was:  Lab Results  Component Value Date   HGBA1C 5.3 01/18/2021   Lab Results  Component Value Date   GFRAA 121 01/18/2021   Patient is on Vitamin D supplement, has not been taking it in the last 2-3 weeks  Lab Results  Component Value Date   VD25OH 35 01/18/2021     She has iron deficiency anemia, was on iron supplement but stopped; she reports has had similar event with iron deficiency in the past, saw GI Dr. Arlyce Dice with reportedly normal workup, ended up seeing hematology. She did have negative hemoccult x 3 on 08/10/2019, was initiated on ferrous fumarate 324 mg BID, improved and reduced to once daily.  She endorses ongoing heavy menstrual cycles, unchanged.  Lab  Results  Component Value Date   WBC 6.5 01/18/2021   HGB 11.6 (L) 01/18/2021   HCT 36.4 01/18/2021   MCV 84.1 01/18/2021   PLT 280 01/18/2021   Lab Results  Component Value Date   IRON 73 01/18/2021   TIBC 356 01/18/2021   FERRITIN 26 01/18/2021      Current Medications:  Current Outpatient Medications on File Prior to Visit  Medication Sig Dispense Refill   albuterol (VENTOLIN HFA) 108 (90 Base) MCG/ACT inhaler Inhale 2 puffs into the lungs every 4 (four) hours as needed for wheezing or shortness of breath. 1 Inhaler 0   amLODipine (NORVASC) 10 MG tablet TAKE 1 TABLET BY MOUTH EVERY MORNING FOR BLOOD PRESSURE 90 tablet 1   benazepril (LOTENSIN) 40 MG tablet TAKE 1 TABLET BY MOUTH DAILY FOR BLOOD PRESSURE 90 tablet 1   bisoprolol (ZEBETA) 10 MG tablet Take 1 tablet (10 mg total) by mouth daily. 90 tablet 1   Esomeprazole Magnesium (NEXIUM PO) Take 1 capsule by mouth daily. Taking OTC at night PRN  Ferrous Fumarate (FERROCITE) 324 (106 Fe) MG TABS tablet TAKE 1 TABLET DAILY FOR ANEMIA 90 tablet 1   hydrochlorothiazide (HYDRODIURIL) 25 MG tablet TAKE 1 TABLET BY MOUTH DAILY FOR BP, FLUID RETENTION & ANKLE SWELLING 90 tablet 1   ibuprofen (ADVIL,MOTRIN) 600 MG tablet Take 1 tablet (600 mg total) by mouth every 6 (six) hours as needed. 30 tablet 0   metFORMIN (GLUCOPHAGE-XR) 500 MG 24 hr tablet Take  2 tablets  2 x /day  with Meals for Diabetes         /TAKE 1 TO 2 TABLETS BY MOUTH TWICE A DAY WITH MEALS 360 tablet 3   pravastatin (PRAVACHOL) 40 MG tablet TAKE 1 TABLET BY MOUTH AT BEDTIME FOR CHOLESTEROL 90 tablet 1   No current facility-administered medications on file prior to visit.    Health Maintenance:   Immunization History  Administered Date(s) Administered   Influenza Split 06/09/2015   Influenza, Seasonal, Injecte, Preservative Fre 06/10/2016   Influenza-Unspecified 05/26/2018, 05/13/2019, 05/12/2020, 04/25/2021   Moderna SARS-COV2 Booster Vaccination 08/10/2020    Moderna Sars-Covid-2 Vaccination 11/01/2019, 11/29/2019   Pneumococcal-Unspecified 08/13/2007   Td 11/10/2005, 06/10/2016   Tetanus: 2017 Pneumovax: 2009 Flu vaccine: 04/2021 Covid 19: 2/2, 2021, moderna + booster  Pap 07/2018, sees GYN regularly gets there, due this month, pt states will schedule this month or next month MGM: 01/2020, bil diagnostic for recall was benign, will schedule   Colonoscopy: patient states insurance will cover this year if needed - GI referral placed - she never scheduled- referral back   Last Eye Exam: [facility], got early 2020, wears glasses, goes every 2 years, due Last Dental Exam: [facility], last 2019, will schedule   Patient Care Team: Lucky Cowboy, MD as PCP - General (Internal Medicine) Shea Evans, MD as Consulting Physician (Obstetrics and Gynecology) Louis Meckel, MD (Inactive) as Consulting Physician (Gastroenterology) Reece Packer, MD as Consulting Physician (Oncology) Kathleene Hazel, MD as Consulting Physician (Cardiology)  Medical History:  Past Medical History:  Diagnosis Date   Allergic rhinitis    Asthma    DM (diabetes mellitus) (HCC)    borderline   High cholesterol    Hypertension    Iron deficiency anemia    Low blood potassium    Allergies Allergies  Allergen Reactions   Tussionex Pennkinetic Er [Hydrocod Polst-Cpm Polst Er]     causes headache    SURGICAL HISTORY She  has a past surgical history that includes Cesarean section; Umbilical hernia repair (1977); and Tubal ligation (1996). FAMILY HISTORY Her family history includes Breast cancer in her maternal aunt; Diabetes in her father, maternal aunt, and mother; Heart disease in her maternal grandmother; Mitral valve prolapse in her mother; Pancreatic cancer in her maternal uncle; Stroke in her mother. SOCIAL HISTORY She  reports that she has never smoked. She has never used smokeless tobacco. She reports that she does not drink  alcohol and does not use drugs.  Can't take off July- Sept, works at [employer] that [employer], visas  Review of Systems:  Review of Systems  Constitutional:  Negative for chills, fever and malaise/fatigue.  HENT:  Negative for congestion, ear pain and sore throat.   Eyes: Negative.   Respiratory:  Negative for cough, shortness of breath and wheezing.   Cardiovascular:  Negative for chest pain, palpitations and leg swelling.  Gastrointestinal:  Negative for abdominal pain, blood in stool, constipation, diarrhea, heartburn and melena.  Genitourinary: Negative.   Musculoskeletal:  Negative for back pain, falls and joint pain.  Skin: Negative.   Neurological:  Negative for dizziness, sensory change, loss of consciousness and headaches.  Psychiatric/Behavioral:  Negative for depression. The patient is not nervous/anxious and does not have insomnia.    Physical Exam: Estimated body mass index is 38.47 kg/m as calculated from the following:   Height as of this encounter: 5' (1.524 m).   Weight as of this encounter: 197 lb (89.4 kg). BP 138/84   Pulse 66   Temp (!) 97.3 F (36.3 C)   Ht 5' (1.524 m)   Wt 197 lb (89.4 kg)   SpO2 98%   BMI 38.47 kg/m   General Appearance: Well nourished well developed, obese female, in no apparent distress.  Eyes: PERRLA, EOMs, conjunctiva no swelling or erythema ENT/Mouth: Ear canals normal without obstruction, swelling, erythema, or discharge.  TMs normal bilaterally with no erythema, bulging, retraction, or loss of landmark.  Oropharynx moist and clear with no exudate, erythema, or swelling.   Neck: Supple, thyroid normal. No bruits.  No cervical adenopathy Respiratory: Respiratory effort normal, Breath sounds clear A&P without wheeze, rhonchi, rales.   Cardio: RRR without murmurs, rubs or gallops. Brisk peripheral pulses without edema.  Chest: symmetric, with normal excursions Breasts: declines, will follow up  GYN/mammogram Abdomen: Soft, nontender, no guarding, rebound, hernias, masses, or organomegaly.  Lymphatics: Non tender without lymphadenopathy.  Musculoskeletal: Full ROM all peripheral extremities,5/5 strength, and normal gait.  Skin: Warm, dry without rashes, lesions, ecchymosis. Neuro: Awake and oriented X 3, Cranial nerves intact, reflexes equal bilaterally. Normal muscle tone, no cerebellar symptoms. Sensation intact.  Psych:  normal affect, Insight and Judgment appropriate.  GU: defer to GYN  EKG: Sinus bradycardia, rage 56  Over 40 minutes of exam, counseling, chart review and critical decision making was performed  Dan Maker, NP 9:28 AM [facility]

## 2021-07-25 ENCOUNTER — Encounter: Payer: Self-pay | Admitting: Adult Health

## 2021-07-25 ENCOUNTER — Ambulatory Visit (INDEPENDENT_AMBULATORY_CARE_PROVIDER_SITE_OTHER): Payer: BC Managed Care – PPO | Admitting: Adult Health

## 2021-07-25 ENCOUNTER — Other Ambulatory Visit: Payer: Self-pay

## 2021-07-25 VITALS — BP 138/84 | HR 66 | Temp 97.3°F | Ht 60.0 in | Wt 197.0 lb

## 2021-07-25 DIAGNOSIS — E782 Mixed hyperlipidemia: Secondary | ICD-10-CM

## 2021-07-25 DIAGNOSIS — Z1329 Encounter for screening for other suspected endocrine disorder: Secondary | ICD-10-CM

## 2021-07-25 DIAGNOSIS — D509 Iron deficiency anemia, unspecified: Secondary | ICD-10-CM

## 2021-07-25 DIAGNOSIS — Z136 Encounter for screening for cardiovascular disorders: Secondary | ICD-10-CM | POA: Diagnosis not present

## 2021-07-25 DIAGNOSIS — Z131 Encounter for screening for diabetes mellitus: Secondary | ICD-10-CM

## 2021-07-25 DIAGNOSIS — Z0001 Encounter for general adult medical examination with abnormal findings: Secondary | ICD-10-CM

## 2021-07-25 DIAGNOSIS — J4520 Mild intermittent asthma, uncomplicated: Secondary | ICD-10-CM

## 2021-07-25 DIAGNOSIS — I10 Essential (primary) hypertension: Secondary | ICD-10-CM | POA: Diagnosis not present

## 2021-07-25 DIAGNOSIS — Z130 Encounter for screening for diseases of the blood and blood-forming organs and certain disorders involving the immune mechanism: Secondary | ICD-10-CM | POA: Diagnosis not present

## 2021-07-25 DIAGNOSIS — E559 Vitamin D deficiency, unspecified: Secondary | ICD-10-CM

## 2021-07-25 DIAGNOSIS — N920 Excessive and frequent menstruation with regular cycle: Secondary | ICD-10-CM

## 2021-07-25 DIAGNOSIS — Z79899 Other long term (current) drug therapy: Secondary | ICD-10-CM | POA: Diagnosis not present

## 2021-07-25 DIAGNOSIS — K760 Fatty (change of) liver, not elsewhere classified: Secondary | ICD-10-CM

## 2021-07-25 DIAGNOSIS — Z0000 Encounter for general adult medical examination without abnormal findings: Secondary | ICD-10-CM | POA: Diagnosis not present

## 2021-07-25 DIAGNOSIS — Z13220 Encounter for screening for lipoid disorders: Secondary | ICD-10-CM | POA: Diagnosis not present

## 2021-07-25 DIAGNOSIS — R7309 Other abnormal glucose: Secondary | ICD-10-CM

## 2021-07-25 DIAGNOSIS — Z1389 Encounter for screening for other disorder: Secondary | ICD-10-CM | POA: Diagnosis not present

## 2021-07-25 DIAGNOSIS — Z1211 Encounter for screening for malignant neoplasm of colon: Secondary | ICD-10-CM

## 2021-07-25 MED ORDER — FAMOTIDINE 20 MG PO TABS: ORAL_TABLET | ORAL | 3 refills | Status: AC

## 2021-07-25 MED ORDER — METHOCARBAMOL 500 MG PO TABS: 500.0000 mg | ORAL_TABLET | Freq: Four times a day (QID) | ORAL | 1 refills | Status: AC | PRN

## 2021-07-25 MED ORDER — MELOXICAM 15 MG PO TABS: ORAL_TABLET | ORAL | 1 refills | Status: AC

## 2021-07-25 MED ORDER — CVS D3 125 MCG (5000 UT) PO CAPS
ORAL_CAPSULE | ORAL | 3 refills | Status: AC
Start: 2021-07-25 — End: ?

## 2021-07-25 NOTE — Patient Instructions (Addendum)
Alexis Marquez , Thank you for taking time to come for your Annual Wellness Visit. I appreciate your ongoing commitment to your health goals. Please review the following plan we discussed and let me know if I can assist you in the future.   These are the goals we discussed:  Goals      Blood Pressure < 130/80     DIET - EAT MORE FRUITS AND VEGETABLES     5+ 1/2 cup servings daily  Whole grains  Beans      DIET - REDUCE FAST FOOD INTAKE     Weight (lb) < 185 lb (83.9 kg)        This is a list of the screening recommended for you and due dates:  Health Maintenance  Topic Date Due   Colon Cancer Screening  Never done   Pap Smear  07/12/2020   COVID-19 Vaccine (3 - Moderna risk series) 08/10/2021*   Pneumococcal Vaccination (1 - PCV) 07/25/2045*   Tetanus Vaccine  06/10/2026   Flu Shot  Completed   Hepatitis C Screening: USPSTF Recommendation to screen - Ages 18-79 yo.  Completed   HPV Vaccine  Aged Out   HIV Screening  Discontinued  *Topic was postponed. The date shown is not the original due date.   Recommend scheduling dental exam, PAP smear, mammogram and eye exam  Please call or message if you don't hear about scheduling your colonoscopy by the end of January 2023     Know what a healthy weight is for you (roughly BMI <25) and aim to maintain this  Aim for 7+ servings of fruits and vegetables daily - at least 25-30 g of fiber daily   65-80+ fluid ounces of water or unsweet tea for healthy kidneys  Limit to max 1 drink of alcohol per day; avoid smoking/tobacco  Limit animal fats in diet for cholesterol and heart health - choose grass fed whenever available  Avoid highly processed foods, and foods high in saturated/trans fats  Aim for low stress - take time to unwind and care for your mental health  Aim for 150 min of moderate intensity exercise weekly for heart health, and weights twice weekly for bone health  Aim for 7-9 hours of sleep  daily      High-Fiber Eating Plan Fiber, also called dietary fiber, is a type of carbohydrate. It is found foods such as fruits, vegetables, whole grains, and beans. A high-fiber diet can have many health benefits. Your health care provider may recommend a high-fiber diet to help: Prevent constipation. Fiber can make your bowel movements more regular. Lower your cholesterol. Relieve the following conditions: Inflammation of veins in the anus (hemorrhoids). Inflammation of specific areas of the digestive tract (uncomplicated diverticulosis). A problem of the large intestine, also called the colon, that sometimes causes pain and diarrhea (irritable bowel syndrome, or IBS). Prevent overeating as part of a weight-loss plan. Prevent heart disease, type 2 diabetes, and certain cancers. What are tips for following this plan? Reading food labels  Check the nutrition facts label on food products for the amount of dietary fiber. Choose foods that have 5 grams of fiber or more per serving. The goals for recommended daily fiber intake include: Men (age 54 or younger): 34-38 g. Men (over age 32): 28-34 g. Women (age 10 or younger): 25-28 g. Women (over age 64): 22-25 g. Your daily fiber goal is _____________ g. Shopping Choose whole fruits and vegetables instead of processed forms, such as apple  juice or applesauce. Choose a wide variety of high-fiber foods such as avocados, lentils, oats, and kidney beans. Read the nutrition facts label of the foods you choose. Be aware of foods with added fiber. These foods often have high sugar and sodium amounts per serving. Cooking Use whole-grain flour for baking and cooking. Cook with brown rice instead of white rice. Meal planning Start the day with a breakfast that is high in fiber, such as a cereal that contains 5 g of fiber or more per serving. Eat breads and cereals that are made with whole-grain flour instead of refined flour or white flour. Eat  brown rice, bulgur wheat, or millet instead of white rice. Use beans in place of meat in soups, salads, and pasta dishes. Be sure that half of the grains you eat each day are whole grains. General information You can get the recommended daily intake of dietary fiber by: Eating a variety of fruits, vegetables, grains, nuts, and beans. Taking a fiber supplement if you are not able to take in enough fiber in your diet. It is better to get fiber through food than from a supplement. Gradually increase how much fiber you consume. If you increase your intake of dietary fiber too quickly, you may have bloating, cramping, or gas. Drink plenty of water to help you digest fiber. Choose high-fiber snacks, such as berries, raw vegetables, nuts, and popcorn. What foods should I eat? Fruits Berries. Pears. Apples. Oranges. Avocado. Prunes and raisins. Dried figs. Vegetables Sweet potatoes. Spinach. Kale. Artichokes. Cabbage. Broccoli. Cauliflower. Green peas. Carrots. Squash. Grains Whole-grain breads. Multigrain cereal. Oats and oatmeal. Brown rice. Barley. Bulgur wheat. Millet. Quinoa. Bran muffins. Popcorn. Rye wafer crackers. Meats and other proteins Navy beans, kidney beans, and pinto beans. Soybeans. Split peas. Lentils. Nuts and seeds. Dairy Fiber-fortified yogurt. Beverages Fiber-fortified soy milk. Fiber-fortified orange juice. Other foods Fiber bars. The items listed above may not be a complete list of recommended foods and beverages. Contact a dietitian for more information. What foods should I avoid? Fruits Fruit juice. Cooked, strained fruit. Vegetables Fried potatoes. Canned vegetables. Well-cooked vegetables. Grains White bread. Pasta made with refined flour. White rice. Meats and other proteins Fatty cuts of meat. Fried chicken or fried fish. Dairy Milk. Yogurt. Cream cheese. Sour cream. Fats and oils Butters. Beverages Soft drinks. Other foods Cakes and pastries. The  items listed above may not be a complete list of foods and beverages to avoid. Talk with your dietitian about what choices are best for you. Summary Fiber is a type of carbohydrate. It is found in foods such as fruits, vegetables, whole grains, and beans. A high-fiber diet has many benefits. It can help to prevent constipation, lower blood cholesterol, aid weight loss, and reduce your risk of heart disease, diabetes, and certain cancers. Increase your intake of fiber gradually. Increasing fiber too quickly may cause cramping, bloating, and gas. Drink plenty of water while you increase the amount of fiber you consume. The best sources of fiber include whole fruits and vegetables, whole grains, nuts, seeds, and beans. This information is not intended to replace advice given to you by your health care provider. Make sure you discuss any questions you have with your health care provider. Document Revised: 12/02/2019 Document Reviewed: 12/02/2019 Elsevier Patient Education  2022 [employer]    Sciatica Rehab Ask your health care provider which exercises are safe for you. Do exercises exactly as told by your health care provider and adjust them as directed. It is  normal to feel mild stretching, pulling, tightness, or discomfort as you do these exercises. Stop right away if you feel sudden pain or your pain gets worse. Do not begin these exercises until told by your health care provider. Stretching and range-of-motion exercises These exercises warm up your muscles and joints and improve the movement and flexibility of your hips and back. These exercises also help to relieve pain, numbness, and tingling. Sciatic nerve glide Sit in a chair with your head facing down toward your chest. Place your hands behind your back. Let your shoulders slump forward. Slowly straighten one of your legs while you tilt your head back as if you are looking toward the ceiling. Only straighten your leg as far as you can  without making your symptoms worse. Hold this position for __________ seconds. Slowly return to the starting position. Repeat with your other leg. Repeat __________ times. Complete this exercise __________ times a day. Knee to chest with hip adduction and internal rotation  Lie on your back on a firm surface with both legs straight. Bend one of your knees and move it up toward your chest until you feel a gentle stretch in your lower back and buttock. Then, move your knee toward the shoulder that is on the opposite side from your leg. This is hip adduction and internal rotation. Hold your leg in this position by holding on to the front of your knee. Hold this position for __________ seconds. Slowly return to the starting position. Repeat with your other leg. Repeat __________ times. Complete this exercise __________ times a day. Prone extension on elbows  Lie on your abdomen on a firm surface. A bed may be too soft for this exercise. Prop yourself up on your elbows. Use your arms to help lift your chest up until you feel a gentle stretch in your abdomen and your lower back. This will place some of your body weight on your elbows. If this is uncomfortable, try stacking pillows under your chest. Your hips should stay down, against the surface that you are lying on. Keep your hip and back muscles relaxed. Hold this position for __________ seconds. Slowly relax your upper body and return to the starting position. Repeat __________ times. Complete this exercise __________ times a day. Strengthening exercises These exercises build strength and endurance in your back. Endurance is the ability to use your muscles for a long time, even after they get tired. Pelvic tilt This exercise strengthens the muscles that lie deep in the abdomen. Lie on your back on a firm surface. Bend your knees and keep your feet flat on the floor. Tense your abdominal muscles. Tip your pelvis up toward the ceiling and  flatten your lower back into the floor. To help with this exercise, you may place a small towel under your lower back and try to push your back into the towel. Hold this position for __________ seconds. Let your muscles relax completely before you repeat this exercise. Repeat __________ times. Complete this exercise __________ times a day. Alternating arm and leg raises  Get on your hands and knees on a firm surface. If you are on a hard floor, you may want to use padding, such as an exercise mat, to cushion your knees. Line up your arms and legs. Your hands should be directly below your shoulders, and your knees should be directly below your hips. Lift your left leg behind you. At the same time, raise your right arm and straighten it in front of  you. Do not lift your leg higher than your hip. Do not lift your arm higher than your shoulder. Keep your abdominal and back muscles tight. Keep your hips facing the ground. Do not arch your back. Keep your balance carefully, and do not hold your breath. Hold this position for __________ seconds. Slowly return to the starting position. Repeat with your right leg and your left arm. Repeat __________ times. Complete this exercise __________ times a day. Posture and body mechanics Good posture and healthy body mechanics can help to relieve stress in your body's tissues and joints. Body mechanics refers to the movements and positions of your body while you do your daily activities. Posture is part of body mechanics. Good posture means: Your spine is in its natural S-curve position (neutral). Your shoulders are pulled back slightly. Your head is not tipped forward. Follow these guidelines to improve your posture and body mechanics in your everyday activities. Standing  When standing, keep your spine neutral and your feet about hip width apart. Keep a slight bend in your knees. Your ears, shoulders, and hips should line up. When you do a task in which  you stand in one place for a long time, place one foot up on a stable object that is 2-4 inches (5-10 cm) high, such as a footstool. This helps keep your spine neutral. Sitting  When sitting, keep your spine neutral and keep your feet flat on the floor. Use a footrest, if necessary, and keep your thighs parallel to the floor. Avoid rounding your shoulders, and avoid tilting your head forward. When working at a desk or a computer, keep your desk at a height where your hands are slightly lower than your elbows. Slide your chair under your desk so you are close enough to maintain good posture. When working at a computer, place your monitor at a height where you are looking straight ahead and you do not have to tilt your head forward or downward to look at the screen. Resting When lying down and resting, avoid positions that are most painful for you. If you have pain with activities such as sitting, bending, stooping, or squatting, lie in a position in which your body does not bend very much. For example, avoid curling up on your side with your arms and knees near your chest (fetal position). If you have pain with activities such as standing for a long time or reaching with your arms, lie with your spine in a neutral position and bend your knees slightly. Try the following positions: Lying on your side with a pillow between your knees. Lying on your back with a pillow under your knees. Lifting  When lifting objects, keep your feet at least shoulder width apart and tighten your abdominal muscles. Bend your knees and hips and keep your spine neutral. It is important to lift using the strength of your legs, not your back. Do not lock your knees straight out. Always ask for help to lift heavy or awkward objects. This information is not intended to replace advice given to you by your health care provider. Make sure you discuss any questions you have with your health care provider. Document Revised:  11/20/2018 Document Reviewed: 08/20/2018 Elsevier Patient Education  2022 [employer].

## 2021-07-26 ENCOUNTER — Other Ambulatory Visit: Payer: Self-pay | Admitting: Adult Health

## 2021-07-26 LAB — LIPID PANEL
Cholesterol: 183 mg/dL (ref <200)
HDL: 55 mg/dL (ref >=50)
LDL Cholesterol (Calc): 106 mg/dL (calc) — ABNORMAL HIGH
Non-HDL Cholesterol (Calc): 128 mg/dL (calc) (ref <130)
Total CHOL/HDL Ratio: 3.3 (calc) (ref <5.0)
Triglycerides: 127 mg/dL (ref <150)

## 2021-07-26 LAB — COMPLETE METABOLIC PANEL WITH GFR
AG Ratio: 1.3 (calc) (ref 1.0–2.5)
ALT: 51 U/L — ABNORMAL HIGH (ref 6–29)
AST: 30 U/L (ref 10–35)
Albumin: 4.3 g/dL (ref 3.6–5.1)
Alkaline phosphatase (APISO): 53 U/L (ref 31–125)
BUN: 8 mg/dL (ref 7–25)
CO2: 30 mmol/L (ref 20–32)
Calcium: 9.7 mg/dL (ref 8.6–10.2)
Chloride: 100 mmol/L (ref 98–110)
Creat: 0.57 mg/dL (ref 0.50–0.99)
Globulin: 3.4 g/dL (calc) (ref 1.9–3.7)
Glucose, Bld: 85 mg/dL (ref 65–99)
Potassium: 3.7 mmol/L (ref 3.5–5.3)
Sodium: 138 mmol/L (ref 135–146)
Total Bilirubin: 0.5 mg/dL (ref 0.2–1.2)
Total Protein: 7.7 g/dL (ref 6.1–8.1)
eGFR: 111 mL/min/{1.73_m2} (ref >=60)

## 2021-07-26 LAB — CBC WITH DIFFERENTIAL/PLATELET
Absolute Monocytes: 434 cells/uL (ref 200–950)
Basophils Absolute: 31 cells/uL (ref 0–200)
Basophils Relative: 0.6 %
Eosinophils Absolute: 143 cells/uL (ref 15–500)
Eosinophils Relative: 2.8 %
HCT: 38.8 % (ref 35.0–45.0)
Hemoglobin: 12.4 g/dL (ref 11.7–15.5)
Lymphs Abs: 1989 cells/uL (ref 850–3900)
MCH: 27.3 pg (ref 27.0–33.0)
MCHC: 32 g/dL (ref 32.0–36.0)
MCV: 85.3 fL (ref 80.0–100.0)
MPV: 10.1 fL (ref 7.5–12.5)
Monocytes Relative: 8.5 %
Neutro Abs: 2504 cells/uL (ref 1500–7800)
Neutrophils Relative %: 49.1 %
Platelets: 310 10*3/uL (ref 140–400)
RBC: 4.55 10*6/uL (ref 3.80–5.10)
RDW: 13 % (ref 11.0–15.0)
Total Lymphocyte: 39 %
WBC: 5.1 10*3/uL (ref 3.8–10.8)

## 2021-07-26 LAB — HEMOGLOBIN A1C
Hgb A1c MFr Bld: 5.5 % of total Hgb (ref <5.7)
Mean Plasma Glucose: 111 mg/dL
eAG (mmol/L): 6.2 mmol/L

## 2021-07-26 LAB — TSH: TSH: 1.42 mIU/L

## 2021-07-26 LAB — MICROALBUMIN / CREATININE URINE RATIO
Creatinine, Urine: 42 mg/dL (ref 20–275)
Microalb Creat Ratio: 17 mcg/mg creat (ref <30)
Microalb, Ur: 0.7 mg/dL

## 2021-07-26 LAB — URINALYSIS, ROUTINE W REFLEX MICROSCOPIC
Bilirubin Urine: NEGATIVE
Glucose, UA: NEGATIVE
Hgb urine dipstick: NEGATIVE
Ketones, ur: NEGATIVE
Leukocytes,Ua: NEGATIVE
Nitrite: NEGATIVE
Protein, ur: NEGATIVE
Specific Gravity, Urine: 1.012 (ref 1.001–1.035)
pH: 7 (ref 5.0–8.0)

## 2021-07-26 LAB — IRON,TIBC AND FERRITIN PANEL
%SAT: 66 % (calc) — ABNORMAL HIGH (ref 16–45)
Ferritin: 25 ng/mL (ref 16–232)
Iron: 266 ug/dL — ABNORMAL HIGH (ref 40–190)
TIBC: 403 mcg/dL (calc) (ref 250–450)

## 2021-07-26 LAB — MAGNESIUM: Magnesium: 2 mg/dL (ref 1.5–2.5)

## 2021-07-26 MED ORDER — ROSUVASTATIN CALCIUM 5 MG PO TABS: ORAL_TABLET | ORAL | 3 refills | Status: AC

## 2021-07-27 NOTE — Progress Notes (Signed)
Lvm for pt to call back to schedule follow up visit.

## 2021-11-01 NOTE — Progress Notes (Deleted)
4 MONTH FOLLOW P  Assessment and Plan:   Essential hypertension  - continue medications, DASH diet, exercise and monitor at home. Call if greater than 130/80.  - consider switch HCTZ to dyazide if hypokalemia  Morbid obesity (HCC) - BMI 35+ with htn, hyperlipidemia, prediabetes - long discussion about weight loss, diet, and exercise -recommended diet heavy in fruits and veggies and low in animal meats, cheeses, and dairy products - weight next weight goal <185 lb, limit processed carbs, encouraged high fiber whole foods, weigh weekly to monitor *** -     TSH  Mixed hyperlipidemia -LDL goal <100; newly on rosuvastatin from pravastatin  -check lipids, decrease fatty foods, increase activity.  -     Lipid panel -     CMP/GFR  Prediabetes Controlled on metformin  Discussed disease and risks Discussed diet/exercise, weight management  A1C q22m   Discussed med's effects and SE's. Labs and tests as requested with regular follow-up as recommended. 30 min spent with patient reviewing chart, counseling, examining and moderate decision making.   Future Appointments  Date Time Provider Department Center  11/02/2021  9:30 AM Judd Gaudier, NP [facility] None  01/30/2022  9:30 AM Judd Gaudier, NP [facility] None  07/25/2022  9:00 AM Judd Gaudier, NP [facility] None    HPI  51 y.o. AA female  presents for 3 month follow up for htn, lipids, prediabetes, morbid obesity, vitamin D def.   She has a history of asthma, hasn't had problems recently, may have some difficulty on very hot humid days.   She has had some GERD, tx with famotidine   BMI is There is no height or weight on file to calculate BMI., she has not been working on diet and exercise.  She works physically active job 5 days a week, on her feet with lots of lifting. She reports has reduced soda, down to 0-1/2 bottle, tries to drink mostly water. No alcohol. Does admit to fast food, ~3 days per week.  Hepatic steatosis per  [place] 08/2020.  Wt Readings from Last 3 Encounters:  07/25/21 197 lb (89.4 kg)  01/18/21 196 lb (88.9 kg)  07/24/20 195 lb (88.5 kg)   Her blood pressure has been controlled at home, today their BP is  .  She does not workout. She denies chest pain, shortness of breath, dizziness. Echo 2014, mild LVH, normal EF 55-60%.  She is on cholesterol medication (newly on rosuvastatin 5 mg from pravastatin 40 mg) and denies myalgias. Her cholesterol is not at goal. The cholesterol last visit was:  Lab Results  Component Value Date   CHOL 183 07/25/2021   HDL 55 07/25/2021   LDLCALC 106 (H) 07/25/2021   TRIG 127 07/25/2021   CHOLHDL 3.3 07/25/2021  . She has been working on diet and exercise for prediabetes which has been controlled with oral metformin 500 mg BID, she is not on bASA, she is on ACE/ARB and denies foot ulcerations, hyperglycemia, hypoglycemia , increased appetite, nausea, paresthesia of the feet, polydipsia, polyuria, visual disturbances, vomiting and weight loss. Last A1C in the office was:  Lab Results  Component Value Date   HGBA1C 5.5 07/25/2021   Lab Results  Component Value Date   GFRAA 121 01/18/2021   Patient is on Vitamin D supplement, has been taking more regularly, 5000 IU ?  Lab Results  Component Value Date   VD25OH 57 01/18/2021      Current Medications:  Current Outpatient Medications on File Prior to Visit  Medication Sig Dispense Refill   albuterol (VENTOLIN HFA) 108 (90 Base) MCG/ACT inhaler Inhale 2 puffs into the lungs every 4 (four) hours as needed for wheezing or shortness of breath. 1 Inhaler 0   amLODipine (NORVASC) 10 MG tablet TAKE 1 TABLET BY MOUTH EVERY MORNING FOR BLOOD PRESSURE 90 tablet 1   benazepril (LOTENSIN) 40 MG tablet TAKE 1 TABLET BY MOUTH DAILY FOR BLOOD PRESSURE 90 tablet 1   bisoprolol (ZEBETA) 10 MG tablet Take 1 tablet (10 mg total) by mouth daily. 90 tablet 1   Cholecalciferol ([employer] D3) 125 MCG (5000 UT) capsule Take 1 cap daily. 100  capsule 3   Esomeprazole Magnesium (NEXIUM PO) Take 1 capsule by mouth daily. Taking OTC at night PRN     famotidine (PEPCID) 20 MG tablet Take 1 tab twice daily prior to breakfast and in the evening for reflux. 180 tablet 3   Ferrous Fumarate (FERROCITE) 324 (106 Fe) MG TABS tablet TAKE 1 TABLET DAILY FOR ANEMIA 90 tablet 1   hydrochlorothiazide (HYDRODIURIL) 25 MG tablet TAKE 1 TABLET BY MOUTH DAILY FOR BP, FLUID RETENTION & ANKLE SWELLING 90 tablet 1   ibuprofen (ADVIL,MOTRIN) 600 MG tablet Take 1 tablet (600 mg total) by mouth every 6 (six) hours as needed. 30 tablet 0   meloxicam (MOBIC) 15 MG tablet Take 1/2-1 tab once daily as needed for pain and inflammation. Do not take on empty stomach or with ibuprofen or aleve. 30 tablet 1   metFORMIN (GLUCOPHAGE-XR) 500 MG 24 hr tablet Take  2 tablets  2 x /day  with Meals for Diabetes         /TAKE 1 TO 2 TABLETS BY MOUTH TWICE A DAY WITH MEALS 360 tablet 3   methocarbamol (ROBAXIN) 500 MG tablet Take 1 tablet (500 mg total) by mouth every 6 (six) hours as needed for muscle spasms. 40 tablet 1   rosuvastatin (CRESTOR) 5 MG tablet Take 1 tab daily for cholesterol. 90 tablet 3   No current facility-administered medications on file prior to visit.    Medical History:  Past Medical History:  Diagnosis Date   Allergic rhinitis    Asthma    DM (diabetes mellitus) (HCC)    borderline   High cholesterol    Hypertension    Iron deficiency anemia    Low blood potassium    Allergies Allergies  Allergen Reactions   Tussionex Pennkinetic Er [Hydrocod Poli-Chlorphe Poli Er]     causes headache    SURGICAL HISTORY She  has a past surgical history that includes Cesarean section; Umbilical hernia repair (1977); and Tubal ligation (1996). FAMILY HISTORY Her family history includes Breast cancer in her maternal aunt; Diabetes in her father, maternal aunt, and mother; Heart disease in her maternal grandmother; Mitral valve prolapse in her mother;  Pancreatic cancer in her maternal uncle; Stroke in her mother. SOCIAL HISTORY She  reports that she has never smoked. She has never used smokeless tobacco. She reports that she does not drink alcohol and does not use drugs.   Review of Systems:  Review of Systems  Constitutional:  Negative for chills, fever and malaise/fatigue.  HENT:  Negative for congestion, ear pain and sore throat.   Eyes: Negative.   Respiratory:  Negative for cough, shortness of breath and wheezing.   Cardiovascular:  Negative for chest pain, palpitations and leg swelling.  Gastrointestinal:  Negative for abdominal pain, blood in stool, constipation, diarrhea, heartburn and melena.  Genitourinary: Negative.   Musculoskeletal:  Positive for joint pain (R knee, mild intermittent). Negative for back pain and falls.  Skin: Negative.   Neurological:  Negative for dizziness, sensory change, loss of consciousness and headaches.  Psychiatric/Behavioral:  Negative for depression. The patient is not nervous/anxious and does not have insomnia.    Physical Exam: Estimated body mass index is 38.47 kg/m as calculated from the following:   Height as of 07/25/21: 5' (1.524 m).   Weight as of 07/25/21: 197 lb (89.4 kg). There were no vitals taken for this visit.  General Appearance: Well nourished well developed, obese AA female, in no apparent distress.  Eyes: PERRLA, EOMs, conjunctiva no swelling or erythema ENT/Mouth: Ear canals normal without obstruction, swelling, erythema, or discharge.  TMs normal bilaterally with no erythema, bulging, retraction, or loss of landmark.  Oropharynx moist and clear with no exudate, erythema, or swelling.   Neck: Supple, thyroid normal. No bruits.  No cervical adenopathy Respiratory: Respiratory effort normal, Breath sounds clear A&P without wheeze, rhonchi, rales.   Cardio: RRR without murmurs, rubs or gallops. Brisk peripheral pulses without edema.  Abdomen: Soft, nontender, no guarding,  rebound, hernias, masses, or organomegaly.  Lymphatics: Non tender without lymphadenopathy.  Musculoskeletal: Full ROM all peripheral extremities,5/5 strength, and normal gait. No effusion, crepitus, laxity.  Skin: Warm, dry without rashes, lesions, ecchymosis. Neuro: Awake and oriented X 3, Cranial nerves intact, reflexes equal bilaterally. Normal muscle tone, no cerebellar symptoms. Sensation intact.  Psych:  normal affect, Insight and Judgment appropriate.    Dan MakerAshley C Izabelle Daus, DNP, AGNP-C 5:26 PM [facility]

## 2021-11-02 ENCOUNTER — Ambulatory Visit: Payer: BLUE CROSS/BLUE SHIELD | Admitting: Adult Health

## 2021-11-02 DIAGNOSIS — R7309 Other abnormal glucose: Secondary | ICD-10-CM

## 2021-11-02 DIAGNOSIS — I10 Essential (primary) hypertension: Secondary | ICD-10-CM

## 2021-11-02 DIAGNOSIS — K760 Fatty (change of) liver, not elsewhere classified: Secondary | ICD-10-CM

## 2021-11-02 DIAGNOSIS — E782 Mixed hyperlipidemia: Secondary | ICD-10-CM

## 2021-11-03 ENCOUNTER — Other Ambulatory Visit: Payer: Self-pay | Admitting: Adult Health

## 2021-12-17 DIAGNOSIS — S335XXA Sprain of ligaments of lumbar spine, initial encounter: Secondary | ICD-10-CM | POA: Diagnosis not present

## 2021-12-17 DIAGNOSIS — R109 Unspecified abdominal pain: Secondary | ICD-10-CM | POA: Diagnosis not present

## 2021-12-17 DIAGNOSIS — X58XXXA Exposure to other specified factors, initial encounter: Secondary | ICD-10-CM | POA: Diagnosis not present

## 2021-12-28 ENCOUNTER — Encounter: Payer: Self-pay | Admitting: Adult Health

## 2022-01-30 ENCOUNTER — Ambulatory Visit: Payer: Self-pay | Admitting: Adult Health

## 2022-01-30 DIAGNOSIS — K219 Gastro-esophageal reflux disease without esophagitis: Secondary | ICD-10-CM | POA: Insufficient documentation

## 2022-01-30 NOTE — Progress Notes (Deleted)
6 MONTH FOLLOW UP  Assessment and Plan:   Essential hypertension  - continue medications, DASH diet, exercise and monitor at home. Call if greater than 130/80.  - consider switch HCTZ to dyazide if hypokalemia *** -     CBC with Differential/Platelet -     CMP/GFR -     TSH  Morbid obesity (HCC) - BMI 35+ with htn, hyperlipidemia, prediabetes - long discussion about weight loss, diet, and exercise -recommended diet heavy in fruits and veggies and low in animal meats, cheeses, and dairy products - goal set to reduce fast food, eat more beans, 10 lb weight loss for next OV *** -     TSH  Mixed hyperlipidemia -LDL goal <100; plan to switch to rosuvastatin *** -check lipids, decrease fatty foods, increase activity.  -     Lipid panel  Prediabetes Controlled on metformin  Discussed disease and risks Discussed diet/exercise, weight management  A1C  Medication management -     Magnesium  Iron deficiency anemia, unspecified iron deficiency anemia type Reports history of similar with neg GI workup, was referred to hematology Likely secondary to menorrhagia Check CBC, iron She has been taking supplement more regularly  -     Iron, Total/Total Iron Binding Cap -     CBC  Vitamin D deficiency -     VITAMIN D 25 Hydroxy (Vit-D Deficiency, Fractures)   Discussed med's effects and SE's. Labs and tests as requested with regular follow-up as recommended. 30 min spent with patient reviewing chart, counseling, examining and moderate decision making.   Future Appointments  Date Time Provider [place]  01/30/2022  9:30 AM Liane Comber, NP [facility] None  07/25/2022  9:00 AM Darrol Jump, NP [facility] None    HPI  51 y.o. AA female  presents for 6 month follow up for htn, lipids, prediabetes, morbid obesity, vitamin D def.    She has had bil sciatica, referred to Dr. Ninfa Linden and had rassuring xrays, did meloxicam, muscle relaxers. Was much better but having more sx  recently in the morning, improves once she starts moving. Has mild intermittent R knee discomfort, manages with rest and ice.   She has a history of asthma, hasn't had problems recently, may have some difficulty on very hot humid days.   She has had some GERD, now on famotidine 20 mg, having some breakthrough with once daily, will having some burning with trigger foods.   BMI is There is no height or weight on file to calculate BMI., she has not been working on diet and exercise. She works physically active job 5 days a week, on her feet with lots of lifting. She reports has reduced soda, down to 0-1/2 bottle. No alcohol. Does admit to fast food, ~3 days per week.  Hepatic steatosis per [place] 08/2020.  Wt Readings from Last 3 Encounters:  07/25/21 197 lb (89.4 kg)  01/18/21 196 lb (88.9 kg)  07/24/20 195 lb (88.5 kg)   Her blood pressure has been controlled at home, today their BP is  .  She does not workout. She denies chest pain, shortness of breath, dizziness. Echo 2014, mild LVH, normal EF 55-60%.  She is on cholesterol medication (rosuvastatin 5 mg daily) and denies myalgias. Her cholesterol is at goal. The cholesterol last visit was:  Lab Results  Component Value Date   CHOL 183 07/25/2021   HDL 55 07/25/2021   LDLCALC 106 (H) 07/25/2021   TRIG 127 07/25/2021   CHOLHDL 3.3 07/25/2021  .  She has been working on diet and exercise for prediabetes which has been controlled with oral metformin 500 mg BID, she is not on bASA, she is on ACE/ARB and denies foot ulcerations, hyperglycemia, hypoglycemia , increased appetite, nausea, paresthesia of the feet, polydipsia, polyuria, visual disturbances, vomiting and weight loss. Last A1C in the office was:  Lab Results  Component Value Date   HGBA1C 5.5 07/25/2021   Lab Results  Component Value Date   EGFR 111 07/25/2021   Patient is on Vitamin D supplement, has been taking more regularly, 5000 IU ?  Lab Results  Component Value Date    VD25OH 57 01/18/2021     She has iron deficiency anemia, was on iron supplement but stopped; she reports has had similar event with iron deficiency in the past, saw GI Dr. Deatra Ina with reportedly normal workup, ended up seeing hematology. She did have negative hemoccult x 3 on 08/10/2019, was initiated on ferrous fumarate 324 mg BID. She endorses ongoing heavy menstrual cycles, unchanged. Hasn't had screening colonoscopy yet due to insurance.  Lab Results  Component Value Date   WBC 5.1 07/25/2021   HGB 12.4 07/25/2021   HCT 38.8 07/25/2021   MCV 85.3 07/25/2021   PLT 310 07/25/2021   Lab Results  Component Value Date   IRON 266 (H) 07/25/2021   TIBC 403 07/25/2021   FERRITIN 25 07/25/2021     Current Medications:  Current Outpatient Medications on File Prior to Visit  Medication Sig Dispense Refill   albuterol (VENTOLIN HFA) 108 (90 Base) MCG/ACT inhaler Inhale 2 puffs into the lungs every 4 (four) hours as needed for wheezing or shortness of breath. 1 Inhaler 0   amLODipine (NORVASC) 10 MG tablet TAKE 1 TABLET BY MOUTH EVERY MORNING FOR BLOOD PRESSURE 90 tablet 1   benazepril (LOTENSIN) 40 MG tablet TAKE 1 TABLET BY MOUTH DAILY FOR BLOOD PRESSURE 90 tablet 1   bisoprolol (ZEBETA) 10 MG tablet Take 1 tablet (10 mg total) by mouth daily. 90 tablet 1   Cholecalciferol ([employer] D3) 125 MCG (5000 UT) capsule Take 1 cap daily. 100 capsule 3   Esomeprazole Magnesium (NEXIUM PO) Take 1 capsule by mouth daily. Taking OTC at night PRN     famotidine (PEPCID) 20 MG tablet Take 1 tab twice daily prior to breakfast and in the evening for reflux. 180 tablet 3   Ferrous Fumarate (FERROCITE) 324 (106 Fe) MG TABS tablet TAKE 1 TABLET DAILY FOR ANEMIA 90 tablet 1   hydrochlorothiazide (HYDRODIURIL) 25 MG tablet TAKE 1 TABLET BY MOUTH DAILY FOR BP, FLUID RETENTION & ANKLE SWELLING 90 tablet 1   ibuprofen (ADVIL,MOTRIN) 600 MG tablet Take 1 tablet (600 mg total) by mouth every 6 (six) hours as needed. 30  tablet 0   meloxicam (MOBIC) 15 MG tablet Take 1/2-1 tab once daily as needed for pain and inflammation. Do not take on empty stomach or with ibuprofen or aleve. 30 tablet 1   metFORMIN (GLUCOPHAGE-XR) 500 MG 24 hr tablet Take  2 tablets  2 x /day  with Meals for Diabetes         /TAKE 1 TO 2 TABLETS BY MOUTH TWICE A DAY WITH MEALS 360 tablet 3   methocarbamol (ROBAXIN) 500 MG tablet Take 1 tablet (500 mg total) by mouth every 6 (six) hours as needed for muscle spasms. 40 tablet 1   rosuvastatin (CRESTOR) 5 MG tablet Take 1 tab daily for cholesterol. 90 tablet 3   No current facility-administered  medications on file prior to visit.    Medical History:  Past Medical History:  Diagnosis Date   Allergic rhinitis    Asthma    DM (diabetes mellitus) ([place])    borderline   High cholesterol    Hypertension    Iron deficiency anemia    Low blood potassium    Allergies Allergies  Allergen Reactions   Tussionex Pennkinetic Er [Hydrocod Poli-Chlorphe Poli Er]     causes headache    SURGICAL HISTORY She  has a past surgical history that includes Cesarean section; Umbilical hernia repair (1977); and Tubal ligation (1996). FAMILY HISTORY Her family history includes Breast cancer in her maternal aunt; Diabetes in her father, maternal aunt, and mother; Heart disease in her maternal grandmother; Mitral valve prolapse in her mother; Pancreatic cancer in her maternal uncle; Stroke in her mother. SOCIAL HISTORY She  reports that she has never smoked. She has never used smokeless tobacco. She reports that she does not drink alcohol and does not use drugs.   Review of Systems:  Review of Systems  Constitutional:  Negative for chills, fever and malaise/fatigue.  HENT:  Negative for congestion, ear pain and sore throat.   Eyes: Negative.   Respiratory:  Negative for cough, shortness of breath and wheezing.   Cardiovascular:  Negative for chest pain, palpitations and leg swelling.  Gastrointestinal:   Negative for abdominal pain, blood in stool, constipation, diarrhea, heartburn and melena.  Genitourinary: Negative.   Musculoskeletal:  Positive for joint pain (R knee, mild intermittent). Negative for back pain and falls.  Skin: Negative.   Neurological:  Negative for dizziness, sensory change, loss of consciousness and headaches.  Psychiatric/Behavioral:  Negative for depression. The patient is not nervous/anxious and does not have insomnia.     Physical Exam: Estimated body mass index is 38.47 kg/m as calculated from the following:   Height as of 07/25/21: 5' (1.524 m).   Weight as of 07/25/21: 197 lb (89.4 kg). There were no vitals taken for this visit.  General Appearance: Well nourished well developed, obese AA female, in no apparent distress.  Eyes: PERRLA, EOMs, conjunctiva no swelling or erythema ENT/Mouth: Ear canals normal without obstruction, swelling, erythema, or discharge.  TMs normal bilaterally with no erythema, bulging, retraction, or loss of landmark.  Oropharynx moist and clear with no exudate, erythema, or swelling.   Neck: Supple, thyroid normal. No bruits.  No cervical adenopathy Respiratory: Respiratory effort normal, Breath sounds clear A&P without wheeze, rhonchi, rales.   Cardio: RRR without murmurs, rubs or gallops. Brisk peripheral pulses without edema.  Abdomen: Soft, nontender, no guarding, rebound, hernias, masses, or organomegaly.  Lymphatics: Non tender without lymphadenopathy.  Musculoskeletal: Full ROM all peripheral extremities,5/5 strength, and normal gait. No effusion, crepitus, laxity.  Skin: Warm, dry without rashes, lesions, ecchymosis. Neuro: Awake and oriented X 3, Cranial nerves intact, reflexes equal bilaterally. Normal muscle tone, no cerebellar symptoms. Sensation intact.  Psych:  normal affect, Insight and Judgment appropriate.    Alexis Marquez 8:51 AM [facility]

## 2022-02-02 ENCOUNTER — Other Ambulatory Visit: Payer: Self-pay | Admitting: Nurse Practitioner

## 2022-02-02 DIAGNOSIS — I10 Essential (primary) hypertension: Secondary | ICD-10-CM

## 2022-03-01 ENCOUNTER — Other Ambulatory Visit: Payer: Self-pay | Admitting: Nurse Practitioner

## 2022-03-01 DIAGNOSIS — I10 Essential (primary) hypertension: Secondary | ICD-10-CM

## 2022-05-03 DIAGNOSIS — E569 Vitamin deficiency, unspecified: Secondary | ICD-10-CM | POA: Diagnosis not present

## 2022-05-03 DIAGNOSIS — Z1329 Encounter for screening for other suspected endocrine disorder: Secondary | ICD-10-CM | POA: Diagnosis not present

## 2022-05-03 DIAGNOSIS — D649 Anemia, unspecified: Secondary | ICD-10-CM | POA: Diagnosis not present

## 2022-05-03 DIAGNOSIS — Z1211 Encounter for screening for malignant neoplasm of colon: Secondary | ICD-10-CM | POA: Diagnosis not present

## 2022-05-03 DIAGNOSIS — E785 Hyperlipidemia, unspecified: Secondary | ICD-10-CM | POA: Diagnosis not present

## 2022-05-03 DIAGNOSIS — E119 Type 2 diabetes mellitus without complications: Secondary | ICD-10-CM | POA: Diagnosis not present

## 2022-05-03 DIAGNOSIS — I152 Hypertension secondary to endocrine disorders: Secondary | ICD-10-CM | POA: Diagnosis not present

## 2022-05-03 DIAGNOSIS — E1169 Type 2 diabetes mellitus with other specified complication: Secondary | ICD-10-CM | POA: Diagnosis not present

## 2022-05-03 DIAGNOSIS — E1159 Type 2 diabetes mellitus with other circulatory complications: Secondary | ICD-10-CM | POA: Diagnosis not present

## 2022-05-04 ENCOUNTER — Other Ambulatory Visit: Payer: Self-pay | Admitting: Internal Medicine

## 2022-05-04 ENCOUNTER — Other Ambulatory Visit: Payer: Self-pay | Admitting: Nurse Practitioner

## 2022-06-28 DIAGNOSIS — E569 Vitamin deficiency, unspecified: Secondary | ICD-10-CM | POA: Diagnosis not present

## 2022-06-28 DIAGNOSIS — D649 Anemia, unspecified: Secondary | ICD-10-CM | POA: Diagnosis not present

## 2022-06-28 DIAGNOSIS — Z1329 Encounter for screening for other suspected endocrine disorder: Secondary | ICD-10-CM | POA: Diagnosis not present

## 2022-06-28 DIAGNOSIS — E119 Type 2 diabetes mellitus without complications: Secondary | ICD-10-CM | POA: Diagnosis not present

## 2022-07-25 ENCOUNTER — Emergency Department (HOSPITAL_COMMUNITY)
Admission: EM | Admit: 2022-07-25 | Discharge: 2022-07-25 | Disposition: A | Discharge status: Home / Self Care | Payer: BC Managed Care – PPO | Attending: Student | Admitting: Student

## 2022-07-25 ENCOUNTER — Encounter: Payer: Self-pay | Admitting: Nurse Practitioner

## 2022-07-25 ENCOUNTER — Encounter (HOSPITAL_COMMUNITY): Payer: Self-pay | Admitting: Emergency Medicine

## 2022-07-25 DIAGNOSIS — J069 Acute upper respiratory infection, unspecified: Secondary | ICD-10-CM | POA: Insufficient documentation

## 2022-07-25 DIAGNOSIS — Z20822 Contact with and (suspected) exposure to covid-19: Secondary | ICD-10-CM | POA: Diagnosis not present

## 2022-07-25 DIAGNOSIS — Z79899 Other long term (current) drug therapy: Secondary | ICD-10-CM | POA: Insufficient documentation

## 2022-07-25 DIAGNOSIS — I10 Essential (primary) hypertension: Secondary | ICD-10-CM | POA: Diagnosis not present

## 2022-07-25 DIAGNOSIS — R059 Cough, unspecified: Secondary | ICD-10-CM | POA: Diagnosis not present

## 2022-07-25 LAB — RESP PANEL BY RT-PCR (RSV, FLU A&B, COVID)  RVPGX2
Influenza A by PCR: POSITIVE — AB
Influenza B by PCR: NEGATIVE
Resp Syncytial Virus by PCR: NEGATIVE
SARS Coronavirus 2 by RT PCR: NEGATIVE

## 2022-07-25 MED ORDER — BENZONATATE 100 MG PO CAPS: 100.0000 mg | ORAL_CAPSULE | Freq: Three times a day (TID) | ORAL | 0 refills | Status: AC

## 2022-07-25 NOTE — Discharge Instructions (Signed)
Likely a viral infection, recommend over-the-counter pain medications like ibuprofen Tylenol for fever and pain control, nasal decongestions like Flonase and Zyrtec, Mucinex for cough.  If not eating recommend supplementing with Gatorade to help with electrolyte supplementation.  You were COVID and flu tests are pending, if your COVID test is positive I will send in the antiviral treatmen to the pharmacy([facility] on [address]) you to find the results on MyChart within the next 2 hours.  If you are COVID-positive I would like you to self quarantine for 5 days starting on symptom onset.  Follow-up PCP for further evaluation.  Come back to the emergency department if you develop chest pain, shortness of breath, severe abdominal pain, uncontrolled nausea, vomiting, diarrhea.

## 2022-07-25 NOTE — ED Provider Notes (Signed)
[facility] EMERGENCY DEPARTMENT Provider Note   CSN: [id number] Arrival date & time: 07/25/22  0041     History  Chief Complaint  Patient presents with   Cough    Alexis Marquez is a 51 y.o. female.  HPI   Patient with medical history including hypertension, prediabetes, presents with complaints of URI-like symptoms, symptoms started today, she endorses a cough, without production, no fevers no chills no general body aches, denies any shortness of breath or chest pain.  She is vaccine against COVID influenza, she is not immunocompromise, she has no other complaints.  She states she just needs help with her cough.   Home Medications Prior to Admission medications   Medication Sig Start Date End Date Taking? Authorizing Provider  benzonatate (TESSALON) 100 MG capsule Take 1 capsule (100 mg total) by mouth every 8 (eight) hours. 07/25/22  Yes Carroll Sage, PA-C  albuterol (VENTOLIN HFA) 108 (90 Base) MCG/ACT inhaler Inhale 2 puffs into the lungs every 4 (four) hours as needed for wheezing or shortness of breath. 06/22/18   Judd Gaudier, NP  amLODipine (NORVASC) 10 MG tablet TAKE 1 TABLET BY MOUTH EVERY MORNING FOR BLOOD PRESSURE 11/03/21   Raynelle Dick, NP  benazepril (LOTENSIN) 40 MG tablet TAKE 1 TABLET BY MOUTH DAILY FOR BLOOD PRESSURE 10/16/20   Judd Gaudier, NP  bisoprolol (ZEBETA) 10 MG tablet Take 1 tablet (10 mg total) by mouth daily. 04/21/21   Judd Gaudier, NP  Cholecalciferol ([employer] D3) 125 MCG (5000 UT) capsule Take 1 cap daily. 07/25/21   Judd Gaudier, NP  Esomeprazole Magnesium (NEXIUM PO) Take 1 capsule by mouth daily. Taking OTC at night PRN    [provider]  famotidine (PEPCID) 20 MG tablet Take 1 tab twice daily prior to breakfast and in the evening for reflux. 07/25/21   Judd Gaudier, NP  Ferrous Fumarate (FERROCITE) 324 (106 Fe) MG TABS tablet TAKE 1 TABLET DAILY FOR ANEMIA 01/19/21   Judd Gaudier, NP   hydrochlorothiazide (HYDRODIURIL) 25 MG tablet TAKE 1 TABLET BY MOUTH DAILY FOR BP, FLUID RETENTION & ANKLE SWELLING 02/04/22   Raynelle Dick, NP  ibuprofen (ADVIL,MOTRIN) 600 MG tablet Take 1 tablet (600 mg total) by mouth every 6 (six) hours as needed. 03/03/18   Fayrene Helper, PA-C  meloxicam (MOBIC) 15 MG tablet Take 1/2-1 tab once daily as needed for pain and inflammation. Do not take on empty stomach or with ibuprofen or aleve. 07/25/21   Judd Gaudier, NP  metFORMIN (GLUCOPHAGE-XR) 500 MG 24 hr tablet Take  2 tablets  2 x /day  with Meals for Diabetes         /TAKE 1 TO 2 TABLETS BY MOUTH TWICE A DAY WITH MEALS 05/13/21   Lucky Cowboy, MD  methocarbamol (ROBAXIN) 500 MG tablet Take 1 tablet (500 mg total) by mouth every 6 (six) hours as needed for muscle spasms. 07/25/21   Judd Gaudier, NP  rosuvastatin (CRESTOR) 5 MG tablet Take 1 tab daily for cholesterol. 07/26/21   Judd Gaudier, NP      Allergies    Tussionex pennkinetic er Larey Seat poli-chlorphe poli er]    Review of Systems   Review of Systems  Constitutional:  Negative for chills and fever.  Respiratory:  Positive for cough. Negative for shortness of breath.   Cardiovascular:  Negative for chest pain.  Gastrointestinal:  Negative for abdominal pain.  Neurological:  Negative for headaches.    Physical Exam Updated Vital Signs  There were no vitals taken for this visit. Physical Exam Vitals and nursing note reviewed.  Constitutional:      General: She is not in acute distress.    Appearance: She is not ill-appearing.  HENT:     Head: Normocephalic and atraumatic.     Nose: No congestion.  Eyes:     Conjunctiva/sclera: Conjunctivae normal.  Cardiovascular:     Rate and Rhythm: Normal rate and regular rhythm.     Pulses: Normal pulses.     Heart sounds: No murmur heard.    No friction rub. No gallop.  Pulmonary:     Effort: No respiratory distress.     Breath sounds: No wheezing, rhonchi or rales.      Comments: Lung sounds clear bilaterally, no evidence of respiratory distress. Skin:    General: Skin is warm and dry.  Neurological:     Mental Status: She is alert.  Psychiatric:        Mood and Affect: Mood normal.     ED Results / Procedures / Treatments   Labs (all labs ordered are listed, but only abnormal results are displayed) Labs Reviewed  RESP PANEL BY RT-PCR (RSV, FLU A&B, COVID)  [id number]    EKG None  Radiology No results found.  Procedures Procedures    Medications Ordered in ED Medications - No data to display  ED Course/ Medical Decision Making/ A&P                           Medical Decision Making  This patient presents to the ED for concern of URI, this involves an extensive number of treatment options, and is a complaint that carries with it a high risk of complications and morbidity.  The differential diagnosis includes pneumonia, PE, asthma    Additional history obtained:  Additional history obtained from N/A External records from outside source obtained and reviewed including primary care notes   Co morbidities that complicate the patient evaluation  Hypertension, prediabetes  Social Determinants of Health:  N/A    Lab Tests:  I Ordered, and personally interpreted labs.  The pertinent results include: Respiratory panel   Imaging Studies ordered:  I ordered imaging studies including N/A I independently visualized and interpreted imaging which showed N/A I agree with the radiologist interpretation   Cardiac Monitoring:  The patient was maintained on a cardiac monitor.  I personally viewed and interpreted the cardiac monitored which showed an underlying rhythm of: N/A   Medicines ordered and prescription drug management:  I ordered medication including N/A I have reviewed the patients home medicines and have made adjustments as needed  Critical Interventions:  N/a   Reevaluation:  Presents with URI, benign physical  exam, agreement with plan and discharge.  Consultations Obtained:  N/A    Test Considered:  Chest x-ray-deferred as my suspicion for pneumonia is low at this time symptoms discharged today, lung sounds are clear bilaterally.    Rule out Low suspicion for systemic infection as patient is nontoxic-appearing, vital signs reassuring, no obvious source infection noted on exam.  Low suspicion for pneumonia as lung sounds are clear bilaterally.  I have low suspicion for PE as patient denies pleuritic chest pain, shortness of breath, patient is PERC. low suspicion for strep throat as oropharynx was visualized, no erythema or exudates noted.  Low suspicion patient would need  hospitalized due to viral infection or Covid as vital signs reassuring, patient is not in  respiratory distress.     Dispostion and problem list  After consideration of the diagnostic results and the patients response to treatment, I feel that the patent would benefit from DC.  URI-likely viral nature, respiratory panel is pending, I had a discussion with the patient about antiviral treatment with shared decision making will defer on Tamiflu as she is not immunocompromise obtain her influenza vaccine, patient is positive for COVID will send in antivirals for this.            Final Clinical Impression(s) / ED Diagnoses Final diagnoses:  Upper respiratory tract infection, unspecified type    Rx / DC Orders ED Discharge Orders          Ordered    benzonatate (TESSALON) 100 MG capsule  Every 8 hours        07/25/22 0204              Carroll Sage, PA-C 07/25/22 0207    Melene Plan, DO 07/25/22 [phone]

## 2022-07-26 DIAGNOSIS — Z7189 Other specified counseling: Secondary | ICD-10-CM | POA: Diagnosis not present

## 2022-07-26 DIAGNOSIS — Z0000 Encounter for general adult medical examination without abnormal findings: Secondary | ICD-10-CM | POA: Diagnosis not present

## 2022-07-26 DIAGNOSIS — R052 Subacute cough: Secondary | ICD-10-CM | POA: Diagnosis not present
